# Patient Record
Sex: Female | Born: 1950
Health system: Southern US, Community
[De-identification: ages and names within clinical notes are randomized; demographics above are authoritative.]

## PROBLEM LIST (undated history)

## (undated) DIAGNOSIS — E079 Disorder of thyroid, unspecified: Secondary | ICD-10-CM

## (undated) DIAGNOSIS — J45909 Unspecified asthma, uncomplicated: Secondary | ICD-10-CM

## (undated) DIAGNOSIS — G4730 Sleep apnea, unspecified: Secondary | ICD-10-CM

## (undated) DIAGNOSIS — E119 Type 2 diabetes mellitus without complications: Secondary | ICD-10-CM

## (undated) DIAGNOSIS — E785 Hyperlipidemia, unspecified: Secondary | ICD-10-CM

## (undated) DIAGNOSIS — Z8742 Personal history of other diseases of the female genital tract: Secondary | ICD-10-CM

## (undated) DIAGNOSIS — L409 Psoriasis, unspecified: Secondary | ICD-10-CM

## (undated) DIAGNOSIS — E559 Vitamin D deficiency, unspecified: Secondary | ICD-10-CM

## (undated) DIAGNOSIS — I10 Essential (primary) hypertension: Secondary | ICD-10-CM

## (undated) DIAGNOSIS — E8881 Metabolic syndrome: Secondary | ICD-10-CM

## (undated) HISTORY — PX: BREAST SURGERY: SHX581

## (undated) HISTORY — DX: Personal history of other diseases of the female genital tract: Z87.42

## (undated) HISTORY — DX: Metabolic syndrome: E88.810

## (undated) HISTORY — DX: Sleep apnea, unspecified: G47.30

## (undated) HISTORY — DX: Psoriasis, unspecified: L40.9

## (undated) HISTORY — DX: Disorder of thyroid, unspecified: E07.9

## (undated) HISTORY — DX: Vitamin D deficiency, unspecified: E55.9

## (undated) HISTORY — DX: Type 2 diabetes mellitus without complications: E11.9

## (undated) HISTORY — DX: Essential (primary) hypertension: I10

## (undated) HISTORY — DX: Hyperlipidemia, unspecified: E78.5

## (undated) HISTORY — PX: ABDOMINAL HYSTERECTOMY: SHX81

## (undated) HISTORY — DX: Unspecified asthma, uncomplicated: J45.909

## (undated) HISTORY — PX: HYSTERECTOMY ABDOMINAL WITH SALPINGECTOMY: SHX6725

---

## 1978-03-02 HISTORY — PX: OOPHORECTOMY: SHX86

## 1997-05-31 ENCOUNTER — Other Ambulatory Visit: Admission: RE | Admit: 1997-05-31 | Discharge: 1997-05-31 | Payer: Self-pay | Admitting: Obstetrics & Gynecology

## 2000-07-21 ENCOUNTER — Encounter: Admission: RE | Admit: 2000-07-21 | Discharge: 2000-07-21 | Payer: Self-pay | Admitting: Obstetrics & Gynecology

## 2000-07-21 ENCOUNTER — Encounter: Payer: Self-pay | Admitting: Obstetrics & Gynecology

## 2000-07-30 ENCOUNTER — Other Ambulatory Visit: Admission: RE | Admit: 2000-07-30 | Discharge: 2000-07-30 | Payer: Self-pay | Admitting: Obstetrics & Gynecology

## 2002-05-08 ENCOUNTER — Other Ambulatory Visit: Admission: RE | Admit: 2002-05-08 | Discharge: 2002-05-08 | Payer: Self-pay | Admitting: Obstetrics & Gynecology

## 2003-11-16 ENCOUNTER — Other Ambulatory Visit: Admission: RE | Admit: 2003-11-16 | Discharge: 2003-11-16 | Payer: Self-pay | Admitting: Obstetrics & Gynecology

## 2005-03-17 ENCOUNTER — Other Ambulatory Visit: Admission: RE | Admit: 2005-03-17 | Discharge: 2005-03-17 | Payer: Self-pay | Admitting: Obstetrics & Gynecology

## 2009-03-02 HISTORY — PX: CHOLECYSTECTOMY: SHX55

## 2010-03-02 HISTORY — PX: URETHRAL DILATION: SUR417

## 2011-05-27 HISTORY — PX: COLONOSCOPY: SHX174

## 2011-05-27 LAB — HM COLONOSCOPY

## 2015-05-21 DIAGNOSIS — M6701 Short Achilles tendon (acquired), right ankle: Secondary | ICD-10-CM | POA: Insufficient documentation

## 2015-05-21 DIAGNOSIS — M7731 Calcaneal spur, right foot: Secondary | ICD-10-CM | POA: Insufficient documentation

## 2015-05-21 DIAGNOSIS — M7661 Achilles tendinitis, right leg: Secondary | ICD-10-CM | POA: Insufficient documentation

## 2016-02-07 DIAGNOSIS — Z6841 Body Mass Index (BMI) 40.0 and over, adult: Secondary | ICD-10-CM | POA: Insufficient documentation

## 2016-02-07 DIAGNOSIS — R0609 Other forms of dyspnea: Secondary | ICD-10-CM | POA: Insufficient documentation

## 2016-02-07 DIAGNOSIS — R0600 Dyspnea, unspecified: Secondary | ICD-10-CM | POA: Insufficient documentation

## 2016-02-07 DIAGNOSIS — R7303 Prediabetes: Secondary | ICD-10-CM | POA: Insufficient documentation

## 2016-02-07 DIAGNOSIS — G4733 Obstructive sleep apnea (adult) (pediatric): Secondary | ICD-10-CM | POA: Insufficient documentation

## 2016-03-12 DIAGNOSIS — M5137 Other intervertebral disc degeneration, lumbosacral region: Secondary | ICD-10-CM | POA: Diagnosis not present

## 2016-03-12 DIAGNOSIS — M5442 Lumbago with sciatica, left side: Secondary | ICD-10-CM | POA: Diagnosis not present

## 2016-03-12 DIAGNOSIS — M9904 Segmental and somatic dysfunction of sacral region: Secondary | ICD-10-CM | POA: Diagnosis not present

## 2016-03-12 DIAGNOSIS — M546 Pain in thoracic spine: Secondary | ICD-10-CM | POA: Diagnosis not present

## 2016-03-12 DIAGNOSIS — M9903 Segmental and somatic dysfunction of lumbar region: Secondary | ICD-10-CM | POA: Diagnosis not present

## 2016-03-12 DIAGNOSIS — M62830 Muscle spasm of back: Secondary | ICD-10-CM | POA: Diagnosis not present

## 2016-03-12 DIAGNOSIS — M5136 Other intervertebral disc degeneration, lumbar region: Secondary | ICD-10-CM | POA: Diagnosis not present

## 2016-03-12 DIAGNOSIS — M9902 Segmental and somatic dysfunction of thoracic region: Secondary | ICD-10-CM | POA: Diagnosis not present

## 2016-03-17 DIAGNOSIS — Z6841 Body Mass Index (BMI) 40.0 and over, adult: Secondary | ICD-10-CM | POA: Diagnosis not present

## 2016-03-17 DIAGNOSIS — R7303 Prediabetes: Secondary | ICD-10-CM | POA: Diagnosis not present

## 2016-03-17 DIAGNOSIS — G4733 Obstructive sleep apnea (adult) (pediatric): Secondary | ICD-10-CM | POA: Diagnosis not present

## 2016-03-17 DIAGNOSIS — R0609 Other forms of dyspnea: Secondary | ICD-10-CM | POA: Diagnosis not present

## 2016-03-26 DIAGNOSIS — M5442 Lumbago with sciatica, left side: Secondary | ICD-10-CM | POA: Diagnosis not present

## 2016-03-26 DIAGNOSIS — M62830 Muscle spasm of back: Secondary | ICD-10-CM | POA: Diagnosis not present

## 2016-03-26 DIAGNOSIS — M9903 Segmental and somatic dysfunction of lumbar region: Secondary | ICD-10-CM | POA: Diagnosis not present

## 2016-03-26 DIAGNOSIS — M546 Pain in thoracic spine: Secondary | ICD-10-CM | POA: Diagnosis not present

## 2016-03-26 DIAGNOSIS — M5136 Other intervertebral disc degeneration, lumbar region: Secondary | ICD-10-CM | POA: Diagnosis not present

## 2016-03-26 DIAGNOSIS — M9904 Segmental and somatic dysfunction of sacral region: Secondary | ICD-10-CM | POA: Diagnosis not present

## 2016-03-26 DIAGNOSIS — M9902 Segmental and somatic dysfunction of thoracic region: Secondary | ICD-10-CM | POA: Diagnosis not present

## 2016-03-26 DIAGNOSIS — M5137 Other intervertebral disc degeneration, lumbosacral region: Secondary | ICD-10-CM | POA: Diagnosis not present

## 2016-04-20 DIAGNOSIS — M9904 Segmental and somatic dysfunction of sacral region: Secondary | ICD-10-CM | POA: Diagnosis not present

## 2016-04-20 DIAGNOSIS — M62830 Muscle spasm of back: Secondary | ICD-10-CM | POA: Diagnosis not present

## 2016-04-20 DIAGNOSIS — M9902 Segmental and somatic dysfunction of thoracic region: Secondary | ICD-10-CM | POA: Diagnosis not present

## 2016-04-20 DIAGNOSIS — M5136 Other intervertebral disc degeneration, lumbar region: Secondary | ICD-10-CM | POA: Diagnosis not present

## 2016-04-20 DIAGNOSIS — M9903 Segmental and somatic dysfunction of lumbar region: Secondary | ICD-10-CM | POA: Diagnosis not present

## 2016-04-20 DIAGNOSIS — M5442 Lumbago with sciatica, left side: Secondary | ICD-10-CM | POA: Diagnosis not present

## 2016-04-20 DIAGNOSIS — M546 Pain in thoracic spine: Secondary | ICD-10-CM | POA: Diagnosis not present

## 2016-04-20 DIAGNOSIS — M5137 Other intervertebral disc degeneration, lumbosacral region: Secondary | ICD-10-CM | POA: Diagnosis not present

## 2016-04-27 DIAGNOSIS — M9902 Segmental and somatic dysfunction of thoracic region: Secondary | ICD-10-CM | POA: Diagnosis not present

## 2016-04-27 DIAGNOSIS — M9904 Segmental and somatic dysfunction of sacral region: Secondary | ICD-10-CM | POA: Diagnosis not present

## 2016-04-27 DIAGNOSIS — M5442 Lumbago with sciatica, left side: Secondary | ICD-10-CM | POA: Diagnosis not present

## 2016-04-27 DIAGNOSIS — M62830 Muscle spasm of back: Secondary | ICD-10-CM | POA: Diagnosis not present

## 2016-04-27 DIAGNOSIS — M5137 Other intervertebral disc degeneration, lumbosacral region: Secondary | ICD-10-CM | POA: Diagnosis not present

## 2016-04-27 DIAGNOSIS — M5136 Other intervertebral disc degeneration, lumbar region: Secondary | ICD-10-CM | POA: Diagnosis not present

## 2016-04-27 DIAGNOSIS — M9903 Segmental and somatic dysfunction of lumbar region: Secondary | ICD-10-CM | POA: Diagnosis not present

## 2016-04-27 DIAGNOSIS — M546 Pain in thoracic spine: Secondary | ICD-10-CM | POA: Diagnosis not present

## 2016-04-30 DIAGNOSIS — M5136 Other intervertebral disc degeneration, lumbar region: Secondary | ICD-10-CM | POA: Diagnosis not present

## 2016-04-30 DIAGNOSIS — M9903 Segmental and somatic dysfunction of lumbar region: Secondary | ICD-10-CM | POA: Diagnosis not present

## 2016-04-30 DIAGNOSIS — M9902 Segmental and somatic dysfunction of thoracic region: Secondary | ICD-10-CM | POA: Diagnosis not present

## 2016-04-30 DIAGNOSIS — M9904 Segmental and somatic dysfunction of sacral region: Secondary | ICD-10-CM | POA: Diagnosis not present

## 2016-04-30 DIAGNOSIS — M62830 Muscle spasm of back: Secondary | ICD-10-CM | POA: Diagnosis not present

## 2016-04-30 DIAGNOSIS — M546 Pain in thoracic spine: Secondary | ICD-10-CM | POA: Diagnosis not present

## 2016-04-30 DIAGNOSIS — M5137 Other intervertebral disc degeneration, lumbosacral region: Secondary | ICD-10-CM | POA: Diagnosis not present

## 2016-04-30 DIAGNOSIS — M5442 Lumbago with sciatica, left side: Secondary | ICD-10-CM | POA: Diagnosis not present

## 2016-06-03 DIAGNOSIS — N318 Other neuromuscular dysfunction of bladder: Secondary | ICD-10-CM | POA: Diagnosis not present

## 2016-06-03 DIAGNOSIS — N358 Other urethral stricture: Secondary | ICD-10-CM | POA: Diagnosis not present

## 2016-06-03 DIAGNOSIS — N3020 Other chronic cystitis without hematuria: Secondary | ICD-10-CM | POA: Diagnosis not present

## 2016-06-05 DIAGNOSIS — E559 Vitamin D deficiency, unspecified: Secondary | ICD-10-CM | POA: Diagnosis not present

## 2016-06-05 DIAGNOSIS — L408 Other psoriasis: Secondary | ICD-10-CM | POA: Diagnosis not present

## 2016-06-05 DIAGNOSIS — G4733 Obstructive sleep apnea (adult) (pediatric): Secondary | ICD-10-CM | POA: Diagnosis not present

## 2016-06-05 DIAGNOSIS — E038 Other specified hypothyroidism: Secondary | ICD-10-CM | POA: Diagnosis not present

## 2016-06-05 DIAGNOSIS — I10 Essential (primary) hypertension: Secondary | ICD-10-CM | POA: Diagnosis not present

## 2016-06-05 DIAGNOSIS — Z6841 Body Mass Index (BMI) 40.0 and over, adult: Secondary | ICD-10-CM | POA: Diagnosis not present

## 2016-06-05 DIAGNOSIS — J4520 Mild intermittent asthma, uncomplicated: Secondary | ICD-10-CM | POA: Diagnosis not present

## 2016-06-05 DIAGNOSIS — E119 Type 2 diabetes mellitus without complications: Secondary | ICD-10-CM | POA: Diagnosis not present

## 2016-06-05 DIAGNOSIS — E782 Mixed hyperlipidemia: Secondary | ICD-10-CM | POA: Diagnosis not present

## 2016-06-17 DIAGNOSIS — R3911 Hesitancy of micturition: Secondary | ICD-10-CM | POA: Diagnosis not present

## 2016-06-17 DIAGNOSIS — N3000 Acute cystitis without hematuria: Secondary | ICD-10-CM | POA: Diagnosis not present

## 2016-07-09 DIAGNOSIS — M62830 Muscle spasm of back: Secondary | ICD-10-CM | POA: Diagnosis not present

## 2016-07-09 DIAGNOSIS — M5136 Other intervertebral disc degeneration, lumbar region: Secondary | ICD-10-CM | POA: Diagnosis not present

## 2016-07-09 DIAGNOSIS — M9903 Segmental and somatic dysfunction of lumbar region: Secondary | ICD-10-CM | POA: Diagnosis not present

## 2016-07-09 DIAGNOSIS — M5137 Other intervertebral disc degeneration, lumbosacral region: Secondary | ICD-10-CM | POA: Diagnosis not present

## 2016-07-09 DIAGNOSIS — M546 Pain in thoracic spine: Secondary | ICD-10-CM | POA: Diagnosis not present

## 2016-07-09 DIAGNOSIS — M5442 Lumbago with sciatica, left side: Secondary | ICD-10-CM | POA: Diagnosis not present

## 2016-07-09 DIAGNOSIS — M9902 Segmental and somatic dysfunction of thoracic region: Secondary | ICD-10-CM | POA: Diagnosis not present

## 2016-07-09 DIAGNOSIS — M9904 Segmental and somatic dysfunction of sacral region: Secondary | ICD-10-CM | POA: Diagnosis not present

## 2016-07-16 DIAGNOSIS — N358 Other urethral stricture: Secondary | ICD-10-CM | POA: Diagnosis not present

## 2016-07-16 DIAGNOSIS — N312 Flaccid neuropathic bladder, not elsewhere classified: Secondary | ICD-10-CM | POA: Diagnosis not present

## 2016-07-16 DIAGNOSIS — N3020 Other chronic cystitis without hematuria: Secondary | ICD-10-CM | POA: Diagnosis not present

## 2016-07-19 DIAGNOSIS — J0100 Acute maxillary sinusitis, unspecified: Secondary | ICD-10-CM | POA: Diagnosis not present

## 2016-08-01 DIAGNOSIS — G4733 Obstructive sleep apnea (adult) (pediatric): Secondary | ICD-10-CM | POA: Diagnosis not present

## 2016-08-05 DIAGNOSIS — Z0000 Encounter for general adult medical examination without abnormal findings: Secondary | ICD-10-CM | POA: Diagnosis not present

## 2016-08-05 DIAGNOSIS — Z6841 Body Mass Index (BMI) 40.0 and over, adult: Secondary | ICD-10-CM | POA: Diagnosis not present

## 2016-08-05 DIAGNOSIS — Z23 Encounter for immunization: Secondary | ICD-10-CM | POA: Diagnosis not present

## 2016-08-05 DIAGNOSIS — E663 Overweight: Secondary | ICD-10-CM | POA: Diagnosis not present

## 2016-10-08 DIAGNOSIS — E559 Vitamin D deficiency, unspecified: Secondary | ICD-10-CM | POA: Diagnosis not present

## 2016-10-08 DIAGNOSIS — Z6841 Body Mass Index (BMI) 40.0 and over, adult: Secondary | ICD-10-CM | POA: Diagnosis not present

## 2016-10-08 DIAGNOSIS — I10 Essential (primary) hypertension: Secondary | ICD-10-CM | POA: Diagnosis not present

## 2016-10-08 DIAGNOSIS — E782 Mixed hyperlipidemia: Secondary | ICD-10-CM | POA: Diagnosis not present

## 2016-10-08 DIAGNOSIS — L408 Other psoriasis: Secondary | ICD-10-CM | POA: Diagnosis not present

## 2016-10-08 DIAGNOSIS — G4733 Obstructive sleep apnea (adult) (pediatric): Secondary | ICD-10-CM | POA: Diagnosis not present

## 2016-10-08 DIAGNOSIS — J4540 Moderate persistent asthma, uncomplicated: Secondary | ICD-10-CM | POA: Diagnosis not present

## 2016-10-08 DIAGNOSIS — E119 Type 2 diabetes mellitus without complications: Secondary | ICD-10-CM | POA: Diagnosis not present

## 2016-10-08 DIAGNOSIS — E038 Other specified hypothyroidism: Secondary | ICD-10-CM | POA: Diagnosis not present

## 2017-01-15 DIAGNOSIS — E782 Mixed hyperlipidemia: Secondary | ICD-10-CM | POA: Diagnosis not present

## 2017-01-15 DIAGNOSIS — I10 Essential (primary) hypertension: Secondary | ICD-10-CM | POA: Diagnosis not present

## 2017-01-15 DIAGNOSIS — G4733 Obstructive sleep apnea (adult) (pediatric): Secondary | ICD-10-CM | POA: Diagnosis not present

## 2017-01-15 DIAGNOSIS — Z23 Encounter for immunization: Secondary | ICD-10-CM | POA: Diagnosis not present

## 2017-01-15 DIAGNOSIS — L408 Other psoriasis: Secondary | ICD-10-CM | POA: Diagnosis not present

## 2017-01-15 DIAGNOSIS — Z6841 Body Mass Index (BMI) 40.0 and over, adult: Secondary | ICD-10-CM | POA: Diagnosis not present

## 2017-01-15 DIAGNOSIS — N958 Other specified menopausal and perimenopausal disorders: Secondary | ICD-10-CM | POA: Diagnosis not present

## 2017-01-15 DIAGNOSIS — E119 Type 2 diabetes mellitus without complications: Secondary | ICD-10-CM | POA: Diagnosis not present

## 2017-01-15 DIAGNOSIS — E559 Vitamin D deficiency, unspecified: Secondary | ICD-10-CM | POA: Diagnosis not present

## 2017-01-15 DIAGNOSIS — E038 Other specified hypothyroidism: Secondary | ICD-10-CM | POA: Diagnosis not present

## 2017-01-15 DIAGNOSIS — J4540 Moderate persistent asthma, uncomplicated: Secondary | ICD-10-CM | POA: Diagnosis not present

## 2017-01-25 DIAGNOSIS — R7303 Prediabetes: Secondary | ICD-10-CM | POA: Diagnosis not present

## 2017-01-25 DIAGNOSIS — H35372 Puckering of macula, left eye: Secondary | ICD-10-CM | POA: Diagnosis not present

## 2017-01-25 DIAGNOSIS — H2510 Age-related nuclear cataract, unspecified eye: Secondary | ICD-10-CM | POA: Diagnosis not present

## 2017-02-17 DIAGNOSIS — L4050 Arthropathic psoriasis, unspecified: Secondary | ICD-10-CM | POA: Diagnosis not present

## 2017-02-17 DIAGNOSIS — L400 Psoriasis vulgaris: Secondary | ICD-10-CM | POA: Diagnosis not present

## 2017-02-17 DIAGNOSIS — R531 Weakness: Secondary | ICD-10-CM | POA: Diagnosis not present

## 2017-02-17 DIAGNOSIS — L299 Pruritus, unspecified: Secondary | ICD-10-CM | POA: Diagnosis not present

## 2017-02-26 DIAGNOSIS — N958 Other specified menopausal and perimenopausal disorders: Secondary | ICD-10-CM | POA: Diagnosis not present

## 2017-02-26 DIAGNOSIS — Z1231 Encounter for screening mammogram for malignant neoplasm of breast: Secondary | ICD-10-CM | POA: Diagnosis not present

## 2017-02-26 DIAGNOSIS — M859 Disorder of bone density and structure, unspecified: Secondary | ICD-10-CM | POA: Diagnosis not present

## 2017-02-26 LAB — HM DEXA SCAN: HM Dexa Scan: NORMAL

## 2017-05-10 DIAGNOSIS — I10 Essential (primary) hypertension: Secondary | ICD-10-CM | POA: Diagnosis not present

## 2017-05-10 DIAGNOSIS — E559 Vitamin D deficiency, unspecified: Secondary | ICD-10-CM | POA: Diagnosis not present

## 2017-05-10 DIAGNOSIS — Z6841 Body Mass Index (BMI) 40.0 and over, adult: Secondary | ICD-10-CM | POA: Diagnosis not present

## 2017-05-10 DIAGNOSIS — L408 Other psoriasis: Secondary | ICD-10-CM | POA: Diagnosis not present

## 2017-05-10 DIAGNOSIS — E782 Mixed hyperlipidemia: Secondary | ICD-10-CM | POA: Diagnosis not present

## 2017-05-10 DIAGNOSIS — E119 Type 2 diabetes mellitus without complications: Secondary | ICD-10-CM | POA: Diagnosis not present

## 2017-05-10 DIAGNOSIS — F5101 Primary insomnia: Secondary | ICD-10-CM | POA: Diagnosis not present

## 2017-05-10 DIAGNOSIS — G4733 Obstructive sleep apnea (adult) (pediatric): Secondary | ICD-10-CM | POA: Diagnosis not present

## 2017-05-10 DIAGNOSIS — E038 Other specified hypothyroidism: Secondary | ICD-10-CM | POA: Diagnosis not present

## 2017-05-10 DIAGNOSIS — J4530 Mild persistent asthma, uncomplicated: Secondary | ICD-10-CM | POA: Diagnosis not present

## 2017-05-24 DIAGNOSIS — M62830 Muscle spasm of back: Secondary | ICD-10-CM | POA: Diagnosis not present

## 2017-05-24 DIAGNOSIS — M5442 Lumbago with sciatica, left side: Secondary | ICD-10-CM | POA: Diagnosis not present

## 2017-05-24 DIAGNOSIS — M5136 Other intervertebral disc degeneration, lumbar region: Secondary | ICD-10-CM | POA: Diagnosis not present

## 2017-05-24 DIAGNOSIS — M9904 Segmental and somatic dysfunction of sacral region: Secondary | ICD-10-CM | POA: Diagnosis not present

## 2017-05-24 DIAGNOSIS — M5137 Other intervertebral disc degeneration, lumbosacral region: Secondary | ICD-10-CM | POA: Diagnosis not present

## 2017-05-24 DIAGNOSIS — M9902 Segmental and somatic dysfunction of thoracic region: Secondary | ICD-10-CM | POA: Diagnosis not present

## 2017-05-24 DIAGNOSIS — M546 Pain in thoracic spine: Secondary | ICD-10-CM | POA: Diagnosis not present

## 2017-05-24 DIAGNOSIS — M9903 Segmental and somatic dysfunction of lumbar region: Secondary | ICD-10-CM | POA: Diagnosis not present

## 2017-05-26 DIAGNOSIS — L400 Psoriasis vulgaris: Secondary | ICD-10-CM | POA: Diagnosis not present

## 2017-05-26 DIAGNOSIS — L4050 Arthropathic psoriasis, unspecified: Secondary | ICD-10-CM | POA: Diagnosis not present

## 2017-05-27 DIAGNOSIS — M25552 Pain in left hip: Secondary | ICD-10-CM | POA: Diagnosis not present

## 2017-05-31 DIAGNOSIS — M9904 Segmental and somatic dysfunction of sacral region: Secondary | ICD-10-CM | POA: Diagnosis not present

## 2017-05-31 DIAGNOSIS — M62830 Muscle spasm of back: Secondary | ICD-10-CM | POA: Diagnosis not present

## 2017-05-31 DIAGNOSIS — M9902 Segmental and somatic dysfunction of thoracic region: Secondary | ICD-10-CM | POA: Diagnosis not present

## 2017-05-31 DIAGNOSIS — M5137 Other intervertebral disc degeneration, lumbosacral region: Secondary | ICD-10-CM | POA: Diagnosis not present

## 2017-05-31 DIAGNOSIS — M5136 Other intervertebral disc degeneration, lumbar region: Secondary | ICD-10-CM | POA: Diagnosis not present

## 2017-05-31 DIAGNOSIS — M9903 Segmental and somatic dysfunction of lumbar region: Secondary | ICD-10-CM | POA: Diagnosis not present

## 2017-05-31 DIAGNOSIS — M5442 Lumbago with sciatica, left side: Secondary | ICD-10-CM | POA: Diagnosis not present

## 2017-05-31 DIAGNOSIS — M546 Pain in thoracic spine: Secondary | ICD-10-CM | POA: Diagnosis not present

## 2017-06-03 DIAGNOSIS — M546 Pain in thoracic spine: Secondary | ICD-10-CM | POA: Diagnosis not present

## 2017-06-03 DIAGNOSIS — M5442 Lumbago with sciatica, left side: Secondary | ICD-10-CM | POA: Diagnosis not present

## 2017-06-03 DIAGNOSIS — M5137 Other intervertebral disc degeneration, lumbosacral region: Secondary | ICD-10-CM | POA: Diagnosis not present

## 2017-06-03 DIAGNOSIS — M9904 Segmental and somatic dysfunction of sacral region: Secondary | ICD-10-CM | POA: Diagnosis not present

## 2017-06-03 DIAGNOSIS — M5136 Other intervertebral disc degeneration, lumbar region: Secondary | ICD-10-CM | POA: Diagnosis not present

## 2017-06-03 DIAGNOSIS — M9902 Segmental and somatic dysfunction of thoracic region: Secondary | ICD-10-CM | POA: Diagnosis not present

## 2017-06-03 DIAGNOSIS — M9903 Segmental and somatic dysfunction of lumbar region: Secondary | ICD-10-CM | POA: Diagnosis not present

## 2017-06-03 DIAGNOSIS — M62830 Muscle spasm of back: Secondary | ICD-10-CM | POA: Diagnosis not present

## 2017-06-07 DIAGNOSIS — M546 Pain in thoracic spine: Secondary | ICD-10-CM | POA: Diagnosis not present

## 2017-06-07 DIAGNOSIS — M5442 Lumbago with sciatica, left side: Secondary | ICD-10-CM | POA: Diagnosis not present

## 2017-06-07 DIAGNOSIS — M62830 Muscle spasm of back: Secondary | ICD-10-CM | POA: Diagnosis not present

## 2017-06-07 DIAGNOSIS — M5137 Other intervertebral disc degeneration, lumbosacral region: Secondary | ICD-10-CM | POA: Diagnosis not present

## 2017-06-07 DIAGNOSIS — M9903 Segmental and somatic dysfunction of lumbar region: Secondary | ICD-10-CM | POA: Diagnosis not present

## 2017-06-07 DIAGNOSIS — M9904 Segmental and somatic dysfunction of sacral region: Secondary | ICD-10-CM | POA: Diagnosis not present

## 2017-06-07 DIAGNOSIS — M9902 Segmental and somatic dysfunction of thoracic region: Secondary | ICD-10-CM | POA: Diagnosis not present

## 2017-06-07 DIAGNOSIS — M5136 Other intervertebral disc degeneration, lumbar region: Secondary | ICD-10-CM | POA: Diagnosis not present

## 2017-06-23 DIAGNOSIS — L400 Psoriasis vulgaris: Secondary | ICD-10-CM | POA: Diagnosis not present

## 2017-06-23 DIAGNOSIS — L4050 Arthropathic psoriasis, unspecified: Secondary | ICD-10-CM | POA: Diagnosis not present

## 2017-07-02 DIAGNOSIS — J208 Acute bronchitis due to other specified organisms: Secondary | ICD-10-CM | POA: Diagnosis not present

## 2017-07-02 DIAGNOSIS — J4521 Mild intermittent asthma with (acute) exacerbation: Secondary | ICD-10-CM | POA: Diagnosis not present

## 2017-08-11 DIAGNOSIS — J4521 Mild intermittent asthma with (acute) exacerbation: Secondary | ICD-10-CM | POA: Diagnosis not present

## 2017-08-11 DIAGNOSIS — J208 Acute bronchitis due to other specified organisms: Secondary | ICD-10-CM | POA: Diagnosis not present

## 2017-09-14 DIAGNOSIS — J4530 Mild persistent asthma, uncomplicated: Secondary | ICD-10-CM | POA: Diagnosis not present

## 2017-09-14 DIAGNOSIS — E1169 Type 2 diabetes mellitus with other specified complication: Secondary | ICD-10-CM | POA: Diagnosis not present

## 2017-09-14 DIAGNOSIS — E038 Other specified hypothyroidism: Secondary | ICD-10-CM | POA: Diagnosis not present

## 2017-09-14 DIAGNOSIS — G4733 Obstructive sleep apnea (adult) (pediatric): Secondary | ICD-10-CM | POA: Diagnosis not present

## 2017-09-14 DIAGNOSIS — E559 Vitamin D deficiency, unspecified: Secondary | ICD-10-CM | POA: Diagnosis not present

## 2017-09-14 DIAGNOSIS — I10 Essential (primary) hypertension: Secondary | ICD-10-CM | POA: Diagnosis not present

## 2017-09-14 DIAGNOSIS — L4059 Other psoriatic arthropathy: Secondary | ICD-10-CM | POA: Diagnosis not present

## 2017-09-14 DIAGNOSIS — E782 Mixed hyperlipidemia: Secondary | ICD-10-CM | POA: Diagnosis not present

## 2017-09-14 DIAGNOSIS — F5101 Primary insomnia: Secondary | ICD-10-CM | POA: Diagnosis not present

## 2017-09-16 DIAGNOSIS — L400 Psoriasis vulgaris: Secondary | ICD-10-CM | POA: Diagnosis not present

## 2017-09-16 DIAGNOSIS — L4050 Arthropathic psoriasis, unspecified: Secondary | ICD-10-CM | POA: Diagnosis not present

## 2017-10-12 DIAGNOSIS — N3090 Cystitis, unspecified without hematuria: Secondary | ICD-10-CM | POA: Diagnosis not present

## 2017-10-12 DIAGNOSIS — N3592 Unspecified urethral stricture, female: Secondary | ICD-10-CM | POA: Diagnosis not present

## 2017-10-12 DIAGNOSIS — N318 Other neuromuscular dysfunction of bladder: Secondary | ICD-10-CM | POA: Diagnosis not present

## 2017-10-12 DIAGNOSIS — N3020 Other chronic cystitis without hematuria: Secondary | ICD-10-CM | POA: Diagnosis not present

## 2017-11-25 DIAGNOSIS — J4531 Mild persistent asthma with (acute) exacerbation: Secondary | ICD-10-CM | POA: Diagnosis not present

## 2017-11-25 DIAGNOSIS — J3089 Other allergic rhinitis: Secondary | ICD-10-CM | POA: Diagnosis not present

## 2017-12-13 DIAGNOSIS — L720 Epidermal cyst: Secondary | ICD-10-CM | POA: Diagnosis not present

## 2017-12-13 DIAGNOSIS — L4050 Arthropathic psoriasis, unspecified: Secondary | ICD-10-CM | POA: Diagnosis not present

## 2017-12-13 DIAGNOSIS — D485 Neoplasm of uncertain behavior of skin: Secondary | ICD-10-CM | POA: Diagnosis not present

## 2017-12-13 DIAGNOSIS — L400 Psoriasis vulgaris: Secondary | ICD-10-CM | POA: Diagnosis not present

## 2017-12-16 DIAGNOSIS — E038 Other specified hypothyroidism: Secondary | ICD-10-CM | POA: Diagnosis not present

## 2017-12-16 DIAGNOSIS — E785 Hyperlipidemia, unspecified: Secondary | ICD-10-CM | POA: Diagnosis not present

## 2017-12-16 DIAGNOSIS — J4530 Mild persistent asthma, uncomplicated: Secondary | ICD-10-CM | POA: Diagnosis not present

## 2017-12-16 DIAGNOSIS — E782 Mixed hyperlipidemia: Secondary | ICD-10-CM | POA: Diagnosis not present

## 2017-12-16 DIAGNOSIS — F5101 Primary insomnia: Secondary | ICD-10-CM | POA: Diagnosis not present

## 2017-12-16 DIAGNOSIS — L400 Psoriasis vulgaris: Secondary | ICD-10-CM | POA: Diagnosis not present

## 2017-12-16 DIAGNOSIS — G4733 Obstructive sleep apnea (adult) (pediatric): Secondary | ICD-10-CM | POA: Diagnosis not present

## 2017-12-16 DIAGNOSIS — I10 Essential (primary) hypertension: Secondary | ICD-10-CM | POA: Diagnosis not present

## 2017-12-16 DIAGNOSIS — R531 Weakness: Secondary | ICD-10-CM | POA: Diagnosis not present

## 2017-12-16 DIAGNOSIS — Z1231 Encounter for screening mammogram for malignant neoplasm of breast: Secondary | ICD-10-CM | POA: Diagnosis not present

## 2017-12-16 DIAGNOSIS — Z23 Encounter for immunization: Secondary | ICD-10-CM | POA: Diagnosis not present

## 2017-12-16 DIAGNOSIS — Z6841 Body Mass Index (BMI) 40.0 and over, adult: Secondary | ICD-10-CM | POA: Diagnosis not present

## 2017-12-16 DIAGNOSIS — E1169 Type 2 diabetes mellitus with other specified complication: Secondary | ICD-10-CM | POA: Diagnosis not present

## 2017-12-26 DIAGNOSIS — J324 Chronic pansinusitis: Secondary | ICD-10-CM | POA: Diagnosis not present

## 2018-01-06 DIAGNOSIS — I10 Essential (primary) hypertension: Secondary | ICD-10-CM | POA: Diagnosis not present

## 2018-01-06 DIAGNOSIS — J208 Acute bronchitis due to other specified organisms: Secondary | ICD-10-CM | POA: Diagnosis not present

## 2018-01-06 DIAGNOSIS — J4531 Mild persistent asthma with (acute) exacerbation: Secondary | ICD-10-CM | POA: Diagnosis not present

## 2018-01-18 ENCOUNTER — Other Ambulatory Visit: Payer: Self-pay

## 2018-02-03 DIAGNOSIS — H35372 Puckering of macula, left eye: Secondary | ICD-10-CM | POA: Diagnosis not present

## 2018-02-03 DIAGNOSIS — H43392 Other vitreous opacities, left eye: Secondary | ICD-10-CM | POA: Diagnosis not present

## 2018-02-03 DIAGNOSIS — H2510 Age-related nuclear cataract, unspecified eye: Secondary | ICD-10-CM | POA: Diagnosis not present

## 2018-02-14 DIAGNOSIS — L400 Psoriasis vulgaris: Secondary | ICD-10-CM | POA: Diagnosis not present

## 2018-02-14 DIAGNOSIS — L4050 Arthropathic psoriasis, unspecified: Secondary | ICD-10-CM | POA: Diagnosis not present

## 2018-02-14 DIAGNOSIS — D485 Neoplasm of uncertain behavior of skin: Secondary | ICD-10-CM | POA: Diagnosis not present

## 2018-02-16 ENCOUNTER — Ambulatory Visit: Payer: Self-pay | Admitting: Cardiology

## 2018-02-17 DIAGNOSIS — E663 Overweight: Secondary | ICD-10-CM | POA: Diagnosis not present

## 2018-02-17 DIAGNOSIS — Z0000 Encounter for general adult medical examination without abnormal findings: Secondary | ICD-10-CM | POA: Diagnosis not present

## 2018-02-17 DIAGNOSIS — Z6841 Body Mass Index (BMI) 40.0 and over, adult: Secondary | ICD-10-CM | POA: Diagnosis not present

## 2018-02-28 DIAGNOSIS — Z1231 Encounter for screening mammogram for malignant neoplasm of breast: Secondary | ICD-10-CM | POA: Diagnosis not present

## 2018-02-28 LAB — HM MAMMOGRAPHY: HM Mammogram: NORMAL (ref 0–4)

## 2018-03-22 DIAGNOSIS — I10 Essential (primary) hypertension: Secondary | ICD-10-CM | POA: Diagnosis not present

## 2018-03-22 DIAGNOSIS — E1169 Type 2 diabetes mellitus with other specified complication: Secondary | ICD-10-CM | POA: Diagnosis not present

## 2018-03-22 DIAGNOSIS — E559 Vitamin D deficiency, unspecified: Secondary | ICD-10-CM | POA: Diagnosis not present

## 2018-03-22 DIAGNOSIS — E038 Other specified hypothyroidism: Secondary | ICD-10-CM | POA: Diagnosis not present

## 2018-03-22 DIAGNOSIS — J4540 Moderate persistent asthma, uncomplicated: Secondary | ICD-10-CM | POA: Diagnosis not present

## 2018-03-22 DIAGNOSIS — E782 Mixed hyperlipidemia: Secondary | ICD-10-CM | POA: Diagnosis not present

## 2018-04-25 DIAGNOSIS — I10 Essential (primary) hypertension: Secondary | ICD-10-CM | POA: Diagnosis not present

## 2018-05-09 DIAGNOSIS — N3020 Other chronic cystitis without hematuria: Secondary | ICD-10-CM | POA: Diagnosis not present

## 2018-05-26 DIAGNOSIS — G4733 Obstructive sleep apnea (adult) (pediatric): Secondary | ICD-10-CM | POA: Diagnosis not present

## 2018-06-30 DIAGNOSIS — I10 Essential (primary) hypertension: Secondary | ICD-10-CM | POA: Diagnosis not present

## 2018-06-30 DIAGNOSIS — E1169 Type 2 diabetes mellitus with other specified complication: Secondary | ICD-10-CM | POA: Diagnosis not present

## 2018-06-30 DIAGNOSIS — J4540 Moderate persistent asthma, uncomplicated: Secondary | ICD-10-CM | POA: Diagnosis not present

## 2018-06-30 DIAGNOSIS — E038 Other specified hypothyroidism: Secondary | ICD-10-CM | POA: Diagnosis not present

## 2018-06-30 DIAGNOSIS — E782 Mixed hyperlipidemia: Secondary | ICD-10-CM | POA: Diagnosis not present

## 2018-08-25 DIAGNOSIS — G4733 Obstructive sleep apnea (adult) (pediatric): Secondary | ICD-10-CM | POA: Diagnosis not present

## 2018-10-06 DIAGNOSIS — E038 Other specified hypothyroidism: Secondary | ICD-10-CM | POA: Diagnosis not present

## 2018-10-06 DIAGNOSIS — I10 Essential (primary) hypertension: Secondary | ICD-10-CM | POA: Diagnosis not present

## 2018-10-06 DIAGNOSIS — E1169 Type 2 diabetes mellitus with other specified complication: Secondary | ICD-10-CM | POA: Diagnosis not present

## 2018-10-06 DIAGNOSIS — G4733 Obstructive sleep apnea (adult) (pediatric): Secondary | ICD-10-CM | POA: Diagnosis not present

## 2018-10-06 DIAGNOSIS — E782 Mixed hyperlipidemia: Secondary | ICD-10-CM | POA: Diagnosis not present

## 2018-10-06 DIAGNOSIS — J4540 Moderate persistent asthma, uncomplicated: Secondary | ICD-10-CM | POA: Diagnosis not present

## 2018-11-26 DIAGNOSIS — G4733 Obstructive sleep apnea (adult) (pediatric): Secondary | ICD-10-CM | POA: Diagnosis not present

## 2018-12-12 DIAGNOSIS — M545 Low back pain: Secondary | ICD-10-CM | POA: Diagnosis not present

## 2018-12-14 DIAGNOSIS — M5137 Other intervertebral disc degeneration, lumbosacral region: Secondary | ICD-10-CM | POA: Diagnosis not present

## 2018-12-14 DIAGNOSIS — M5136 Other intervertebral disc degeneration, lumbar region: Secondary | ICD-10-CM | POA: Diagnosis not present

## 2018-12-14 DIAGNOSIS — M5440 Lumbago with sciatica, unspecified side: Secondary | ICD-10-CM | POA: Diagnosis not present

## 2019-01-04 DIAGNOSIS — M5442 Lumbago with sciatica, left side: Secondary | ICD-10-CM | POA: Diagnosis not present

## 2019-01-04 DIAGNOSIS — M62830 Muscle spasm of back: Secondary | ICD-10-CM | POA: Diagnosis not present

## 2019-01-04 DIAGNOSIS — M546 Pain in thoracic spine: Secondary | ICD-10-CM | POA: Diagnosis not present

## 2019-01-04 DIAGNOSIS — M9904 Segmental and somatic dysfunction of sacral region: Secondary | ICD-10-CM | POA: Diagnosis not present

## 2019-01-04 DIAGNOSIS — M9902 Segmental and somatic dysfunction of thoracic region: Secondary | ICD-10-CM | POA: Diagnosis not present

## 2019-01-04 DIAGNOSIS — M9903 Segmental and somatic dysfunction of lumbar region: Secondary | ICD-10-CM | POA: Diagnosis not present

## 2019-01-04 DIAGNOSIS — M5137 Other intervertebral disc degeneration, lumbosacral region: Secondary | ICD-10-CM | POA: Diagnosis not present

## 2019-01-04 DIAGNOSIS — M5136 Other intervertebral disc degeneration, lumbar region: Secondary | ICD-10-CM | POA: Diagnosis not present

## 2019-01-06 DIAGNOSIS — M5442 Lumbago with sciatica, left side: Secondary | ICD-10-CM | POA: Diagnosis not present

## 2019-01-06 DIAGNOSIS — M5137 Other intervertebral disc degeneration, lumbosacral region: Secondary | ICD-10-CM | POA: Diagnosis not present

## 2019-01-06 DIAGNOSIS — M9903 Segmental and somatic dysfunction of lumbar region: Secondary | ICD-10-CM | POA: Diagnosis not present

## 2019-01-06 DIAGNOSIS — M5136 Other intervertebral disc degeneration, lumbar region: Secondary | ICD-10-CM | POA: Diagnosis not present

## 2019-01-06 DIAGNOSIS — M9904 Segmental and somatic dysfunction of sacral region: Secondary | ICD-10-CM | POA: Diagnosis not present

## 2019-01-09 ENCOUNTER — Other Ambulatory Visit: Payer: Self-pay

## 2019-01-09 DIAGNOSIS — E782 Mixed hyperlipidemia: Secondary | ICD-10-CM | POA: Diagnosis not present

## 2019-01-09 DIAGNOSIS — J4540 Moderate persistent asthma, uncomplicated: Secondary | ICD-10-CM | POA: Diagnosis not present

## 2019-01-09 DIAGNOSIS — E038 Other specified hypothyroidism: Secondary | ICD-10-CM | POA: Diagnosis not present

## 2019-01-09 DIAGNOSIS — G4733 Obstructive sleep apnea (adult) (pediatric): Secondary | ICD-10-CM | POA: Diagnosis not present

## 2019-01-09 DIAGNOSIS — I10 Essential (primary) hypertension: Secondary | ICD-10-CM | POA: Diagnosis not present

## 2019-01-09 NOTE — Patient Outreach (Signed)
[facility]) Care Management  01/09/2019  Whitney Eaton Sep 11, 1950 [id number]   Medication Adherence call to [name] HIPPA Compliant Voice message left with a call back number. [name] is showing past due on Rosuvastatin 5 mg and Metformin 500 mg under [place].   [place] Management Direct Dial [phone]  Fax [phone] [email or web address]

## 2019-01-17 DIAGNOSIS — M9904 Segmental and somatic dysfunction of sacral region: Secondary | ICD-10-CM | POA: Diagnosis not present

## 2019-01-17 DIAGNOSIS — M5442 Lumbago with sciatica, left side: Secondary | ICD-10-CM | POA: Diagnosis not present

## 2019-01-17 DIAGNOSIS — M5136 Other intervertebral disc degeneration, lumbar region: Secondary | ICD-10-CM | POA: Diagnosis not present

## 2019-01-17 DIAGNOSIS — M9903 Segmental and somatic dysfunction of lumbar region: Secondary | ICD-10-CM | POA: Diagnosis not present

## 2019-01-17 DIAGNOSIS — M5137 Other intervertebral disc degeneration, lumbosacral region: Secondary | ICD-10-CM | POA: Diagnosis not present

## 2019-01-18 DIAGNOSIS — E782 Mixed hyperlipidemia: Secondary | ICD-10-CM | POA: Diagnosis not present

## 2019-01-18 DIAGNOSIS — E038 Other specified hypothyroidism: Secondary | ICD-10-CM | POA: Diagnosis not present

## 2019-01-18 DIAGNOSIS — Z23 Encounter for immunization: Secondary | ICD-10-CM | POA: Diagnosis not present

## 2019-01-18 DIAGNOSIS — I10 Essential (primary) hypertension: Secondary | ICD-10-CM | POA: Diagnosis not present

## 2019-01-18 DIAGNOSIS — J4540 Moderate persistent asthma, uncomplicated: Secondary | ICD-10-CM | POA: Diagnosis not present

## 2019-01-18 DIAGNOSIS — E559 Vitamin D deficiency, unspecified: Secondary | ICD-10-CM | POA: Diagnosis not present

## 2019-01-18 DIAGNOSIS — E1121 Type 2 diabetes mellitus with diabetic nephropathy: Secondary | ICD-10-CM | POA: Diagnosis not present

## 2019-01-18 DIAGNOSIS — G4733 Obstructive sleep apnea (adult) (pediatric): Secondary | ICD-10-CM | POA: Diagnosis not present

## 2019-01-19 DIAGNOSIS — M9903 Segmental and somatic dysfunction of lumbar region: Secondary | ICD-10-CM | POA: Diagnosis not present

## 2019-01-19 DIAGNOSIS — M5442 Lumbago with sciatica, left side: Secondary | ICD-10-CM | POA: Diagnosis not present

## 2019-01-19 DIAGNOSIS — M5136 Other intervertebral disc degeneration, lumbar region: Secondary | ICD-10-CM | POA: Diagnosis not present

## 2019-01-19 DIAGNOSIS — M5137 Other intervertebral disc degeneration, lumbosacral region: Secondary | ICD-10-CM | POA: Diagnosis not present

## 2019-01-19 DIAGNOSIS — M9904 Segmental and somatic dysfunction of sacral region: Secondary | ICD-10-CM | POA: Diagnosis not present

## 2019-01-31 ENCOUNTER — Other Ambulatory Visit: Payer: Self-pay

## 2019-01-31 DIAGNOSIS — M5137 Other intervertebral disc degeneration, lumbosacral region: Secondary | ICD-10-CM | POA: Diagnosis not present

## 2019-01-31 DIAGNOSIS — M9903 Segmental and somatic dysfunction of lumbar region: Secondary | ICD-10-CM | POA: Diagnosis not present

## 2019-01-31 DIAGNOSIS — M9904 Segmental and somatic dysfunction of sacral region: Secondary | ICD-10-CM | POA: Diagnosis not present

## 2019-01-31 DIAGNOSIS — M5442 Lumbago with sciatica, left side: Secondary | ICD-10-CM | POA: Diagnosis not present

## 2019-01-31 DIAGNOSIS — M5136 Other intervertebral disc degeneration, lumbar region: Secondary | ICD-10-CM | POA: Diagnosis not present

## 2019-01-31 NOTE — Patient Outreach (Signed)
[facility]) Care Management  01/31/2019  Brennan Pribble 27-May-1950 [id number]   Medication Adherence call to Mrs. Ernestene Bertha HIPPA Compliant Voice message left with a call back number. Mrs. Frankie is showing past due on Rosuvastatin 5 mg and Metformin 500 mg under [place].   [place] Management Direct Dial [phone]  Fax [phone] [email or web address]

## 2019-02-02 DIAGNOSIS — M5442 Lumbago with sciatica, left side: Secondary | ICD-10-CM | POA: Diagnosis not present

## 2019-02-02 DIAGNOSIS — M9904 Segmental and somatic dysfunction of sacral region: Secondary | ICD-10-CM | POA: Diagnosis not present

## 2019-02-02 DIAGNOSIS — M5136 Other intervertebral disc degeneration, lumbar region: Secondary | ICD-10-CM | POA: Diagnosis not present

## 2019-02-02 DIAGNOSIS — M9903 Segmental and somatic dysfunction of lumbar region: Secondary | ICD-10-CM | POA: Diagnosis not present

## 2019-02-02 DIAGNOSIS — M5137 Other intervertebral disc degeneration, lumbosacral region: Secondary | ICD-10-CM | POA: Diagnosis not present

## 2019-02-06 DIAGNOSIS — M5137 Other intervertebral disc degeneration, lumbosacral region: Secondary | ICD-10-CM | POA: Diagnosis not present

## 2019-02-06 DIAGNOSIS — L400 Psoriasis vulgaris: Secondary | ICD-10-CM | POA: Diagnosis not present

## 2019-02-06 DIAGNOSIS — R531 Weakness: Secondary | ICD-10-CM | POA: Diagnosis not present

## 2019-02-06 DIAGNOSIS — L4050 Arthropathic psoriasis, unspecified: Secondary | ICD-10-CM | POA: Diagnosis not present

## 2019-02-06 DIAGNOSIS — M5442 Lumbago with sciatica, left side: Secondary | ICD-10-CM | POA: Diagnosis not present

## 2019-02-06 DIAGNOSIS — M5136 Other intervertebral disc degeneration, lumbar region: Secondary | ICD-10-CM | POA: Diagnosis not present

## 2019-02-06 DIAGNOSIS — M9903 Segmental and somatic dysfunction of lumbar region: Secondary | ICD-10-CM | POA: Diagnosis not present

## 2019-02-06 DIAGNOSIS — M9904 Segmental and somatic dysfunction of sacral region: Secondary | ICD-10-CM | POA: Diagnosis not present

## 2019-02-09 DIAGNOSIS — M9903 Segmental and somatic dysfunction of lumbar region: Secondary | ICD-10-CM | POA: Diagnosis not present

## 2019-02-09 DIAGNOSIS — M5136 Other intervertebral disc degeneration, lumbar region: Secondary | ICD-10-CM | POA: Diagnosis not present

## 2019-02-09 DIAGNOSIS — M5442 Lumbago with sciatica, left side: Secondary | ICD-10-CM | POA: Diagnosis not present

## 2019-02-09 DIAGNOSIS — M9904 Segmental and somatic dysfunction of sacral region: Secondary | ICD-10-CM | POA: Diagnosis not present

## 2019-02-09 DIAGNOSIS — M5137 Other intervertebral disc degeneration, lumbosacral region: Secondary | ICD-10-CM | POA: Diagnosis not present

## 2019-02-16 DIAGNOSIS — M5136 Other intervertebral disc degeneration, lumbar region: Secondary | ICD-10-CM | POA: Diagnosis not present

## 2019-02-16 DIAGNOSIS — M9903 Segmental and somatic dysfunction of lumbar region: Secondary | ICD-10-CM | POA: Diagnosis not present

## 2019-02-16 DIAGNOSIS — M5442 Lumbago with sciatica, left side: Secondary | ICD-10-CM | POA: Diagnosis not present

## 2019-02-16 DIAGNOSIS — M9904 Segmental and somatic dysfunction of sacral region: Secondary | ICD-10-CM | POA: Diagnosis not present

## 2019-02-16 DIAGNOSIS — M5137 Other intervertebral disc degeneration, lumbosacral region: Secondary | ICD-10-CM | POA: Diagnosis not present

## 2019-03-01 DIAGNOSIS — M9904 Segmental and somatic dysfunction of sacral region: Secondary | ICD-10-CM | POA: Diagnosis not present

## 2019-03-01 DIAGNOSIS — M5137 Other intervertebral disc degeneration, lumbosacral region: Secondary | ICD-10-CM | POA: Diagnosis not present

## 2019-03-01 DIAGNOSIS — M9903 Segmental and somatic dysfunction of lumbar region: Secondary | ICD-10-CM | POA: Diagnosis not present

## 2019-03-01 DIAGNOSIS — M5136 Other intervertebral disc degeneration, lumbar region: Secondary | ICD-10-CM | POA: Diagnosis not present

## 2019-03-01 DIAGNOSIS — M5442 Lumbago with sciatica, left side: Secondary | ICD-10-CM | POA: Diagnosis not present

## 2019-03-06 DIAGNOSIS — G4733 Obstructive sleep apnea (adult) (pediatric): Secondary | ICD-10-CM | POA: Diagnosis not present

## 2019-03-15 DIAGNOSIS — M5137 Other intervertebral disc degeneration, lumbosacral region: Secondary | ICD-10-CM | POA: Diagnosis not present

## 2019-03-15 DIAGNOSIS — M9903 Segmental and somatic dysfunction of lumbar region: Secondary | ICD-10-CM | POA: Diagnosis not present

## 2019-03-15 DIAGNOSIS — M9904 Segmental and somatic dysfunction of sacral region: Secondary | ICD-10-CM | POA: Diagnosis not present

## 2019-03-15 DIAGNOSIS — M5136 Other intervertebral disc degeneration, lumbar region: Secondary | ICD-10-CM | POA: Diagnosis not present

## 2019-03-15 DIAGNOSIS — M5442 Lumbago with sciatica, left side: Secondary | ICD-10-CM | POA: Diagnosis not present

## 2019-04-07 ENCOUNTER — Other Ambulatory Visit: Payer: Self-pay | Admitting: Family Medicine

## 2019-04-07 MED ORDER — VITAMIN D (ERGOCALCIFEROL) 1.25 MG (50000 UNIT) PO CAPS: 50000.0000 [IU] | ORAL_CAPSULE | ORAL | 3 refills | Status: DC

## 2019-04-21 ENCOUNTER — Ambulatory Visit: Payer: Self-pay | Admitting: Family Medicine

## 2019-04-21 ENCOUNTER — Other Ambulatory Visit: Payer: Self-pay | Admitting: Family Medicine

## 2019-04-24 ENCOUNTER — Encounter (INDEPENDENT_AMBULATORY_CARE_PROVIDER_SITE_OTHER): Payer: Self-pay

## 2019-04-25 ENCOUNTER — Other Ambulatory Visit: Payer: Self-pay

## 2019-04-25 MED ORDER — VITAMIN D (ERGOCALCIFEROL) 1.25 MG (50000 UNIT) PO CAPS: 50000.0000 [IU] | ORAL_CAPSULE | ORAL | 3 refills | Status: DC

## 2019-04-28 ENCOUNTER — Ambulatory Visit: Payer: Self-pay | Admitting: Family Medicine

## 2019-05-01 ENCOUNTER — Ambulatory Visit (INDEPENDENT_AMBULATORY_CARE_PROVIDER_SITE_OTHER): Payer: Self-pay | Admitting: Bariatrics

## 2019-05-01 ENCOUNTER — Other Ambulatory Visit: Payer: Self-pay | Admitting: Family Medicine

## 2019-05-11 DIAGNOSIS — Z1231 Encounter for screening mammogram for malignant neoplasm of breast: Secondary | ICD-10-CM | POA: Diagnosis not present

## 2019-05-11 LAB — HM MAMMOGRAPHY: HM Mammogram: NORMAL (ref 0–4)

## 2019-05-15 ENCOUNTER — Ambulatory Visit (INDEPENDENT_AMBULATORY_CARE_PROVIDER_SITE_OTHER): Payer: Self-pay | Admitting: Bariatrics

## 2019-05-19 ENCOUNTER — Ambulatory Visit: Payer: Self-pay | Admitting: Physician Assistant

## 2019-05-19 ENCOUNTER — Ambulatory Visit: Payer: Self-pay | Admitting: Family Medicine

## 2019-05-22 ENCOUNTER — Ambulatory Visit (INDEPENDENT_AMBULATORY_CARE_PROVIDER_SITE_OTHER): Payer: Medicare Other | Admitting: Physician Assistant

## 2019-05-22 ENCOUNTER — Encounter: Payer: Self-pay | Admitting: Physician Assistant

## 2019-05-22 ENCOUNTER — Other Ambulatory Visit: Payer: Self-pay

## 2019-05-22 VITALS — BP 128/70 | HR 64 | Temp 96.5°F | Resp 16 | Wt 315.0 lb

## 2019-05-22 DIAGNOSIS — E038 Other specified hypothyroidism: Secondary | ICD-10-CM

## 2019-05-22 DIAGNOSIS — E782 Mixed hyperlipidemia: Secondary | ICD-10-CM

## 2019-05-22 DIAGNOSIS — I10 Essential (primary) hypertension: Secondary | ICD-10-CM | POA: Diagnosis not present

## 2019-05-22 DIAGNOSIS — E559 Vitamin D deficiency, unspecified: Secondary | ICD-10-CM

## 2019-05-22 DIAGNOSIS — E119 Type 2 diabetes mellitus without complications: Secondary | ICD-10-CM | POA: Diagnosis not present

## 2019-05-23 ENCOUNTER — Encounter: Payer: Self-pay | Admitting: Physician Assistant

## 2019-05-23 DIAGNOSIS — E782 Mixed hyperlipidemia: Secondary | ICD-10-CM | POA: Insufficient documentation

## 2019-05-23 DIAGNOSIS — E559 Vitamin D deficiency, unspecified: Secondary | ICD-10-CM | POA: Insufficient documentation

## 2019-05-23 DIAGNOSIS — E038 Other specified hypothyroidism: Secondary | ICD-10-CM | POA: Insufficient documentation

## 2019-05-23 DIAGNOSIS — I10 Essential (primary) hypertension: Secondary | ICD-10-CM | POA: Insufficient documentation

## 2019-05-23 DIAGNOSIS — E1122 Type 2 diabetes mellitus with diabetic chronic kidney disease: Secondary | ICD-10-CM | POA: Insufficient documentation

## 2019-05-23 DIAGNOSIS — E1159 Type 2 diabetes mellitus with other circulatory complications: Secondary | ICD-10-CM | POA: Insufficient documentation

## 2019-05-23 DIAGNOSIS — E119 Type 2 diabetes mellitus without complications: Secondary | ICD-10-CM | POA: Insufficient documentation

## 2019-05-23 LAB — COMPREHENSIVE METABOLIC PANEL
ALT: 23 IU/L (ref 0–32)
AST: 21 IU/L (ref 0–40)
Albumin/Globulin Ratio: 1.4 (ref 1.2–2.2)
Albumin: 3.9 g/dL (ref 3.8–4.8)
Alkaline Phosphatase: 83 IU/L (ref 39–117)
BUN/Creatinine Ratio: 20 (ref 12–28)
BUN: 17 mg/dL (ref 8–27)
Bilirubin Total: 0.5 mg/dL (ref 0.0–1.2)
CO2: 29 mmol/L (ref 20–29)
Calcium: 9.3 mg/dL (ref 8.7–10.3)
Chloride: 99 mmol/L (ref 96–106)
Creatinine, Ser: 0.84 mg/dL (ref 0.57–1.00)
GFR calc Af Amer: 83 mL/min/{1.73_m2} (ref >59)
GFR calc non Af Amer: 72 mL/min/{1.73_m2} (ref >59)
Globulin, Total: 2.8 g/dL (ref 1.5–4.5)
Glucose: 126 mg/dL — ABNORMAL HIGH (ref 65–99)
Potassium: 3.7 mmol/L (ref 3.5–5.2)
Sodium: 139 mmol/L (ref 134–144)
Total Protein: 6.7 g/dL (ref 6.0–8.5)

## 2019-05-23 LAB — CBC WITH DIFFERENTIAL/PLATELET
Basophils Absolute: 0.1 10*3/uL (ref 0.0–0.2)
Basos: 1 %
EOS (ABSOLUTE): 0.2 10*3/uL (ref 0.0–0.4)
Eos: 3 %
Hematocrit: 37.3 % (ref 34.0–46.6)
Hemoglobin: 12.7 g/dL (ref 11.1–15.9)
Immature Grans (Abs): 0 10*3/uL (ref 0.0–0.1)
Immature Granulocytes: 1 %
Lymphocytes Absolute: 2.5 10*3/uL (ref 0.7–3.1)
Lymphs: 30 %
MCH: 30.2 pg (ref 26.6–33.0)
MCHC: 34 g/dL (ref 31.5–35.7)
MCV: 89 fL (ref 79–97)
Monocytes Absolute: 0.6 10*3/uL (ref 0.1–0.9)
Monocytes: 8 %
Neutrophils Absolute: 4.7 10*3/uL (ref 1.4–7.0)
Neutrophils: 57 %
Platelets: 206 10*3/uL (ref 150–450)
RBC: 4.21 x10E6/uL (ref 3.77–5.28)
RDW: 12.7 % (ref 11.7–15.4)
WBC: 8.1 10*3/uL (ref 3.4–10.8)

## 2019-05-23 LAB — VITAMIN D 25 HYDROXY (VIT D DEFICIENCY, FRACTURES): Vit D, 25-Hydroxy: 48.5 ng/mL (ref 30.0–100.0)

## 2019-05-23 LAB — LIPID PANEL
Chol/HDL Ratio: 5 ratio — ABNORMAL HIGH (ref 0.0–4.4)
Cholesterol, Total: 194 mg/dL (ref 100–199)
HDL: 39 mg/dL — ABNORMAL LOW (ref >39)
LDL Chol Calc (NIH): 121 mg/dL — ABNORMAL HIGH (ref 0–99)
Triglycerides: 192 mg/dL — ABNORMAL HIGH (ref 0–149)
VLDL Cholesterol Cal: 34 mg/dL (ref 5–40)

## 2019-05-23 LAB — CARDIOVASCULAR RISK ASSESSMENT

## 2019-05-23 LAB — TSH: TSH: 0.085 u[IU]/mL — ABNORMAL LOW (ref 0.450–4.500)

## 2019-05-23 LAB — HEMOGLOBIN A1C
Est. average glucose Bld gHb Est-mCnc: 157 mg/dL
Hgb A1c MFr Bld: 7.1 % — ABNORMAL HIGH (ref 4.8–5.6)

## 2019-05-23 NOTE — Assessment & Plan Note (Signed)
Well controlled.  ?No changes to medicines.  ?Continue to work on eating a healthy diet and exercise.  ?Labs drawn today.  ?

## 2019-05-23 NOTE — Assessment & Plan Note (Signed)
TSH pending Continue current meds as directed 

## 2019-05-23 NOTE — Progress Notes (Signed)
Established Patient Office Visit  Subjective:  Patient ID: Whitney Eaton, female    DOB: [birth year]  Age: 69 y.o. MRN: [id number]  CC:  Chief Complaint  Patient presents with  . Hyperlipidemia  . Hypertension  . Diabetes    HPI Whitney Eaton presents for follow up hypertension Pt presents for follow up of hypertension.  The patient is tolerating the medication well without side effects. Compliance with treatment has been good; including taking medication as directed , maintains a healthy diet and regular exercise regimen , and following up as directed. Patient was evaluated using exam, physical, labs and other information to perform evidence based decision making. She is currently taking valsartan/hctz 320/25 and atenolol 25mg  qd  Pt presents with hyperlipidemia. Compliance with treatment has been fair; The patient is noncompliant with medications, maintains a low cholesterol diet , follows up as directed ,The patient denies experiencing any hypercholesterolemia related symptoms.  Evidenced based information based on history , exam, and other sources has been used  for decision making.pt taking fish oil - has stopped crestor and does not want to restart  Hypothyroidism: Patient presents for evaluation of thyroid function.pt voices no current problems or concerns - is currently taking synthroid 133mcg qd - due for labwork  69 y.o. female for follow up of diabetes. Diabetic Review of Systems - medication compliance: compliant most of the time, diabetic diet compliance: compliant most of the time, home glucose monitoring: is performed regularly, acute symptoms are none.  Other symptoms and concerns: none - she is doing well -however pt is only taking her glucophage once daily instead of bid  Pt with history of Vit D deficiency - due for labwork    Current Outpatient Medications  Medication Sig Dispense Refill  . atenolol (TENORMIN) 25 MG tablet Take 25 mg by mouth daily.    . Ixekizumab  (TALTZ) 80 MG/ML SOAJ Inject into the skin.    Marland Kitchen levothyroxine (SYNTHROID) 175 MCG tablet Take 175 mcg by mouth daily.    . meloxicam (MOBIC) 15 MG tablet TAKE 1 TABLET BY MOUTH EVERY DAY 90 tablet 1  . metFORMIN (GLUCOPHAGE) 1000 MG tablet TAKE 1 TABLET(1000 MG) BY MOUTH TWICE DAILY WITH THE MORNING AND EVENING MEAL 180 tablet 1  . Multiple Vitamin (MULTI-VITAMIN) tablet Take by mouth.    . Omega-3 1000 MG CAPS Take by mouth.    . valsartan-hydrochlorothiazide (DIOVAN-HCT) 320-25 MG tablet Take 1 tablet by mouth daily.    . Vitamin D, Ergocalciferol, (DRISDOL) 1.25 MG (50000 UNIT) CAPS capsule Take 1 capsule (50,000 Units total) by mouth every 7 (seven) days. 13 capsule 3  . zolpidem (AMBIEN) 10 MG tablet Take 10 mg by mouth at bedtime.     No current facility-administered medications for this visit.      Past Medical History:  Diagnosis Date  . Diabetes ([place])   . Hyperlipemia   . Hypertension     Past Surgical History:  Procedure Laterality Date  . CHOLECYSTECTOMY    . HYSTERECTOMY ABDOMINAL WITH SALPINGECTOMY      Family History  Problem Relation Age of Onset  . Alzheimer's disease Mother   . Breast cancer Mother   . CAD Father   . Diabetes Father     Social History   Socioeconomic History  . Marital status: Married    Spouse name: Not on file  . Number of children: 2  . Years of education: Not on file  . Highest education level: Not on file  Occupational History  . Occupation: homemaker  Tobacco Use  . Smoking status: Never Smoker  . Smokeless tobacco: Never Used  Substance and Sexual Activity  . Alcohol use: Never  . Drug use: Never  . Sexual activity: Not on file  Other Topics Concern  . Not on file  Social History Narrative  . Not on file   Social Determinants of Health   Financial Resource Strain:   . Difficulty of Paying Living Expenses:   Food Insecurity:   . Worried About Charity fundraiser in the Last Year:   . Arboriculturist in the Last  Year:   Transportation Needs:   . Film/video editor (Medical):   Marland Kitchen Lack of Transportation (Non-Medical):   Physical Activity:   . Days of Exercise per Week:   . Minutes of Exercise per Session:   Stress:   . Feeling of Stress :   Social Connections:   . Frequency of Communication with Friends and Family:   . Frequency of Social Gatherings with Friends and Family:   . Attends Religious Services:   . Active Member of Clubs or Organizations:   . Attends Archivist Meetings:   Marland Kitchen Marital Status:   Intimate Partner Violence:   . Fear of Current or Ex-Partner:   . Emotionally Abused:   Marland Kitchen Physically Abused:   . Sexually Abused:      Current Outpatient Medications:  .  atenolol (TENORMIN) 25 MG tablet, Take 25 mg by mouth daily., Disp: , Rfl:  .  Ixekizumab (TALTZ) 80 MG/ML SOAJ, Inject into the skin., Disp: , Rfl:  .  levothyroxine (SYNTHROID) 175 MCG tablet, Take 175 mcg by mouth daily., Disp: , Rfl:  .  meloxicam (MOBIC) 15 MG tablet, TAKE 1 TABLET BY MOUTH EVERY DAY, Disp: 90 tablet, Rfl: 1 .  metFORMIN (GLUCOPHAGE) 1000 MG tablet, TAKE 1 TABLET(1000 MG) BY MOUTH TWICE DAILY WITH THE MORNING AND EVENING MEAL, Disp: 180 tablet, Rfl: 1 .  Multiple Vitamin (MULTI-VITAMIN) tablet, Take by mouth., Disp: , Rfl:  .  Omega-3 1000 MG CAPS, Take by mouth., Disp: , Rfl:  .  valsartan-hydrochlorothiazide (DIOVAN-HCT) 320-25 MG tablet, Take 1 tablet by mouth daily., Disp: , Rfl:  .  Vitamin D, Ergocalciferol, (DRISDOL) 1.25 MG (50000 UNIT) CAPS capsule, Take 1 capsule (50,000 Units total) by mouth every 7 (seven) days., Disp: 13 capsule, Rfl: 3 .  zolpidem (AMBIEN) 10 MG tablet, Take 10 mg by mouth at bedtime., Disp: , Rfl:    Not on File  ROS CONSTITUTIONAL: Negative for chills, fatigue, fever, unintentional weight gain and unintentional weight loss.  E/N/T: Negative for ear pain, nasal congestion and sore throat.  CARDIOVASCULAR: Negative for chest pain, dizziness,  palpitations and pedal edema.  RESPIRATORY: Negative for recent cough and dyspnea.  GASTROINTESTINAL: Negative for abdominal pain, acid reflux symptoms, constipation, diarrhea, nausea and vomiting.  MSK: Negative for arthralgias and myalgias.  INTEGUMENTARY: Negative for rash.  NEUROLOGICAL: Negative for dizziness and headaches.  PSYCHIATRIC: Negative for sleep disturbance and to question depression screen.  Negative for depression, negative for anhedonia.        Objective:    PHYSICAL EXAM:   VS: BP 128/70   Pulse 64   Temp (!) 96.5 F (35.8 C)   Resp 16   Wt (!) 315 lb (142.9 kg)   SpO2 (!) 64%   GEN: Well nourished, well developed, in no acute distress   Cardiac: RRR; no murmurs, rubs, or gallops,no  edema - no significant varicosities Respiratory:  normal respiratory rate and pattern with no distress - normal breath sounds with no rales, rhonchi, wheezes or rubs  MS: no deformity or atrophy  Skin: warm and dry, no rash  Neuro:  Alert and Oriented x 3, Strength and sensation are intact - CN II-Xii grossly intact Psych: euthymic mood, appropriate affect and demeanor    BP 128/70   Pulse 64   Temp (!) 96.5 F (35.8 C)   Resp 16   Wt (!) 315 lb (142.9 kg)   SpO2 (!) 64%  Wt Readings from Last 3 Encounters:  05/22/19 (!) 315 lb (142.9 kg)    Diabetic Foot Exam - Simple   Simple Foot Form Diabetic Foot exam was performed with the following findings: Yes 05/22/2019 10:11 AM  Visual Inspection No deformities, no ulcerations, no other skin breakdown bilaterally: Yes Sensation Testing Intact to touch and monofilament testing bilaterally: Yes Pulse Check Posterior Tibialis and Dorsalis pulse intact bilaterally: Yes Comments     Health Maintenance Due  Topic Date Due  . Hepatitis C Screening  Never done  . OPHTHALMOLOGY EXAM  Never done  . TETANUS/TDAP  Never done  . COLONOSCOPY  Never done  . DEXA SCAN  Never done  . PNA vac Low Risk Adult (1 of 2 - PCV13)  Never done    There are no preventive care reminders to display for this patient.  Lab Results  Component Value Date   TSH 0.085 (L) 05/22/2019   Lab Results  Component Value Date   WBC 8.1 05/22/2019   HGB 12.7 05/22/2019   HCT 37.3 05/22/2019   MCV 89 05/22/2019   PLT 206 05/22/2019   Lab Results  Component Value Date   NA 139 05/22/2019   K 3.7 05/22/2019   CO2 29 05/22/2019   GLUCOSE 126 (H) 05/22/2019   BUN 17 05/22/2019   CREATININE 0.84 05/22/2019   BILITOT 0.5 05/22/2019   ALKPHOS 83 05/22/2019   AST 21 05/22/2019   ALT 23 05/22/2019   PROT 6.7 05/22/2019   ALBUMIN 3.9 05/22/2019   CALCIUM 9.3 05/22/2019   Lab Results  Component Value Date   CHOL 194 05/22/2019   Lab Results  Component Value Date   HDL 39 (L) 05/22/2019   Lab Results  Component Value Date   LDLCALC 121 (H) 05/22/2019   Lab Results  Component Value Date   TRIG 192 (H) 05/22/2019   Lab Results  Component Value Date   CHOLHDL 5.0 (H) 05/22/2019   Lab Results  Component Value Date   HGBA1C 7.1 (H) 05/22/2019      Assessment & Plan:   Problem List Items Addressed This Visit      Cardiovascular and Mediastinum   Benign hypertension    Well controlled.  No changes to medicines.  Continue to work on eating a healthy diet and exercise.  Labs drawn today.        Relevant Medications   valsartan-hydrochlorothiazide (DIOVAN-HCT) 320-25 MG tablet   atenolol (TENORMIN) 25 MG tablet   Other Relevant Orders   CBC with Differential/Platelet (Completed)   Comprehensive metabolic panel (Completed)     Endocrine   Other specified hypothyroidism    TSH pending Continue current meds as directed      Relevant Medications   levothyroxine (SYNTHROID) 175 MCG tablet   atenolol (TENORMIN) 25 MG tablet   Other Relevant Orders   TSH (Completed)   Diabetes mellitus type II, non insulin  dependent ([place])    Well controlled.  No changes to medicines.  Continue to work on eating a  healthy diet and exercise.  Labs drawn today.        Relevant Medications   valsartan-hydrochlorothiazide (DIOVAN-HCT) 320-25 MG tablet   Other Relevant Orders   Hemoglobin A1c (Completed)     Other   Vitamin D insufficiency    Continue meds labwork pending      Relevant Orders   VITAMIN D 25 Hydroxy (Vit-D Deficiency, Fractures) (Completed)   Mixed hyperlipidemia - Primary    Well controlled.  No changes to medicines.  Continue to work on eating a healthy diet and exercise.  Labs drawn today.        Relevant Medications   valsartan-hydrochlorothiazide (DIOVAN-HCT) 320-25 MG tablet   atenolol (TENORMIN) 25 MG tablet   Other Relevant Orders   Lipid panel (Completed)      No orders of the defined types were placed in this encounter.   Follow-up: Return in about 3 months (around 08/22/2019) for chronic follow up with Dr Tobie Poet.    SARA R Shelley Cocke, PA-C

## 2019-05-23 NOTE — Assessment & Plan Note (Signed)
Continue meds  ?labwork pending ?

## 2019-05-25 ENCOUNTER — Other Ambulatory Visit: Payer: Self-pay | Admitting: Physician Assistant

## 2019-05-25 DIAGNOSIS — E038 Other specified hypothyroidism: Secondary | ICD-10-CM

## 2019-05-25 MED ORDER — LEVOTHYROXINE SODIUM 150 MCG PO TABS: 150.0000 ug | ORAL_TABLET | Freq: Every day | ORAL | 0 refills | Status: DC

## 2019-05-31 ENCOUNTER — Other Ambulatory Visit: Payer: Self-pay | Admitting: Family Medicine

## 2019-05-31 DIAGNOSIS — I10 Essential (primary) hypertension: Secondary | ICD-10-CM

## 2019-06-06 DIAGNOSIS — G4733 Obstructive sleep apnea (adult) (pediatric): Secondary | ICD-10-CM | POA: Diagnosis not present

## 2019-07-20 ENCOUNTER — Ambulatory Visit: Payer: Medicare Other

## 2019-07-20 ENCOUNTER — Other Ambulatory Visit: Payer: Self-pay

## 2019-07-20 DIAGNOSIS — E038 Other specified hypothyroidism: Secondary | ICD-10-CM | POA: Diagnosis not present

## 2019-07-21 ENCOUNTER — Other Ambulatory Visit: Payer: Self-pay | Admitting: Physician Assistant

## 2019-07-21 LAB — TSH: TSH: 0.592 u[IU]/mL (ref 0.450–4.500)

## 2019-07-21 MED ORDER — LEVOTHYROXINE SODIUM 150 MCG PO TABS: 150.0000 ug | ORAL_TABLET | Freq: Every day | ORAL | 0 refills | Status: DC

## 2019-08-23 ENCOUNTER — Ambulatory Visit: Payer: Medicare Other | Admitting: Family Medicine

## 2019-08-23 DIAGNOSIS — H40019 Open angle with borderline findings, low risk, unspecified eye: Secondary | ICD-10-CM | POA: Diagnosis not present

## 2019-08-23 DIAGNOSIS — H35372 Puckering of macula, left eye: Secondary | ICD-10-CM | POA: Diagnosis not present

## 2019-08-23 DIAGNOSIS — E119 Type 2 diabetes mellitus without complications: Secondary | ICD-10-CM | POA: Diagnosis not present

## 2019-08-23 DIAGNOSIS — H2510 Age-related nuclear cataract, unspecified eye: Secondary | ICD-10-CM | POA: Diagnosis not present

## 2019-08-23 LAB — HM DIABETES EYE EXAM

## 2019-08-26 ENCOUNTER — Other Ambulatory Visit: Payer: Self-pay | Admitting: Family Medicine

## 2019-08-26 DIAGNOSIS — E038 Other specified hypothyroidism: Secondary | ICD-10-CM

## 2019-08-29 ENCOUNTER — Other Ambulatory Visit: Payer: Self-pay | Admitting: Physician Assistant

## 2019-08-29 DIAGNOSIS — I10 Essential (primary) hypertension: Secondary | ICD-10-CM

## 2019-09-15 DIAGNOSIS — G4733 Obstructive sleep apnea (adult) (pediatric): Secondary | ICD-10-CM | POA: Diagnosis not present

## 2019-09-22 NOTE — Progress Notes (Signed)
Established Patient Office Visit  Subjective:  Patient ID: Whitney Eaton, female    DOB: Nov 16, 1950  Age: 69 y.o. MRN: [id number]  CC:  Chief Complaint  Patient presents with  . Hyperlipidemia  . Hypertension  . Diabetes    HPI Whitney Eaton presents for follow up hypertension Pt presents for follow up of hypertension.  The patient is tolerating the medication well without side effects. Compliance with treatment has been good; including taking medication as directed , maintains a healthy diet and regular exercise regimen , and following up as directed. Patient was evaluated using exam, physical, labs and other information to perform evidence based decision making. She is currently taking valsartan/hctz 320/25 and atenolol 5m qd  Pt presents with hyperlipidemia. Compliance with treatment has been fair; The patient is noncompliant with medications, maintains a low cholesterol diet , follows up as directed ,The patient denies experiencing any hypercholesterolemia related symptoms.  Evidenced based information based on history , exam, and other sources has been used  for decision making.pt taking fish oil - has stopped crestor and does not want to restart.  The patient presents with history of type 2 diabetes mellitus without complications. Patient was diagnosed years ago. Compliance with treatment has not been good; the patient does not take medication as directed, maintains a diabetic diet and an exercise regimen , follows up as directed, and is keeping a glucose diary. Patient does not take her FBS at home. Patient specifically denies associated symptoms, including blurred vision, fatigue, polydipsia, polyphagia and polyuria . Patient denies hypoglycemia. In regard to preventative care, the patient performs foot self-exams daily and last ophthalmology exam was in 08/23/2019.  Hypothyroidism: Patient presents for evaluation of thyroid function.pt voices no current problems or concerns - is  currently taking synthroid 159m daily.    Current Outpatient Medications  Medication Sig Dispense Refill  . atenolol (TENORMIN) 25 MG tablet Take 25 mg by mouth daily.    . blood glucose meter kit and supplies KIT Check sugars fasting. E11.69 1 each 0  . Ixekizumab (TALTZ) 80 MG/ML SOAJ Inject into the skin. Every 2 weeks. Done by DePayton Mccallum   . levothyroxine (SYNTHROID) 150 MCG tablet Take 1 tablet (150 mcg total) by mouth daily. 90 tablet 0  . meloxicam (MOBIC) 15 MG tablet TAKE 1 TABLET BY MOUTH EVERY DAY 90 tablet 1  . Multiple Vitamin (MULTI-VITAMIN) tablet Take by mouth.    . Omega-3 1000 MG CAPS Take by mouth.    . valsartan-hydrochlorothiazide (DIOVAN-HCT) 320-25 MG tablet TAKE 1 TABLET BY MOUTH DAILY 90 tablet 0  . Vitamin D, Ergocalciferol, (DRISDOL) 1.25 MG (50000 UNIT) CAPS capsule Take 1 capsule (50,000 Units total) by mouth every 7 (seven) days. 13 capsule 3  . zolpidem (AMBIEN) 10 MG tablet TAKE 1 TABLET BY MOUTH EVERY DAY AT BEDTIME 30 tablet 3   No current facility-administered medications for this visit.      Past Medical History:  Diagnosis Date  . Diabetes ([place]  . Hyperlipemia   . Hypertension     Past Surgical History:  Procedure Laterality Date  . CHOLECYSTECTOMY    . HYSTERECTOMY ABDOMINAL WITH SALPINGECTOMY      Family History  Problem Relation Age of Onset  . Alzheimer's disease Mother   . Breast cancer Mother   . CAD Father   . Diabetes Father     Social History   Socioeconomic History  . Marital status: Married    Spouse name: Not on file  .  Number of children: 2  . Years of education: Not on file  . Highest education level: Not on file  Occupational History  . Occupation: homemaker  Tobacco Use  . Smoking status: Never Smoker  . Smokeless tobacco: Never Used  Vaping Use  . Vaping Use: Never used  Substance and Sexual Activity  . Alcohol use: Never  . Drug use: Never  . Sexual activity: Not on file  Other Topics Concern  . Not  on file  Social History Narrative  . Not on file   Social Determinants of Health   Financial Resource Strain:   . Difficulty of Paying Living Expenses:   Food Insecurity:   . Worried About Charity fundraiser in the Last Year:   . Arboriculturist in the Last Year:   Transportation Needs:   . Film/video editor (Medical):   Marland Kitchen Lack of Transportation (Non-Medical):   Physical Activity:   . Days of Exercise per Week:   . Minutes of Exercise per Session:   Stress:   . Feeling of Stress :   Social Connections:   . Frequency of Communication with Friends and Family:   . Frequency of Social Gatherings with Friends and Family:   . Attends Religious Services:   . Active Member of Clubs or Organizations:   . Attends Archivist Meetings:   Marland Kitchen Marital Status:   Intimate Partner Violence:   . Fear of Current or Ex-Partner:   . Emotionally Abused:   Marland Kitchen Physically Abused:   . Sexually Abused:      Current Outpatient Medications:  .  atenolol (TENORMIN) 25 MG tablet, Take 25 mg by mouth daily., Disp: , Rfl:  .  blood glucose meter kit and supplies KIT, Check sugars fasting. E11.69, Disp: 1 each, Rfl: 0 .  Ixekizumab (TALTZ) 80 MG/ML SOAJ, Inject into the skin. Every 2 weeks. Done by Payton Mccallum., Disp: , Rfl:  .  levothyroxine (SYNTHROID) 150 MCG tablet, Take 1 tablet (150 mcg total) by mouth daily., Disp: 90 tablet, Rfl: 0 .  meloxicam (MOBIC) 15 MG tablet, TAKE 1 TABLET BY MOUTH EVERY DAY, Disp: 90 tablet, Rfl: 1 .  Multiple Vitamin (MULTI-VITAMIN) tablet, Take by mouth., Disp: , Rfl:  .  Omega-3 1000 MG CAPS, Take by mouth., Disp: , Rfl:  .  valsartan-hydrochlorothiazide (DIOVAN-HCT) 320-25 MG tablet, TAKE 1 TABLET BY MOUTH DAILY, Disp: 90 tablet, Rfl: 0 .  Vitamin D, Ergocalciferol, (DRISDOL) 1.25 MG (50000 UNIT) CAPS capsule, Take 1 capsule (50,000 Units total) by mouth every 7 (seven) days., Disp: 13 capsule, Rfl: 3 .  zolpidem (AMBIEN) 10 MG tablet, TAKE 1 TABLET BY MOUTH EVERY  DAY AT BEDTIME, Disp: 30 tablet, Rfl: 3   No Known Allergies  ROS   Review of Systems  Constitutional: Negative for chills, fever and malaise/fatigue.  HENT: Negative for ear pain, sinus pain and sore throat.   Respiratory: Negative for cough and shortness of breath.   Cardiovascular: Negative for chest pain.  Gastrointestinal: Negative for abdominal pain, constipation, diarrhea, nausea and vomiting.  Genitourinary: Negative for dysuria, frequency and urgency.  Musculoskeletal: Positive for back pain (Sees a Chiropractor). Negative for myalgias.  Neurological: Negative for tingling and headaches.  Endo/Heme/Allergies: Negative for polydipsia.  Psychiatric/Behavioral: Negative for depression.   Objective:  Physical Exam Vitals reviewed.  Constitutional:      Appearance: Normal appearance. She is normal weight.  Cardiovascular:     Rate and Rhythm: Normal rate and regular rhythm.  Pulses: Normal pulses.     Heart sounds: Normal heart sounds.  Pulmonary:     Effort: Pulmonary effort is normal. No respiratory distress.     Breath sounds: Normal breath sounds.  Abdominal:     General: Abdomen is flat. Bowel sounds are normal.     Palpations: Abdomen is soft.     Tenderness: There is no abdominal tenderness.  Neurological:     Mental Status: She is alert and oriented to person, place, and time.  Psychiatric:        Mood and Affect: Mood normal.        Behavior: Behavior normal.     Health Maintenance Due  Topic Date Due  . Hepatitis C Screening  Never done  . TETANUS/TDAP  Never done  . DEXA SCAN  Never done  . PNA vac Low Risk Adult (1 of 2 - PCV13) Never done    There are no preventive care reminders to display for this patient.  Lab Results  Component Value Date   TSH 0.592 07/20/2019   Lab Results  Component Value Date   WBC 7.6 09/25/2019   HGB 13.7 09/25/2019   HCT 40.7 09/25/2019   MCV 93 09/25/2019   PLT 239 09/25/2019   Lab Results  Component  Value Date   NA 139 09/25/2019   K 3.8 09/25/2019   CO2 27 09/25/2019   GLUCOSE 133 (H) 09/25/2019   BUN 15 09/25/2019   CREATININE 0.74 09/25/2019   BILITOT 0.6 09/25/2019   ALKPHOS 86 09/25/2019   AST 31 09/25/2019   ALT 27 09/25/2019   PROT 7.1 09/25/2019   ALBUMIN 4.0 09/25/2019   CALCIUM 9.4 09/25/2019   Lab Results  Component Value Date   CHOL 198 09/25/2019   Lab Results  Component Value Date   HDL 43 09/25/2019   Lab Results  Component Value Date   LDLCALC 116 (H) 09/25/2019   Lab Results  Component Value Date   TRIG 224 (H) 09/25/2019   Lab Results  Component Value Date   CHOLHDL 4.6 (H) 09/25/2019   Lab Results  Component Value Date   HGBA1C 7.3 (H) 09/25/2019      Assessment & Plan:  1. Mixed hyperlipidemia Well controlled.  No changes to medicines.  Continue to work on eating a healthy diet and exercise.  Labs drawn today.  - Lipid panel  2. Diabetes mellitus type II, non insulin dependent ([place]) Control:Good. Consider check sugars fasting daily depending on A1C.Marland Kitchen Recommend check feet daily. Recommend annual eye exams. Medicines:  Added Jardiance 10 mg 1 tablet daily (provided samples) will wait on lab results to decide rather or not to continue long term. Changed Metformin 1000 mg 1 tablet daily Continue to work on eating a healthy diet and exercise.  Refer to diabetic dietician Gaynelle Adu) Labs drawn today.   - Hemoglobin A1c - POCT UA - Microalbumin: 10  3. Benign hypertension Well controlled.  No changes to medicines.  Continue to work on eating a healthy diet and exercise.  Labs drawn today.  - CBC with Differential/Platelet - Comprehensive metabolic panel  4. Other specified hypothyroidism The current medical regimen is effective;  continue present plan and medications.  5. Vitamin D insufficiency The current medical regimen is effective;  continue present plan and medications.   Follow-up: Return in about 3 months (around  12/26/2019) for fasting.    Rochel Brome, MD

## 2019-09-25 ENCOUNTER — Ambulatory Visit (INDEPENDENT_AMBULATORY_CARE_PROVIDER_SITE_OTHER): Payer: Medicare Other | Admitting: Family Medicine

## 2019-09-25 ENCOUNTER — Other Ambulatory Visit: Payer: Self-pay

## 2019-09-25 ENCOUNTER — Encounter: Payer: Self-pay | Admitting: Family Medicine

## 2019-09-25 VITALS — BP 138/80 | HR 62 | Temp 96.5°F | Ht 68.0 in | Wt 313.0 lb

## 2019-09-25 DIAGNOSIS — E038 Other specified hypothyroidism: Secondary | ICD-10-CM | POA: Diagnosis not present

## 2019-09-25 DIAGNOSIS — I10 Essential (primary) hypertension: Secondary | ICD-10-CM

## 2019-09-25 DIAGNOSIS — E782 Mixed hyperlipidemia: Secondary | ICD-10-CM | POA: Diagnosis not present

## 2019-09-25 DIAGNOSIS — E119 Type 2 diabetes mellitus without complications: Secondary | ICD-10-CM | POA: Diagnosis not present

## 2019-09-25 DIAGNOSIS — E559 Vitamin D deficiency, unspecified: Secondary | ICD-10-CM

## 2019-09-25 LAB — POCT UA - MICROALBUMIN: Microalbumin Ur, POC: 10 mg/L

## 2019-09-25 NOTE — Patient Instructions (Addendum)
Should wear tennis shoes and socks vs sandals. Provided 1 month sample of Jardiance 10 mg-take 1 tablet in the morning Changed Metformin 1000 mg to 1 tablet in the morning Lab Work today- Will call you with the results Kim,LPN will contact you to schedule appt with our Dietician.    Diabetes Basics  Diabetes (diabetes mellitus) is a long-term (chronic) disease. It occurs when the body does not properly use sugar (glucose) that is released from food after you eat. Diabetes may be caused by one or both of these problems:  Your pancreas does not make enough of a hormone called insulin.  Your body does not react in a normal way to insulin that it makes. Insulin lets sugars (glucose) go into cells in your body. This gives you energy. If you have diabetes, sugars cannot get into cells. This causes high blood sugar (hyperglycemia). Follow these instructions at home: How is diabetes treated? You may need to take insulin or other diabetes medicines daily to keep your blood sugar in balance. Take your diabetes medicines every day as told by your doctor. List your diabetes medicines here: Diabetes medicines  Name of medicine: ______________________________ ? Amount (dose): _______________ Time (a.m./p.m.): _______________ Notes: ___________________________________  Name of medicine: ______________________________ ? Amount (dose): _______________ Time (a.m./p.m.): _______________ Notes: ___________________________________  Name of medicine: ______________________________ ? Amount (dose): _______________ Time (a.m./p.m.): _______________ Notes: ___________________________________ If you use insulin, you will learn how to give yourself insulin by injection. You may need to adjust the amount based on the food that you eat. List the types of insulin you use here: Insulin  Insulin type: ______________________________ ? Amount (dose): _______________ Time (a.m./p.m.): _______________ Notes:  ___________________________________  Insulin type: ______________________________ ? Amount (dose): _______________ Time (a.m./p.m.): _______________ Notes: ___________________________________  Insulin type: ______________________________ ? Amount (dose): _______________ Time (a.m./p.m.): _______________ Notes: ___________________________________  Insulin type: ______________________________ ? Amount (dose): _______________ Time (a.m./p.m.): _______________ Notes: ___________________________________  Insulin type: ______________________________ ? Amount (dose): _______________ Time (a.m./p.m.): _______________ Notes: ___________________________________ How do I manage my blood sugar?  Check your blood sugar levels using a blood glucose monitor as directed by your doctor. Your doctor will set treatment goals for you. Generally, you should have these blood sugar levels:  Before meals (preprandial): 80-130 mg/dL (4.4-7.2 mmol/L).  After meals (postprandial): below 180 mg/dL (10 mmol/L).  A1c level: less than 7%. Write down the times that you will check your blood sugar levels: Blood sugar checks  Time: _______________ Notes: ___________________________________  Time: _______________ Notes: ___________________________________  Time: _______________ Notes: ___________________________________  Time: _______________ Notes: ___________________________________  Time: _______________ Notes: ___________________________________  Time: _______________ Notes: ___________________________________  What do I need to know about low blood sugar? Low blood sugar is called hypoglycemia. This is when blood sugar is at or below 70 mg/dL (3.9 mmol/L). Symptoms may include:  Feeling: ? Hungry. ? Worried or nervous (anxious). ? Sweaty and clammy. ? Confused. ? Dizzy. ? Sleepy. ? Sick to your stomach (nauseous).  Having: ? A fast heartbeat. ? A headache. ? A change in your  vision. ? Tingling or no feeling (numbness) around the mouth, lips, or tongue. ? Jerky movements that you cannot control (seizure).  Having trouble with: ? Moving (coordination). ? Sleeping. ? Passing out (fainting). ? Getting upset easily (irritability). Treating low blood sugar To treat low blood sugar, eat or drink something sugary right away. If you can think clearly and swallow safely, follow the 15:15 rule:  Take 15 grams of a fast-acting carb (carbohydrate). Talk with your doctor  about how much you should take.  Some fast-acting carbs are: ? Sugar tablets (glucose pills). Take 3-4 glucose pills. ? 6-8 pieces of hard candy. ? 4-6 oz (120-150 mL) of fruit juice. ? 4-6 oz (120-150 mL) of regular (not diet) soda. ? 1 Tbsp (15 mL) honey or sugar.  Check your blood sugar 15 minutes after you take the carb.  If your blood sugar is still at or below 70 mg/dL (3.9 mmol/L), take 15 grams of a carb again.  If your blood sugar does not go above 70 mg/dL (3.9 mmol/L) after 3 tries, get help right away.  After your blood sugar goes back to normal, eat a meal or a snack within 1 hour. Treating very low blood sugar If your blood sugar is at or below 54 mg/dL (3 mmol/L), you have very low blood sugar (severe hypoglycemia). This is an emergency. Do not wait to see if the symptoms will go away. Get medical help right away. Call your local emergency services (911 in the [place] Do not drive yourself to the hospital. Questions to ask your health care provider  Do I need to meet with a diabetes educator?  What equipment will I need to care for myself at home?  What diabetes medicines do I need? When should I take them?  How often do I need to check my blood sugar?  What number can I call if I have questions?  When is my next doctor's visit?  Where can I find a support group for people with diabetes? Where to find more information  American Diabetes Association:  www.diabetes.org  American Association of Diabetes Educators: www.diabeteseducator.org/patient-resources Contact a doctor if:  Your blood sugar is at or above 240 mg/dL (13.3 mmol/L) for 2 days in a row.  You have been sick or have had a fever for 2 days or more, and you are not getting better.  You have any of these problems for more than 6 hours: ? You cannot eat or drink. ? You feel sick to your stomach (nauseous). ? You throw up (vomit). ? You have watery poop (diarrhea). Get help right away if:  Your blood sugar is lower than 54 mg/dL (3 mmol/L).  You get confused.  You have trouble: ? Thinking clearly. ? Breathing. Summary  Diabetes (diabetes mellitus) is a long-term (chronic) disease. It occurs when the body does not properly use sugar (glucose) that is released from food after digestion.  Take insulin and diabetes medicines as told.  Check your blood sugar every day, as often as told.  Keep all follow-up visits as told by your doctor. This is important. This information is not intended to replace advice given to you by your health care provider. Make sure you discuss any questions you have with your health care provider. Document Revised: 11/09/2018 Document Reviewed: 05/21/2017 Elsevier Patient Education  [employer].

## 2019-09-26 ENCOUNTER — Other Ambulatory Visit: Payer: Self-pay | Admitting: Family Medicine

## 2019-09-26 ENCOUNTER — Telehealth: Payer: Self-pay

## 2019-09-26 LAB — COMPREHENSIVE METABOLIC PANEL
ALT: 27 IU/L (ref 0–32)
AST: 31 IU/L (ref 0–40)
Albumin/Globulin Ratio: 1.3 (ref 1.2–2.2)
Albumin: 4 g/dL (ref 3.8–4.8)
Alkaline Phosphatase: 86 IU/L (ref 48–121)
BUN/Creatinine Ratio: 20 (ref 12–28)
BUN: 15 mg/dL (ref 8–27)
Bilirubin Total: 0.6 mg/dL (ref 0.0–1.2)
CO2: 27 mmol/L (ref 20–29)
Calcium: 9.4 mg/dL (ref 8.7–10.3)
Chloride: 98 mmol/L (ref 96–106)
Creatinine, Ser: 0.74 mg/dL (ref 0.57–1.00)
GFR calc Af Amer: 96 mL/min/{1.73_m2} (ref >59)
GFR calc non Af Amer: 84 mL/min/{1.73_m2} (ref >59)
Globulin, Total: 3.1 g/dL (ref 1.5–4.5)
Glucose: 133 mg/dL — ABNORMAL HIGH (ref 65–99)
Potassium: 3.8 mmol/L (ref 3.5–5.2)
Sodium: 139 mmol/L (ref 134–144)
Total Protein: 7.1 g/dL (ref 6.0–8.5)

## 2019-09-26 LAB — CBC WITH DIFFERENTIAL/PLATELET
Basophils Absolute: 0.1 10*3/uL (ref 0.0–0.2)
Basos: 1 %
EOS (ABSOLUTE): 0.2 10*3/uL (ref 0.0–0.4)
Eos: 2 %
Hematocrit: 40.7 % (ref 34.0–46.6)
Hemoglobin: 13.7 g/dL (ref 11.1–15.9)
Immature Grans (Abs): 0 10*3/uL (ref 0.0–0.1)
Immature Granulocytes: 0 %
Lymphocytes Absolute: 2.5 10*3/uL (ref 0.7–3.1)
Lymphs: 34 %
MCH: 31.1 pg (ref 26.6–33.0)
MCHC: 33.7 g/dL (ref 31.5–35.7)
MCV: 93 fL (ref 79–97)
Monocytes Absolute: 0.6 10*3/uL (ref 0.1–0.9)
Monocytes: 8 %
Neutrophils Absolute: 4.1 10*3/uL (ref 1.4–7.0)
Neutrophils: 55 %
Platelets: 239 10*3/uL (ref 150–450)
RBC: 4.4 x10E6/uL (ref 3.77–5.28)
RDW: 12.9 % (ref 11.7–15.4)
WBC: 7.6 10*3/uL (ref 3.4–10.8)

## 2019-09-26 LAB — LIPID PANEL
Chol/HDL Ratio: 4.6 ratio — ABNORMAL HIGH (ref 0.0–4.4)
Cholesterol, Total: 198 mg/dL (ref 100–199)
HDL: 43 mg/dL (ref >39)
LDL Chol Calc (NIH): 116 mg/dL — ABNORMAL HIGH (ref 0–99)
Triglycerides: 224 mg/dL — ABNORMAL HIGH (ref 0–149)
VLDL Cholesterol Cal: 39 mg/dL (ref 5–40)

## 2019-09-26 LAB — HEMOGLOBIN A1C
Est. average glucose Bld gHb Est-mCnc: 163 mg/dL
Hgb A1c MFr Bld: 7.3 % — ABNORMAL HIGH (ref 4.8–5.6)

## 2019-09-26 LAB — CARDIOVASCULAR RISK ASSESSMENT

## 2019-09-26 MED ORDER — BLOOD GLUCOSE MONITOR KIT: PACK | 0 refills | Status: AC

## 2019-09-26 NOTE — Telephone Encounter (Signed)
PA for synjardy [id number] submitted via covermymeds. No PA is required. Medication is covered.

## 2019-09-28 ENCOUNTER — Other Ambulatory Visit: Payer: Self-pay | Admitting: Family Medicine

## 2019-10-05 ENCOUNTER — Ambulatory Visit: Payer: Medicare Other

## 2019-10-24 ENCOUNTER — Telehealth: Payer: Self-pay

## 2019-10-24 NOTE — Telephone Encounter (Signed)
I am not familiar with having blood in stool as a side effect of jardiance.  I would stop it and restart in 2 weeks and try again. It may be a hemorrhoid.  Kc

## 2019-10-24 NOTE — Telephone Encounter (Addendum)
Patient took jardiance and she started to see blood on her bowel movements. She stopped the medication and the issue. So she will continue with metformin twice a day. Can you tell me your advice?

## 2019-10-28 ENCOUNTER — Other Ambulatory Visit: Payer: Self-pay | Admitting: Family Medicine

## 2019-10-29 ENCOUNTER — Other Ambulatory Visit: Payer: Self-pay | Admitting: Family Medicine

## 2019-10-30 ENCOUNTER — Other Ambulatory Visit: Payer: Self-pay

## 2019-10-30 MED ORDER — SYNJARDY XR 10-1000 MG PO TB24
1.0000 | ORAL_TABLET | Freq: Every day | ORAL | 1 refills | Status: DC
Start: 2019-10-30 — End: 2019-11-21

## 2019-10-30 MED ORDER — MELOXICAM 15 MG PO TABS: 15.0000 mg | ORAL_TABLET | Freq: Every day | ORAL | 1 refills | Status: DC

## 2019-10-30 MED ORDER — ROSUVASTATIN CALCIUM 5 MG PO TABS
5.0000 mg | ORAL_TABLET | Freq: Every day | ORAL | 1 refills | Status: DC
Start: 2019-10-30 — End: 2020-01-17

## 2019-11-14 ENCOUNTER — Other Ambulatory Visit: Payer: Self-pay | Admitting: Family Medicine

## 2019-11-14 ENCOUNTER — Telehealth: Payer: Self-pay

## 2019-11-14 NOTE — Telephone Encounter (Signed)
Whitney Eaton, Can you do anything for Whitney Eaton? Dr. Tobie Poet

## 2019-11-14 NOTE — Telephone Encounter (Signed)
Adair said that she was unable to afford the synjardy.  The insurance cost was $1800 and her copay was $100.  She has started back on the metformin 1000 bid and her sugars have been good.  She is faxing a blood sugar log to Dr. Tobie Poet for review.

## 2019-11-15 NOTE — Telephone Encounter (Signed)
Left a message with patient to discuss options.

## 2019-11-21 ENCOUNTER — Other Ambulatory Visit: Payer: Self-pay

## 2019-11-24 ENCOUNTER — Other Ambulatory Visit: Payer: Self-pay

## 2019-11-24 DIAGNOSIS — I10 Essential (primary) hypertension: Secondary | ICD-10-CM

## 2019-11-24 MED ORDER — ATENOLOL 25 MG PO TABS: 25.0000 mg | ORAL_TABLET | Freq: Every day | ORAL | 2 refills | Status: DC

## 2019-11-24 NOTE — Telephone Encounter (Signed)
Patient called to report that she has lost her atenolol RX.  She thinks she threw the bottle away by mistake.  She request that you resend the RX to the pharmacy.

## 2019-11-27 ENCOUNTER — Other Ambulatory Visit: Payer: Self-pay | Admitting: Physician Assistant

## 2019-11-27 DIAGNOSIS — I10 Essential (primary) hypertension: Secondary | ICD-10-CM

## 2019-12-20 ENCOUNTER — Other Ambulatory Visit: Payer: Self-pay | Admitting: Family Medicine

## 2019-12-22 DIAGNOSIS — G4733 Obstructive sleep apnea (adult) (pediatric): Secondary | ICD-10-CM | POA: Diagnosis not present

## 2019-12-23 ENCOUNTER — Other Ambulatory Visit: Payer: Self-pay | Admitting: Family Medicine

## 2019-12-23 DIAGNOSIS — E038 Other specified hypothyroidism: Secondary | ICD-10-CM

## 2019-12-27 ENCOUNTER — Ambulatory Visit: Payer: Medicare Other | Admitting: Family Medicine

## 2019-12-27 ENCOUNTER — Other Ambulatory Visit: Payer: Self-pay | Admitting: Family Medicine

## 2020-01-01 ENCOUNTER — Ambulatory Visit: Payer: Medicare Other | Admitting: Family Medicine

## 2020-01-09 ENCOUNTER — Telehealth: Payer: Self-pay

## 2020-01-09 NOTE — Telephone Encounter (Signed)
Patient called needing clarification as to rather or not her Synthroid dose should be 150 or 175 mcg. I do not see where there is a message showing that we increased the dose however 175 mcg is what was sent in on 12/27/2019.

## 2020-01-09 NOTE — Telephone Encounter (Signed)
due for AWV. done last on 02/17/2018. LM for pt to call back to schedule the appointment.

## 2020-01-10 ENCOUNTER — Other Ambulatory Visit: Payer: Self-pay

## 2020-01-10 MED ORDER — LEVOTHYROXINE SODIUM 150 MCG PO TABS: 150.0000 ug | ORAL_TABLET | Freq: Every day | ORAL | 1 refills | Status: DC

## 2020-01-10 NOTE — Telephone Encounter (Signed)
Should be on sythroid 150 mcg once daily in am. 90/1 kc

## 2020-01-10 NOTE — Telephone Encounter (Signed)
Patient informed. 

## 2020-01-17 ENCOUNTER — Ambulatory Visit (INDEPENDENT_AMBULATORY_CARE_PROVIDER_SITE_OTHER): Payer: Medicare Other | Admitting: Family Medicine

## 2020-01-17 ENCOUNTER — Other Ambulatory Visit: Payer: Self-pay

## 2020-01-17 ENCOUNTER — Encounter: Payer: Self-pay | Admitting: Family Medicine

## 2020-01-17 VITALS — BP 132/70 | HR 61 | Temp 97.5°F | Ht 68.0 in | Wt 298.0 lb

## 2020-01-17 DIAGNOSIS — L4050 Arthropathic psoriasis, unspecified: Secondary | ICD-10-CM

## 2020-01-17 DIAGNOSIS — E119 Type 2 diabetes mellitus without complications: Secondary | ICD-10-CM | POA: Diagnosis not present

## 2020-01-17 DIAGNOSIS — E1169 Type 2 diabetes mellitus with other specified complication: Secondary | ICD-10-CM | POA: Diagnosis not present

## 2020-01-17 DIAGNOSIS — Z23 Encounter for immunization: Secondary | ICD-10-CM

## 2020-01-17 DIAGNOSIS — F5101 Primary insomnia: Secondary | ICD-10-CM

## 2020-01-17 DIAGNOSIS — E782 Mixed hyperlipidemia: Secondary | ICD-10-CM

## 2020-01-17 DIAGNOSIS — I10 Essential (primary) hypertension: Secondary | ICD-10-CM

## 2020-01-17 DIAGNOSIS — E038 Other specified hypothyroidism: Secondary | ICD-10-CM

## 2020-01-17 DIAGNOSIS — G720 Drug-induced myopathy: Secondary | ICD-10-CM

## 2020-01-17 NOTE — Progress Notes (Signed)
Subjective:  Patient ID: Whitney Eaton, female    DOB: 10/12/1950  Age: 69 y.o. MRN: [id number]  Chief Complaint  Patient presents with  . Hyperlipidemia  . Hypertension  . Hypothyroidism    HPI Hypertension: Currently taking valsartan/HCTZ 320/20 5 mg once daily and atenolol 25 mg once daily.  She has been watching her diet and decreasing her portion size.  She is walking for exercise.  Hyperlipidemia: Patient has been intolerant to statins and fenofibrate.  She is currently on no cholesterol medications.  Her last cholesterol showed LDL 116, triglycerides 224.  Type 2 diabetes complicated by hyperlipidemia: I tried the patient on Jardiance but she did not tolerate this.  She is currently taking Metformin 1000 mg twice daily.  She has lost weight with watching her portion sizes.  She checks her feet daily.  She checks her sugars 1-2 times per day.  Range from 110-145.  Last A1c was 7.3 in July 2021.  Hypothyroidism: Currently taking synthroid 110mg daily.  Last TSH was therapeutic 6 months ago.  Patient does have psoriatic arthritis which is well controlled on meloxicam 15 mg once daily, and Taltz injections every 2 weeks.  Patient has chronic insomnia which is well controlled on Ambien.  Current Outpatient Medications on File Prior to Visit  Medication Sig Dispense Refill  . ACCU-CHEK GUIDE test strip CHECK FASTING BLOOD SUGAR 50 strip 4  . Accu-Chek Softclix Lancets lancets CHECK FASTING BLOOD SUGAR ONCE DAILY 100 each 3  . atenolol (TENORMIN) 25 MG tablet Take 1 tablet (25 mg total) by mouth daily. 30 tablet 2  . blood glucose meter kit and supplies KIT Check sugars fasting. E11.69 1 each 0  . Ixekizumab (TALTZ) 80 MG/ML SOAJ Inject into the skin. Every 2 weeks. Done by DPayton Mccallum    . levothyroxine (SYNTHROID) 150 MCG tablet Take 1 tablet (150 mcg total) by mouth daily. 90 tablet 1  . meloxicam (MOBIC) 15 MG tablet Take 1 tablet (15 mg total) by mouth daily. 90 tablet 1  . metFORMIN  (GLUCOPHAGE) 1000 MG tablet Take 1,000 mg by mouth 2 (two) times daily with a meal.    . Multiple Vitamin (MULTI-VITAMIN) tablet Take by mouth.    . Omega-3 1000 MG CAPS Take by mouth.    . valsartan-hydrochlorothiazide (DIOVAN-HCT) 320-25 MG tablet TAKE 1 TABLET BY MOUTH DAILY 90 tablet 0  . Vitamin D, Ergocalciferol, (DRISDOL) 1.25 MG (50000 UNIT) CAPS capsule Take 1 capsule (50,000 Units total) by mouth every 7 (seven) days. 13 capsule 3  . zolpidem (AMBIEN) 10 MG tablet TAKE 1 TABLET BY MOUTH EVERY DAY AT BEDTIME 30 tablet 3   No current facility-administered medications on file prior to visit.   Past Medical History:  Diagnosis Date  . Diabetes ([place]   . Hyperlipemia   . Hypertension    Past Surgical History:  Procedure Laterality Date  . CHOLECYSTECTOMY    . HYSTERECTOMY ABDOMINAL WITH SALPINGECTOMY      Family History  Problem Relation Age of Onset  . Alzheimer's disease Mother   . Breast cancer Mother   . CAD Father   . Diabetes Father    Social History   Socioeconomic History  . Marital status: Married    Spouse name: Not on file  . Number of children: 2  . Years of education: Not on file  . Highest education level: Not on file  Occupational History  . Occupation: homemaker  Tobacco Use  . Smoking status: Never Smoker  .  Smokeless tobacco: Never Used  Vaping Use  . Vaping Use: Never used  Substance and Sexual Activity  . Alcohol use: Never  . Drug use: Never  . Sexual activity: Not on file  Other Topics Concern  . Not on file  Social History Narrative  . Not on file   Social Determinants of Health   Financial Resource Strain:   . Difficulty of Paying Living Expenses: Not on file  Food Insecurity:   . Worried About Charity fundraiser in the Last Year: Not on file  . Ran Out of Food in the Last Year: Not on file  Transportation Needs:   . Lack of Transportation (Medical): Not on file  . Lack of Transportation (Non-Medical): Not on file  Physical  Activity:   . Days of Exercise per Week: Not on file  . Minutes of Exercise per Session: Not on file  Stress:   . Feeling of Stress : Not on file  Social Connections:   . Frequency of Communication with Friends and Family: Not on file  . Frequency of Social Gatherings with Friends and Family: Not on file  . Attends Religious Services: Not on file  . Active Member of Clubs or Organizations: Not on file  . Attends Archivist Meetings: Not on file  . Marital Status: Not on file    Review of Systems  Constitutional: Negative for chills, fatigue and fever.  HENT: Negative for congestion, ear pain, rhinorrhea and sore throat.   Respiratory: Negative for cough and shortness of breath.   Cardiovascular: Negative for chest pain.  Gastrointestinal: Negative for abdominal pain, constipation, diarrhea, nausea and vomiting.  Genitourinary: Negative for dysuria and urgency.  Musculoskeletal: Positive for arthralgias and back pain. Negative for myalgias.  Neurological: Negative for dizziness, weakness, light-headedness and headaches.  Psychiatric/Behavioral: Negative for dysphoric mood. The patient is not nervous/anxious.      Objective:  BP 132/70   Pulse 61   Temp (!) 97.5 F (36.4 C)   Ht _0  (1.727 m)   Wt 298 lb (135.2 kg)   SpO2 97%   BMI 45.31 kg/m   BP/Weight 01/17/2020 09/25/2019 9/37/1696  Systolic BP 789 381 017  Diastolic BP 70 80 70  Wt. (Lbs) 298 313 315  BMI 45.31 47.59 -    Physical Exam Vitals reviewed.  Constitutional:      Appearance: Normal appearance. She is normal weight.  Neck:     Vascular: No carotid bruit.  Cardiovascular:     Rate and Rhythm: Normal rate and regular rhythm.     Pulses: Normal pulses.     Heart sounds: Normal heart sounds.  Pulmonary:     Effort: Pulmonary effort is normal. No respiratory distress.     Breath sounds: Normal breath sounds.  Abdominal:     General: Abdomen is flat. Bowel sounds are normal.      Palpations: Abdomen is soft.     Tenderness: There is no abdominal tenderness.  Neurological:     Mental Status: She is alert and oriented to person, place, and time.  Psychiatric:        Mood and Affect: Mood normal.        Behavior: Behavior normal.    Diabetic Foot Exam - Simple   Simple Foot Form Diabetic Foot exam was performed with the following findings: Yes 01/17/2020 10:08 PM  Visual Inspection No deformities, no ulcerations, no other skin breakdown bilaterally: Yes Sensation Testing Intact to touch and monofilament  testing bilaterally: Yes Pulse Check Posterior Tibialis and Dorsalis pulse intact bilaterally: Yes Comments      Lab Results  Component Value Date   WBC 7.6 09/25/2019   HGB 13.7 09/25/2019   HCT 40.7 09/25/2019   PLT 239 09/25/2019   GLUCOSE 133 (H) 09/25/2019   CHOL 198 09/25/2019   TRIG 224 (H) 09/25/2019   HDL 43 09/25/2019   LDLCALC 116 (H) 09/25/2019   ALT 27 09/25/2019   AST 31 09/25/2019   NA 139 09/25/2019   K 3.8 09/25/2019   CL 98 09/25/2019   CREATININE 0.74 09/25/2019   BUN 15 09/25/2019   CO2 27 09/25/2019   TSH 0.592 07/20/2019   HGBA1C 7.3 (H) 09/25/2019   MICROALBUR 10 09/25/2019      Assessment & Plan:   1. Mixed hyperlipidemia Not at goal.  Consider zetia.  Consider lovaza or vascepa.  Low fat diet and exercise. Labs drawn. - Lipid panel  2. Combined hyperlipidemia associated with type 2 diabetes mellitus (HCC) Control: good Recommend check sugars fasting daily. Recommend check feet daily. Recommend annual eye exams. Medicines: no change at this time. Consider GLP1.  Continue to work on eating a healthy diet and exercise.  Labs drawn today.   - Hemoglobin A1c - CBC with Differential/Platelet  3. Benign hypertension Well controlled.  No changes to medicines.  Continue to work on eating a healthy diet and exercise.  Labs drawn today.  - Comprehensive metabolic panel  4. Other specified hypothyroidism -  TSH  5. Need for immunization against influenza - Flu Vaccine QUAD High Dose(Fluad)  6. Primary insomnia The current medical regimen is effective;  continue present plan and medications.  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 Psoriatic arthritis ([place])  Management per specialist.  The current medical regimen is effective;  continue present plan and medications.  9. Morbid obesity with BMI 45.31 with serious comorbities Improved. Doing great! Encouragement given.   Orders Placed This Encounter  Procedures  . Flu Vaccine QUAD High Dose(Fluad)  . Lipid panel  . Hemoglobin A1c  . Comprehensive metabolic panel  . CBC with Differential/Platelet  . TSH     I spent 30 minutes dedicated to the care of this patient on the date of this encounter to include face-to-face time with the patient, as well as: Preparing to see the patient (review of tests/labs). Obtaining and/or reviewing separately obtained history. Performing a medically appropriate examination and/or evaluation. Counseling and educating the patient on eating healthy Ordering labs. Documenting clinical information in the electronic or other health record.  My nursing staff have aided in the documentation of this note on the behalf of Rochel Brome, MD,as directed by  Rochel Brome, MD and thoroughly reviewed by Rochel Brome, MD.  Follow-up: Return in about 3 months (around 04/18/2020) for fasting.  An After Visit Summary was printed and given to the patient.  Rochel Brome, MD [facility] [phone]

## 2020-01-18 LAB — LIPID PANEL
Chol/HDL Ratio: 5 ratio — ABNORMAL HIGH (ref 0.0–4.4)
Cholesterol, Total: 208 mg/dL — ABNORMAL HIGH (ref 100–199)
HDL: 42 mg/dL (ref >39)
LDL Chol Calc (NIH): 108 mg/dL — ABNORMAL HIGH (ref 0–99)
Triglycerides: 338 mg/dL — ABNORMAL HIGH (ref 0–149)
VLDL Cholesterol Cal: 58 mg/dL — ABNORMAL HIGH (ref 5–40)

## 2020-01-18 LAB — CBC WITH DIFFERENTIAL/PLATELET
Basophils Absolute: 0.1 10*3/uL (ref 0.0–0.2)
Basos: 1 %
EOS (ABSOLUTE): 0.2 10*3/uL (ref 0.0–0.4)
Eos: 2 %
Hematocrit: 40.4 % (ref 34.0–46.6)
Hemoglobin: 13.5 g/dL (ref 11.1–15.9)
Immature Grans (Abs): 0.1 10*3/uL (ref 0.0–0.1)
Immature Granulocytes: 1 %
Lymphocytes Absolute: 2.5 10*3/uL (ref 0.7–3.1)
Lymphs: 27 %
MCH: 30.9 pg (ref 26.6–33.0)
MCHC: 33.4 g/dL (ref 31.5–35.7)
MCV: 92 fL (ref 79–97)
Monocytes Absolute: 0.7 10*3/uL (ref 0.1–0.9)
Monocytes: 7 %
Neutrophils Absolute: 6.1 10*3/uL (ref 1.4–7.0)
Neutrophils: 62 %
Platelets: 231 10*3/uL (ref 150–450)
RBC: 4.37 x10E6/uL (ref 3.77–5.28)
RDW: 12.6 % (ref 11.7–15.4)
WBC: 9.6 10*3/uL (ref 3.4–10.8)

## 2020-01-18 LAB — TSH: TSH: 3.51 u[IU]/mL (ref 0.450–4.500)

## 2020-01-18 LAB — COMPREHENSIVE METABOLIC PANEL
ALT: 25 IU/L (ref 0–32)
AST: 24 IU/L (ref 0–40)
Albumin/Globulin Ratio: 1.4 (ref 1.2–2.2)
Albumin: 4.2 g/dL (ref 3.8–4.8)
Alkaline Phosphatase: 82 IU/L (ref 44–121)
BUN/Creatinine Ratio: 15 (ref 12–28)
BUN: 13 mg/dL (ref 8–27)
Bilirubin Total: 0.6 mg/dL (ref 0.0–1.2)
CO2: 28 mmol/L (ref 20–29)
Calcium: 9.7 mg/dL (ref 8.7–10.3)
Chloride: 93 mmol/L — ABNORMAL LOW (ref 96–106)
Creatinine, Ser: 0.85 mg/dL (ref 0.57–1.00)
GFR calc Af Amer: 81 mL/min/{1.73_m2} (ref >59)
GFR calc non Af Amer: 71 mL/min/{1.73_m2} (ref >59)
Globulin, Total: 3 g/dL (ref 1.5–4.5)
Glucose: 119 mg/dL — ABNORMAL HIGH (ref 65–99)
Potassium: 3.7 mmol/L (ref 3.5–5.2)
Sodium: 136 mmol/L (ref 134–144)
Total Protein: 7.2 g/dL (ref 6.0–8.5)

## 2020-01-18 LAB — HEMOGLOBIN A1C
Est. average glucose Bld gHb Est-mCnc: 146 mg/dL
Hgb A1c MFr Bld: 6.7 % — ABNORMAL HIGH (ref 4.8–5.6)

## 2020-01-18 LAB — CARDIOVASCULAR RISK ASSESSMENT

## 2020-02-06 ENCOUNTER — Encounter: Payer: Self-pay | Admitting: Family Medicine

## 2020-02-11 ENCOUNTER — Encounter: Payer: Self-pay | Admitting: Family Medicine

## 2020-02-11 NOTE — Progress Notes (Signed)
a 

## 2020-02-12 ENCOUNTER — Other Ambulatory Visit: Payer: Self-pay | Admitting: Family Medicine

## 2020-02-12 DIAGNOSIS — I10 Essential (primary) hypertension: Secondary | ICD-10-CM

## 2020-02-25 ENCOUNTER — Other Ambulatory Visit: Payer: Self-pay | Admitting: Physician Assistant

## 2020-02-25 DIAGNOSIS — I10 Essential (primary) hypertension: Secondary | ICD-10-CM

## 2020-03-13 ENCOUNTER — Other Ambulatory Visit: Payer: Self-pay

## 2020-03-14 MED ORDER — METFORMIN HCL 1000 MG PO TABS: 1000.0000 mg | ORAL_TABLET | Freq: Two times a day (BID) | ORAL | 0 refills | Status: DC

## 2020-03-26 ENCOUNTER — Other Ambulatory Visit: Payer: Self-pay | Admitting: Family Medicine

## 2020-03-27 ENCOUNTER — Other Ambulatory Visit: Payer: Self-pay | Admitting: Family Medicine

## 2020-03-27 MED ORDER — LEVOTHYROXINE SODIUM 150 MCG PO TABS: 150.0000 ug | ORAL_TABLET | Freq: Every day | ORAL | 1 refills | Status: DC

## 2020-03-28 DIAGNOSIS — G4733 Obstructive sleep apnea (adult) (pediatric): Secondary | ICD-10-CM | POA: Diagnosis not present

## 2020-04-10 ENCOUNTER — Other Ambulatory Visit: Payer: Self-pay

## 2020-04-10 ENCOUNTER — Other Ambulatory Visit: Payer: Self-pay | Admitting: Family Medicine

## 2020-04-10 DIAGNOSIS — E038 Other specified hypothyroidism: Secondary | ICD-10-CM

## 2020-04-10 MED ORDER — VITAMIN D (ERGOCALCIFEROL) 1.25 MG (50000 UNIT) PO CAPS: 50000.0000 [IU] | ORAL_CAPSULE | ORAL | 3 refills | Status: DC

## 2020-04-10 MED ORDER — ZOLPIDEM TARTRATE 10 MG PO TABS: 10.0000 mg | ORAL_TABLET | Freq: Every day | ORAL | 3 refills | Status: DC

## 2020-04-10 NOTE — Telephone Encounter (Signed)
Pt calling requesting refills on medications. Pt stated she comes in 2/21 for labs.

## 2020-04-20 NOTE — Progress Notes (Signed)
 Subjective:  Patient ID: Whitney Eaton, female    DOB: 04/14/1950  Age: 70 y.o. MRN: [id number]  Chief Complaint  Patient presents with  . Hyperlipidemia  . Hypertension    HPI Vitamin D insufficiency Patient takes vitamin D 50000 units every week. Psoriatic arthritis: Currently on taltz 80 mg/ml Mixed hyperlipidemia: Patient is taking fish oil; Intolerant to statins. Pt is not eating healthy. Slightly active. Combined hyperlipidemia associated with type 2 diabetes mellitus (HCC) Currently on Metformin 1000 mg one twice a day. Tries to avoid sweets. Eating fruits, vegetables, protein. Patient checks blood sugar once a day. 95-170. Sugars around 170 are usually postprandial. Checks feet daily. Gets annual eye exam.  Benign hypertension Patient is taking atenolol 25 mg daily, valsartan-HCTZ 320-25 mg daily. Bps 120s/80s.  Other specified hypothyroidism Patient is taking levothyroxine 150 mcg everyday. Primary insomnia: Controlled with zolpidem 10 mg at bedtime. Patient is seeing urology. 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 Previously saw Dr. Caberwal.    Current Outpatient Medications on File Prior to Visit  Medication Sig Dispense Refill  . ACCU-CHEK GUIDE test strip CHECK FASTING BLOOD SUGAR 50 strip 4  . Accu-Chek Softclix Lancets lancets CHECK FASTING BLOOD SUGAR ONCE DAILY 100 each 3  . atenolol (TENORMIN) 25 MG tablet TAKE 1 TABLET(25 MG) BY MOUTH DAILY 30 tablet 2  . blood glucose meter kit and supplies KIT Check sugars fasting. E11.69 1 each 0  . Ixekizumab (TALTZ) 80 MG/ML SOAJ Inject into the skin. Every 2 weeks. Done by Derm.    . levothyroxine (SYNTHROID) 150 MCG tablet Take 1 tablet (150 mcg total) by mouth daily. 90 tablet 1  . meloxicam (MOBIC) 15 MG tablet Take 1 tablet (15 mg total) by mouth daily. 90 tablet 1  . metFORMIN (GLUCOPHAGE) 1000 MG tablet Take 1 tablet (1,000 mg total) by mouth 2 (two) times daily with a meal. 180 tablet 0  . Multiple Vitamin  (MULTI-VITAMIN) tablet Take by mouth.    . Omega-3 1000 MG CAPS Take by mouth.    . valsartan-hydrochlorothiazide (DIOVAN-HCT) 320-25 MG tablet TAKE 1 TABLET BY MOUTH DAILY 90 tablet 0  . Vitamin D, Ergocalciferol, (DRISDOL) 1.25 MG (50000 UNIT) CAPS capsule Take 1 capsule (50,000 Units total) by mouth every 7 (seven) days. 13 capsule 3  . zolpidem (AMBIEN) 10 MG tablet Take 1 tablet (10 mg total) by mouth at bedtime. 30 tablet 3   No current facility-administered medications on file prior to visit.   Past Medical History:  Diagnosis Date  . Diabetes (HCC)   . Hyperlipemia   . Hypertension    Past Surgical History:  Procedure Laterality Date  . CHOLECYSTECTOMY    . HYSTERECTOMY ABDOMINAL WITH SALPINGECTOMY      Family History  Problem Relation Age of Onset  . Alzheimer's disease Mother   . Breast cancer Mother   . CAD Father   . Diabetes Father    Social History   Socioeconomic History  . Marital status: Married    Spouse name: Not on file  . Number of children: 2  . Years of education: Not on file  . Highest education level: Not on file  Occupational History  . Occupation: homemaker  Tobacco Use  . Smoking status: Never Smoker  . Smokeless tobacco: Never Used  Vaping Use  . Vaping Use: Never used  Substance and Sexual Activity  . Alcohol use: Never  . Drug use: Never  . Sexual activity: Not on file  Other   Topics Concern  . Not on file  Social History Narrative  . Not on file   Social Determinants of Health   Financial Resource Strain: Not on file  Food Insecurity: Not on file  Transportation Needs: Not on file  Physical Activity: Not on file  Stress: Not on file  Social Connections: Not on file    Review of Systems  Constitutional: Negative for chills, fatigue and fever.  HENT: Negative for congestion, ear pain and sore throat.   Respiratory: Negative for cough and shortness of breath.   Cardiovascular: Negative for chest pain and palpitations.   Gastrointestinal: Negative for abdominal pain, constipation, diarrhea, nausea and vomiting.  Endocrine: Negative for polydipsia, polyphagia and polyuria.  Genitourinary: Negative for difficulty urinating and dysuria.  Musculoskeletal: Positive for arthralgias, back pain and myalgias.  Skin: Negative for rash.  Neurological: Negative for headaches.  Psychiatric/Behavioral: Negative for dysphoric mood. The patient is not nervous/anxious.      Objective:  BP 140/78   Pulse 64   Temp (!) 97.3 F (36.3 C)   Resp 16   Ht 5' 8" (1.727 m)   Wt 296 lb (134.3 kg)   BMI 45.01 kg/m   BP/Weight 04/22/2020 01/17/2020 5/57/3220  Systolic BP 254 270 623  Diastolic BP 78 70 80  Wt. (Lbs) 296 298 313  BMI 45.01 45.31 47.59    Physical Exam Vitals reviewed.  Constitutional:      Appearance: Normal appearance.  Neck:     Vascular: No carotid bruit.  Cardiovascular:     Rate and Rhythm: Normal rate and regular rhythm.     Pulses: Normal pulses.     Heart sounds: Normal heart sounds.  Pulmonary:     Effort: Pulmonary effort is normal.     Breath sounds: Normal breath sounds.  Abdominal:     General: Bowel sounds are normal.     Palpations: Abdomen is soft.     Tenderness: There is no abdominal tenderness.  Neurological:     Mental Status: She is alert and oriented to person, place, and time.  Psychiatric:        Mood and Affect: Mood normal.        Behavior: Behavior normal.     Diabetic Foot Exam - Simple   Simple Foot Form Diabetic Foot exam was performed with the following findings: Yes 04/22/2020 11:50 AM  Visual Inspection No deformities, no ulcerations, no other skin breakdown bilaterally: Yes Sensation Testing Intact to touch and monofilament testing bilaterally: Yes Pulse Check Posterior Tibialis and Dorsalis pulse intact bilaterally: Yes Comments      Lab Results  Component Value Date   WBC 7.5 04/22/2020   HGB 13.1 04/22/2020   HCT 39.9 04/22/2020   PLT 206  04/22/2020   GLUCOSE 97 04/22/2020   CHOL 183 04/22/2020   TRIG 166 (H) 04/22/2020   HDL 43 04/22/2020   LDLCALC 111 (H) 04/22/2020   ALT 24 04/22/2020   AST 23 04/22/2020   NA 138 04/22/2020   K 3.9 04/22/2020   CL 98 04/22/2020   CREATININE 0.83 04/22/2020   BUN 20 04/22/2020   CO2 24 04/22/2020   TSH 3.510 01/17/2020   HGBA1C 6.5 (H) 04/22/2020   MICROALBUR 10 09/25/2019      Assessment & Plan:   1. Vitamin D insufficiency The current medical regimen is effective;  continue present plan and medications. - VITAMIN D 25 Hydroxy (Vit-D Deficiency, Fractures)  2. Psoriatic arthritis ([place]) The current medical regimen  is effective;  continue present plan and medications. Management per specialist (dermatology.)   3. Mixed hyperlipidemia Not at goal Continue fish oil.  Intolerant to statins and zetia. Continue to work on eating a healthy diet and exercise.  Labs drawn today.  - Lipid panel  4. Combined hyperlipidemia associated with type 2 diabetes mellitus (HCC) Control: good Recommend check sugars fasting daily. Recommend check feet daily. Recommend annual eye exams. Medicines: no changes. Continue to work on eating a healthy diet and exercise.  Labs drawn today.   - HBa1C - urine microalbumin: 10.  5. Benign hypertension Well controlled.  No changes to medicines.  Continue to work on eating a healthy diet and exercise.  Labs drawn today.  - Comprehensive metabolic panel - CBC with Differential/Platelet  6. Other specified hypothyroidism The current medical regimen is effective;  continue present plan and medications.  7. Primary insomnia The current medical regimen is effective;  continue present plan and medications.  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   9. Other urinary incontinence Follow up with urology. Has appt with Dr. Chao's office.  - bethanechol (URECHOLINE) 25 MG tablet; Take 1 tablet (25 mg total) by mouth 3 (three) times  daily.  Dispense: 1 tablet; Refill: 0   Meds ordered this encounter  Medications  . bethanechol (URECHOLINE) 25 MG tablet    Sig: Take 1 tablet (25 mg total) by mouth 3 (three) times daily.    Dispense:  1 tablet    Refill:  0    Orders Placed This Encounter  Procedures  . Comprehensive metabolic panel  . Hemoglobin A1c  . Lipid panel  . CBC with Differential/Platelet  . VITAMIN D 25 Hydroxy (Vit-D Deficiency, Fractures)  . Cardiovascular Risk Assessment  . POCT UA - Microalbumin     Follow-up: Return in about 3 months (around 07/20/2020) for fasting.  An After Visit Summary was printed and given to the patient.   , MD  Family Practice [phone] 

## 2020-04-22 ENCOUNTER — Ambulatory Visit (INDEPENDENT_AMBULATORY_CARE_PROVIDER_SITE_OTHER): Payer: Medicare Other | Admitting: Family Medicine

## 2020-04-22 ENCOUNTER — Other Ambulatory Visit: Payer: Self-pay

## 2020-04-22 VITALS — BP 140/78 | HR 64 | Temp 97.3°F | Resp 16 | Ht 68.0 in | Wt 296.0 lb

## 2020-04-22 DIAGNOSIS — E559 Vitamin D deficiency, unspecified: Secondary | ICD-10-CM | POA: Diagnosis not present

## 2020-04-22 DIAGNOSIS — E119 Type 2 diabetes mellitus without complications: Secondary | ICD-10-CM | POA: Diagnosis not present

## 2020-04-22 DIAGNOSIS — E1169 Type 2 diabetes mellitus with other specified complication: Secondary | ICD-10-CM

## 2020-04-22 DIAGNOSIS — F5101 Primary insomnia: Secondary | ICD-10-CM | POA: Diagnosis not present

## 2020-04-22 DIAGNOSIS — I10 Essential (primary) hypertension: Secondary | ICD-10-CM

## 2020-04-22 DIAGNOSIS — E038 Other specified hypothyroidism: Secondary | ICD-10-CM | POA: Diagnosis not present

## 2020-04-22 DIAGNOSIS — G720 Drug-induced myopathy: Secondary | ICD-10-CM

## 2020-04-22 DIAGNOSIS — L4050 Arthropathic psoriasis, unspecified: Secondary | ICD-10-CM | POA: Diagnosis not present

## 2020-04-22 DIAGNOSIS — N39498 Other specified urinary incontinence: Secondary | ICD-10-CM

## 2020-04-22 DIAGNOSIS — E782 Mixed hyperlipidemia: Secondary | ICD-10-CM

## 2020-04-22 MED ORDER — BETHANECHOL CHLORIDE 25 MG PO TABS: 25.0000 mg | ORAL_TABLET | Freq: Three times a day (TID) | ORAL | 0 refills | Status: DC

## 2020-04-23 LAB — LIPID PANEL
Chol/HDL Ratio: 4.3 ratio (ref 0.0–4.4)
Cholesterol, Total: 183 mg/dL (ref 100–199)
HDL: 43 mg/dL (ref >39)
LDL Chol Calc (NIH): 111 mg/dL — ABNORMAL HIGH (ref 0–99)
Triglycerides: 166 mg/dL — ABNORMAL HIGH (ref 0–149)
VLDL Cholesterol Cal: 29 mg/dL (ref 5–40)

## 2020-04-23 LAB — CBC WITH DIFFERENTIAL/PLATELET
Basophils Absolute: 0.1 10*3/uL (ref 0.0–0.2)
Basos: 1 %
EOS (ABSOLUTE): 0.1 10*3/uL (ref 0.0–0.4)
Eos: 2 %
Hematocrit: 39.9 % (ref 34.0–46.6)
Hemoglobin: 13.1 g/dL (ref 11.1–15.9)
Immature Grans (Abs): 0 10*3/uL (ref 0.0–0.1)
Immature Granulocytes: 0 %
Lymphocytes Absolute: 1.6 10*3/uL (ref 0.7–3.1)
Lymphs: 21 %
MCH: 31.2 pg (ref 26.6–33.0)
MCHC: 32.8 g/dL (ref 31.5–35.7)
MCV: 95 fL (ref 79–97)
Monocytes Absolute: 0.5 10*3/uL (ref 0.1–0.9)
Monocytes: 7 %
Neutrophils Absolute: 5.2 10*3/uL (ref 1.4–7.0)
Neutrophils: 69 %
Platelets: 206 10*3/uL (ref 150–450)
RBC: 4.2 x10E6/uL (ref 3.77–5.28)
RDW: 13.1 % (ref 11.7–15.4)
WBC: 7.5 10*3/uL (ref 3.4–10.8)

## 2020-04-23 LAB — HEMOGLOBIN A1C
Est. average glucose Bld gHb Est-mCnc: 140 mg/dL
Hgb A1c MFr Bld: 6.5 % — ABNORMAL HIGH (ref 4.8–5.6)

## 2020-04-23 LAB — COMPREHENSIVE METABOLIC PANEL
ALT: 24 IU/L (ref 0–32)
AST: 23 IU/L (ref 0–40)
Albumin/Globulin Ratio: 1.4 (ref 1.2–2.2)
Albumin: 4.2 g/dL (ref 3.8–4.8)
Alkaline Phosphatase: 86 IU/L (ref 44–121)
BUN/Creatinine Ratio: 24 (ref 12–28)
BUN: 20 mg/dL (ref 8–27)
Bilirubin Total: 0.6 mg/dL (ref 0.0–1.2)
CO2: 24 mmol/L (ref 20–29)
Calcium: 9.7 mg/dL (ref 8.7–10.3)
Chloride: 98 mmol/L (ref 96–106)
Creatinine, Ser: 0.83 mg/dL (ref 0.57–1.00)
GFR calc Af Amer: 83 mL/min/{1.73_m2} (ref >59)
GFR calc non Af Amer: 72 mL/min/{1.73_m2} (ref >59)
Globulin, Total: 3 g/dL (ref 1.5–4.5)
Glucose: 97 mg/dL (ref 65–99)
Potassium: 3.9 mmol/L (ref 3.5–5.2)
Sodium: 138 mmol/L (ref 134–144)
Total Protein: 7.2 g/dL (ref 6.0–8.5)

## 2020-04-23 LAB — VITAMIN D 25 HYDROXY (VIT D DEFICIENCY, FRACTURES): Vit D, 25-Hydroxy: 47.4 ng/mL (ref 30.0–100.0)

## 2020-04-23 LAB — CARDIOVASCULAR RISK ASSESSMENT

## 2020-04-24 ENCOUNTER — Encounter: Payer: Self-pay | Admitting: Family Medicine

## 2020-04-24 LAB — POCT UA - MICROALBUMIN: Microalbumin Ur, POC: 10 mg/L

## 2020-04-25 ENCOUNTER — Other Ambulatory Visit: Payer: Self-pay | Admitting: Physician Assistant

## 2020-04-25 ENCOUNTER — Other Ambulatory Visit: Payer: Self-pay | Admitting: Family Medicine

## 2020-04-25 DIAGNOSIS — R3915 Urgency of urination: Secondary | ICD-10-CM | POA: Diagnosis not present

## 2020-04-25 DIAGNOSIS — R8290 Unspecified abnormal findings in urine: Secondary | ICD-10-CM | POA: Diagnosis not present

## 2020-04-25 DIAGNOSIS — Z79899 Other long term (current) drug therapy: Secondary | ICD-10-CM | POA: Diagnosis not present

## 2020-04-25 DIAGNOSIS — R339 Retention of urine, unspecified: Secondary | ICD-10-CM | POA: Diagnosis not present

## 2020-04-29 ENCOUNTER — Telehealth: Payer: Self-pay

## 2020-04-29 ENCOUNTER — Other Ambulatory Visit: Payer: Self-pay

## 2020-04-29 DIAGNOSIS — L4050 Arthropathic psoriasis, unspecified: Secondary | ICD-10-CM

## 2020-04-29 NOTE — Telephone Encounter (Signed)
Please send to [facility] rheumatology for psoriatic arthritis.  Please put in referral. I believe she only sees dermatology. Kc

## 2020-04-29 NOTE — Telephone Encounter (Signed)
Pt left message requesting information on rheumatologist. Pt states she is in pain she would like to see specialist. Also would like Dr. Alyse Low recommendation for one.  Pt was seen 04/22/20. Does pt need another appointment for referral?  Harrell Lark 04/29/20 10:12 AM

## 2020-04-29 NOTE — Telephone Encounter (Signed)
Called pt. Pt is aware and VU.   Whitney Eaton 04/29/20 4:25 PM

## 2020-05-02 ENCOUNTER — Other Ambulatory Visit: Payer: Self-pay | Admitting: Family Medicine

## 2020-05-02 DIAGNOSIS — I10 Essential (primary) hypertension: Secondary | ICD-10-CM

## 2020-05-06 DIAGNOSIS — M62830 Muscle spasm of back: Secondary | ICD-10-CM | POA: Diagnosis not present

## 2020-05-06 DIAGNOSIS — M5442 Lumbago with sciatica, left side: Secondary | ICD-10-CM | POA: Diagnosis not present

## 2020-05-06 DIAGNOSIS — M5137 Other intervertebral disc degeneration, lumbosacral region: Secondary | ICD-10-CM | POA: Diagnosis not present

## 2020-05-06 DIAGNOSIS — M546 Pain in thoracic spine: Secondary | ICD-10-CM | POA: Diagnosis not present

## 2020-05-06 DIAGNOSIS — M9904 Segmental and somatic dysfunction of sacral region: Secondary | ICD-10-CM | POA: Diagnosis not present

## 2020-05-06 DIAGNOSIS — M5136 Other intervertebral disc degeneration, lumbar region: Secondary | ICD-10-CM | POA: Diagnosis not present

## 2020-05-06 DIAGNOSIS — M9902 Segmental and somatic dysfunction of thoracic region: Secondary | ICD-10-CM | POA: Diagnosis not present

## 2020-05-06 DIAGNOSIS — M9903 Segmental and somatic dysfunction of lumbar region: Secondary | ICD-10-CM | POA: Diagnosis not present

## 2020-05-08 ENCOUNTER — Other Ambulatory Visit: Payer: Self-pay | Admitting: Family Medicine

## 2020-05-08 DIAGNOSIS — Z1231 Encounter for screening mammogram for malignant neoplasm of breast: Secondary | ICD-10-CM

## 2020-05-09 DIAGNOSIS — M5137 Other intervertebral disc degeneration, lumbosacral region: Secondary | ICD-10-CM | POA: Diagnosis not present

## 2020-05-09 DIAGNOSIS — M9904 Segmental and somatic dysfunction of sacral region: Secondary | ICD-10-CM | POA: Diagnosis not present

## 2020-05-09 DIAGNOSIS — M546 Pain in thoracic spine: Secondary | ICD-10-CM | POA: Diagnosis not present

## 2020-05-09 DIAGNOSIS — M62830 Muscle spasm of back: Secondary | ICD-10-CM | POA: Diagnosis not present

## 2020-05-09 DIAGNOSIS — M5442 Lumbago with sciatica, left side: Secondary | ICD-10-CM | POA: Diagnosis not present

## 2020-05-09 DIAGNOSIS — M9903 Segmental and somatic dysfunction of lumbar region: Secondary | ICD-10-CM | POA: Diagnosis not present

## 2020-05-09 DIAGNOSIS — M9902 Segmental and somatic dysfunction of thoracic region: Secondary | ICD-10-CM | POA: Diagnosis not present

## 2020-05-09 DIAGNOSIS — M5136 Other intervertebral disc degeneration, lumbar region: Secondary | ICD-10-CM | POA: Diagnosis not present

## 2020-05-13 DIAGNOSIS — M9902 Segmental and somatic dysfunction of thoracic region: Secondary | ICD-10-CM | POA: Diagnosis not present

## 2020-05-13 DIAGNOSIS — M9904 Segmental and somatic dysfunction of sacral region: Secondary | ICD-10-CM | POA: Diagnosis not present

## 2020-05-13 DIAGNOSIS — M546 Pain in thoracic spine: Secondary | ICD-10-CM | POA: Diagnosis not present

## 2020-05-13 DIAGNOSIS — M5442 Lumbago with sciatica, left side: Secondary | ICD-10-CM | POA: Diagnosis not present

## 2020-05-13 DIAGNOSIS — M5136 Other intervertebral disc degeneration, lumbar region: Secondary | ICD-10-CM | POA: Diagnosis not present

## 2020-05-13 DIAGNOSIS — M9903 Segmental and somatic dysfunction of lumbar region: Secondary | ICD-10-CM | POA: Diagnosis not present

## 2020-05-13 DIAGNOSIS — M62830 Muscle spasm of back: Secondary | ICD-10-CM | POA: Diagnosis not present

## 2020-05-13 DIAGNOSIS — M5137 Other intervertebral disc degeneration, lumbosacral region: Secondary | ICD-10-CM | POA: Diagnosis not present

## 2020-05-14 ENCOUNTER — Encounter: Payer: Self-pay | Admitting: Legal Medicine

## 2020-05-14 ENCOUNTER — Ambulatory Visit: Payer: Medicare Other | Admitting: Nurse Practitioner

## 2020-05-14 ENCOUNTER — Other Ambulatory Visit: Payer: Self-pay

## 2020-05-14 ENCOUNTER — Ambulatory Visit (INDEPENDENT_AMBULATORY_CARE_PROVIDER_SITE_OTHER): Payer: Medicare Other | Admitting: Legal Medicine

## 2020-05-14 DIAGNOSIS — G20 Parkinson's disease: Secondary | ICD-10-CM | POA: Insufficient documentation

## 2020-05-14 DIAGNOSIS — M545 Low back pain, unspecified: Secondary | ICD-10-CM | POA: Diagnosis not present

## 2020-05-14 DIAGNOSIS — G309 Alzheimer's disease, unspecified: Secondary | ICD-10-CM | POA: Insufficient documentation

## 2020-05-14 DIAGNOSIS — G8929 Other chronic pain: Secondary | ICD-10-CM | POA: Insufficient documentation

## 2020-05-14 DIAGNOSIS — F0280 Dementia in other diseases classified elsewhere without behavioral disturbance: Secondary | ICD-10-CM | POA: Insufficient documentation

## 2020-05-14 MED ORDER — TRAMADOL HCL 50 MG PO TABS: 50.0000 mg | ORAL_TABLET | Freq: Three times a day (TID) | ORAL | 2 refills | Status: DC | PRN

## 2020-05-14 MED ORDER — TRIAMCINOLONE ACETONIDE 40 MG/ML IJ SUSP
80.0000 mg | Freq: Once | INTRAMUSCULAR | Status: AC
Start: 2020-05-14 — End: 2020-05-14
  Administered 2020-05-14: 80 mg via INTRAMUSCULAR

## 2020-05-14 NOTE — Patient Instructions (Signed)

## 2020-05-14 NOTE — Progress Notes (Signed)
Subjective:  Patient ID: Whitney Eaton, female    DOB: Jul 11, 1950  Age: 70 y.o. MRN: [id number]  Chief Complaint  Patient presents with  . Back Pain    Lower back pain, states she normally sees the Chiropractor but that has not given her any relief. States the pain radiates into L hip and down L leg. States it wakes her throughout the night.    HPI: patient has history of psoriatic arthritis and recurrent low back pain.  She usually sees chiropractor but it is not helping. She has been working in he yard and Games developer work.  Worse in dentist chair. using icy hot patches. carrying heavy things in left hand and flexion spine.   Current Outpatient Medications on File Prior to Visit  Medication Sig Dispense Refill  . ACCU-CHEK GUIDE test strip CHECK FASTING BLOOD SUGAR 50 strip 4  . Accu-Chek Softclix Lancets lancets CHECK FASTING BLOOD SUGAR ONCE DAILY 100 each 3  . atenolol (TENORMIN) 25 MG tablet TAKE 1 TABLET(25 MG) BY MOUTH DAILY 90 tablet 1  . blood glucose meter kit and supplies KIT Check sugars fasting. E11.69 1 each 0  . estradiol (ESTRACE) 0.1 MG/GM vaginal cream SMARTSIG:Gram(s) Vaginal 3 Times a Week    . Ixekizumab (TALTZ) 80 MG/ML SOAJ Inject into the skin. Every 2 weeks. Done by Payton Mccallum.    . levothyroxine (SYNTHROID) 150 MCG tablet Take 1 tablet (150 mcg total) by mouth daily. 90 tablet 1  . meloxicam (MOBIC) 15 MG tablet TAKE 1 TABLET(15 MG) BY MOUTH DAILY 90 tablet 1  . metFORMIN (GLUCOPHAGE) 1000 MG tablet TAKE 1 TABLET(1000 MG) BY MOUTH TWICE DAILY WITH A MEAL 180 tablet 0  . Multiple Vitamin (MULTI-VITAMIN) tablet Take by mouth.    . Omega-3 1000 MG CAPS Take by mouth.    . tamsulosin (FLOMAX) 0.4 MG CAPS capsule Take 0.4 mg by mouth 2 (two) times daily.    . valsartan-hydrochlorothiazide (DIOVAN-HCT) 320-25 MG tablet TAKE 1 TABLET BY MOUTH DAILY 90 tablet 0  . Vitamin D, Ergocalciferol, (DRISDOL) 1.25 MG (50000 UNIT) CAPS capsule Take 1 capsule (50,000 Units total) by mouth  every 7 (seven) days. 13 capsule 3  . zolpidem (AMBIEN) 10 MG tablet Take 1 tablet (10 mg total) by mouth at bedtime. 30 tablet 3   No current facility-administered medications on file prior to visit.   Past Medical History:  Diagnosis Date  . Diabetes ([place])   . Hyperlipemia   . Hypertension    Past Surgical History:  Procedure Laterality Date  . CHOLECYSTECTOMY    . HYSTERECTOMY ABDOMINAL WITH SALPINGECTOMY      Family History  Problem Relation Age of Onset  . Alzheimer's disease Mother   . Breast cancer Mother   . CAD Father   . Diabetes Father    Social History   Socioeconomic History  . Marital status: Married    Spouse name: Not on file  . Number of children: 2  . Years of education: Not on file  . Highest education level: Not on file  Occupational History  . Occupation: homemaker  Tobacco Use  . Smoking status: Never Smoker  . Smokeless tobacco: Never Used  Vaping Use  . Vaping Use: Never used  Substance and Sexual Activity  . Alcohol use: Never  . Drug use: Never  . Sexual activity: Not on file  Other Topics Concern  . Not on file  Social History Narrative  . Not on file   Social Determinants  of Health   Financial Resource Strain: Not on file  Food Insecurity: Not on file  Transportation Needs: Not on file  Physical Activity: Not on file  Stress: Not on file  Social Connections: Not on file    Review of Systems  Constitutional: Negative for activity change and appetite change.  HENT: Negative for congestion and sinus pain.   Eyes: Negative for visual disturbance.  Respiratory: Negative for chest tightness and shortness of breath.   Cardiovascular: Negative for chest pain, palpitations and leg swelling.  Gastrointestinal: Negative for abdominal distention and abdominal pain.  Genitourinary: Negative for dyspareunia and urgency.  Musculoskeletal: Positive for back pain. Negative for arthralgias.  Skin: Negative.   Neurological: Negative.    Psychiatric/Behavioral: Negative.      Objective:  BP (!) 146/84 (BP Location: Left Arm, Patient Position: Sitting, Cuff Size: Normal)   Pulse 84   Temp 97.6 F (36.4 C) (Temporal)   Resp 18   Ht 5' 8"  (1.727 m)   Wt (!) 300 lb 12.8 oz (136.4 kg)   SpO2 96%   BMI 45.74 kg/m   BP/Weight 05/14/2020 04/22/2020 85/46/2703  Systolic BP 500 938 182  Diastolic BP 84 78 70  Wt. (Lbs) 300.8 296 298  BMI 45.74 45.01 45.31    Physical Exam Musculoskeletal:     Lumbar back: Spasms and tenderness present. Decreased range of motion. Negative right straight leg raise test and negative left straight leg raise test.       Back:     Comments: Pain left SI joint.  Flexion 20 degrees and extended to 10 degrees.  Normal rotation.       Lab Results  Component Value Date   WBC 7.5 04/22/2020   HGB 13.1 04/22/2020   HCT 39.9 04/22/2020   PLT 206 04/22/2020   GLUCOSE 97 04/22/2020   CHOL 183 04/22/2020   TRIG 166 (H) 04/22/2020   HDL 43 04/22/2020   LDLCALC 111 (H) 04/22/2020   ALT 24 04/22/2020   AST 23 04/22/2020   NA 138 04/22/2020   K 3.9 04/22/2020   CL 98 04/22/2020   CREATININE 0.83 04/22/2020   BUN 20 04/22/2020   CO2 24 04/22/2020   TSH 3.510 01/17/2020   HGBA1C 6.5 (H) 04/22/2020   MICROALBUR 10 04/24/2020      Assessment & Plan:   Diagnoses and all orders for this visit: Left lumbar pain Patient is having left sided SI joint pain probably from arthritis changes and psoriatic arthritis.  I gave her tramadol and kenalog shot.  Follow up one week        I spent 25 minutes dedicated to the care of this patient on the date of this encounter to include face-to-face time with the patient, as well as:   Follow-up: Return in about 1 week (around 05/21/2020) for back.  An After Visit Summary was printed and given to the patient.  Reinaldo Meeker, MD [facility] [phone]

## 2020-05-23 DIAGNOSIS — R339 Retention of urine, unspecified: Secondary | ICD-10-CM | POA: Diagnosis not present

## 2020-05-23 DIAGNOSIS — R8290 Unspecified abnormal findings in urine: Secondary | ICD-10-CM | POA: Diagnosis not present

## 2020-05-23 DIAGNOSIS — R3915 Urgency of urination: Secondary | ICD-10-CM | POA: Diagnosis not present

## 2020-05-25 ENCOUNTER — Other Ambulatory Visit: Payer: Self-pay | Admitting: Physician Assistant

## 2020-05-25 DIAGNOSIS — I10 Essential (primary) hypertension: Secondary | ICD-10-CM

## 2020-05-27 ENCOUNTER — Ambulatory Visit
Admission: RE | Admit: 2020-05-27 | Discharge: 2020-05-27 | Disposition: A | Discharge status: Home / Self Care | Payer: Medicare Other | Source: Ambulatory Visit | Attending: Family Medicine | Admitting: Family Medicine

## 2020-05-27 ENCOUNTER — Other Ambulatory Visit: Payer: Self-pay

## 2020-05-27 DIAGNOSIS — Z1231 Encounter for screening mammogram for malignant neoplasm of breast: Secondary | ICD-10-CM | POA: Diagnosis not present

## 2020-06-04 ENCOUNTER — Other Ambulatory Visit: Payer: Self-pay

## 2020-06-04 ENCOUNTER — Ambulatory Visit (INDEPENDENT_AMBULATORY_CARE_PROVIDER_SITE_OTHER): Payer: Medicare Other

## 2020-06-04 VITALS — BP 132/78 | HR 62 | Temp 98.1°F | Resp 18 | Ht 68.0 in | Wt 293.0 lb

## 2020-06-04 DIAGNOSIS — Z803 Family history of malignant neoplasm of breast: Secondary | ICD-10-CM

## 2020-06-04 DIAGNOSIS — Z0000 Encounter for general adult medical examination without abnormal findings: Secondary | ICD-10-CM | POA: Diagnosis not present

## 2020-06-04 DIAGNOSIS — Z6841 Body Mass Index (BMI) 40.0 and over, adult: Secondary | ICD-10-CM

## 2020-06-04 NOTE — Progress Notes (Signed)
Subjective:   Whitney Eaton is a very pleasant 70 y.o. female who presents for Medicare Annual (Subsequent) preventive examination.  This wellness visit is conducted by a nurse.  The patient's medications were reviewed and reconciled since the patient's last visit.  History details were provided by the patient.  The history appears to be reliable.    Patient's last AWV was over one year ago.   Medical History: Patient history and Family history was reviewed and updated Medications, Allergies, and preventative health maintenance was reviewed and updated.   Review of Systems    Review of Systems  Constitutional: Negative.   HENT: Negative.   Eyes: Positive for visual disturbance.       Corrected with glasses  Respiratory: Negative.  Negative for cough, shortness of breath and wheezing.   Cardiovascular: Negative.  Negative for chest pain and palpitations.  Gastrointestinal: Negative.   Genitourinary: Negative.   Musculoskeletal: Positive for back pain.  Skin: Negative.   Neurological: Negative.  Negative for dizziness, numbness and headaches.  Psychiatric/Behavioral: Negative for behavioral problems, confusion, dysphoric mood and suicidal ideas. The patient is nervous/anxious.    Cardiac Risk Factors include: diabetes mellitus;advanced age (>76mn, >>90women);hypertension;obesity (BMI >30kg/m2)     Objective:     06/04/20 1018  BP: 132/78  Pulse: 62  Resp: 18  Temp: 98.1 F (36.7 C)  SpO2: 94%  Weight: 293 lb (132.9 kg)  Height: _0  (1.727 m)  PainSc: 0-No pain   Body mass index is 44.55 kg/m.  Advanced Directives 06/04/2020  Does Patient Have a Medical Advance Directive? Yes  Type of [place]  Does patient want to make changes to medical advance directive? No - Patient declined  Copy of [place] Chart? No - copy requested    Current Medications (verified) Outpatient Encounter Medications as of  06/04/2020  Medication Sig  . ACCU-CHEK GUIDE test strip CHECK FASTING BLOOD SUGAR  . Accu-Chek Softclix Lancets lancets CHECK FASTING BLOOD SUGAR ONCE DAILY  . atenolol (TENORMIN) 25 MG tablet TAKE 1 TABLET(25 MG) BY MOUTH DAILY  . blood glucose meter kit and supplies KIT Check sugars fasting. E11.69 (Patient taking differently: Check sugars fasting. E11.69 morning and evening)  . estradiol (ESTRACE) 0.1 MG/GM vaginal cream SMARTSIG:Gram(s) Vaginal 3 Times a Week  . Ixekizumab (TALTZ) 80 MG/ML SOAJ Inject into the skin. Every 2 weeks. Done by DPayton Mccallum  . levothyroxine (SYNTHROID) 150 MCG tablet Take 1 tablet (150 mcg total) by mouth daily.  . meloxicam (MOBIC) 15 MG tablet TAKE 1 TABLET(15 MG) BY MOUTH DAILY  . metFORMIN (GLUCOPHAGE) 1000 MG tablet TAKE 1 TABLET(1000 MG) BY MOUTH TWICE DAILY WITH A MEAL  . Multiple Vitamin (MULTI-VITAMIN) tablet Take by mouth.  . Omega-3 1000 MG CAPS Take by mouth.  . tamsulosin (FLOMAX) 0.4 MG CAPS capsule Take 0.4 mg by mouth 2 (two) times daily.  . traMADol (ULTRAM) 50 MG tablet Take 1 tablet (50 mg total) by mouth every 8 (eight) hours as needed.  . valsartan-hydrochlorothiazide (DIOVAN-HCT) 320-25 MG tablet TAKE 1 TABLET BY MOUTH DAILY  . Vitamin D, Ergocalciferol, (DRISDOL) 1.25 MG (50000 UNIT) CAPS capsule Take 1 capsule (50,000 Units total) by mouth every 7 (seven) days.  .Marland Kitchenzolpidem (AMBIEN) 10 MG tablet Take 1 tablet (10 mg total) by mouth at bedtime.   No facility-administered encounter medications on file as of 06/04/2020.    Allergies (verified) Crestor [rosuvastatin], Fenofibrate, Jardiance [empagliflozin], and Lipitor [atorvastatin]  History: Past Medical History:  Diagnosis Date  . Diabetes ([place])   . Hyperlipemia   . Hypertension   . Psoriasis   . Thyroid disease    Past Surgical History:  Procedure Laterality Date  . ABDOMINAL HYSTERECTOMY  1995  . CHOLECYSTECTOMY  2011  . OOPHORECTOMY  1980   Family History  Problem Relation Age  of Onset  . Alzheimer's disease Mother   . Breast cancer Mother   . Arthritis Mother   . Diabetes Mother   . CAD Father   . Diabetes Father   . Breast cancer Sister   . Glaucoma Maternal Grandmother   . Heart disease Other    Social History   Socioeconomic History  . Marital status: Married    Spouse name: Not on file  . Number of children: 2  Occupational History  . Occupation: homemaker  Tobacco Use  . Smoking status: Never Smoker  . Smokeless tobacco: Never Used   Social Determinants of Health   Financial Resource Strain: Low Risk   . Difficulty of Paying Living Expenses: Not hard at all  Food Insecurity: No Food Insecurity  . Worried About Charity fundraiser in the Last Year: Never true  . Ran Out of Food in the Last Year: Never true  Transportation Needs: No Transportation Needs  . Lack of Transportation (Medical): No  . Lack of Transportation (Non-Medical): No  Physical Activity: Insufficiently Active  . Days of Exercise per Week: 4 days  . Minutes of Exercise per Session: 20 min  Stress: No Stress Concern Present  . Feeling of Stress : Only a little  Social Connections: Socially Integrated  . Frequency of Communication with Friends and Family: More than three times a week  . Frequency of Social Gatherings with Friends and Family: More than three times a week  . Attends Religious Services: More than 4 times per year  . Active Member of Clubs or Organizations: Yes  . Attends Archivist Meetings: More than 4 times per year  . Marital Status: Married    Tobacco Counseling Counseling given: Never Smoker   Clinical Intake:  Pre-visit preparation completed: Yes  Pain : No/denies pain Pain Score: 0-No pain     BMI - recorded: 44.55 Nutritional Status: BMI > 30  Obese Nutritional Risks: None Diabetes: Yes CBG done?: No Did pt. bring in CBG monitor from home?: No  How often do you need to have someone help you when you read instructions,  pamphlets, or other written materials from your doctor or pharmacy?: 1 - Never  Interpreter Needed?: No   Activities of Daily Living In your present state of health, do you have any difficulty performing the following activities: 06/04/2020 09/25/2019  Hearing? N N  Vision? N N  Difficulty concentrating or making decisions? N N  Walking or climbing stairs? N N  Dressing or bathing? N N  Doing errands, shopping? N N  Preparing Food and eating ? N -  Using the Toilet? N -  In the past six months, have you accidently leaked urine? N -  Do you have problems with loss of bowel control? N -  Managing your Medications? N -  Managing your Finances? N -  Housekeeping or managing your Housekeeping? N -  Some recent data might be hidden    Patient Care Team: Rochel Brome, MD as PCP - General (Family Medicine) [place], [place], [place] as Referring Physician (Chiropractic Medicine) Schuyler Amor, PA-C (Dermatology) Samara Snide, Ray A, OD (  Optometry) Sherlyn Lees, FNP as Nurse Practitioner (Urology)     Assessment:   This is a routine wellness examination for Ashyah.   Dietary issues and exercise activities discussed: Current Exercise Habits: Home exercise routine, Type of exercise: walking, Time (Minutes): 30, Frequency (Times/Week): 5, Weekly Exercise (Minutes/Week): 150, Intensity: Mild, Exercise limited by: None identified  Depression Screen PHQ 2/9 Scores 06/04/2020 09/25/2019  PHQ - 2 Score 0 0    Fall Risk Fall Risk  06/04/2020 09/25/2019 01/18/2018  Falls in the past year? 0 1 0  Comment - - Emmi Telephone Survey: data to providers prior to load  Number falls in past yr: 0 1 -  Injury with Fall? 0 1 -  Risk for fall due to : No Fall Risks - -  Follow up Falls evaluation completed;Education provided;Falls prevention discussed Falls prevention discussed;Education provided;Falls evaluation completed -    FALL RISK PREVENTION PERTAINING TO THE HOME:  Any stairs in or around the home? Yes  If  so, are there any without handrails? No  Home free of loose throw rugs in walkways, pet beds, electrical cords, etc? Yes  Adequate lighting in your home to reduce risk of falls? Yes   ASSISTIVE DEVICES UTILIZED TO PREVENT FALLS:  Life alert? No  Use of a cane, walker or w/c? No  Grab bars in the bathroom? Yes  Shower chair or bench in shower? Yes  Elevated toilet seat or a handicapped toilet? Yes   Gait steady and fast without use of assistive device  Cognitive Function:     6CIT Screen 06/04/2020  What Year? 0 points  What month? 0 points  What time? 0 points  Count back from 20 0 points  Months in reverse 0 points  Repeat phrase 0 points  Total Score 0    Immunizations Immunization History  Administered Date(s) Administered  . Fluad Quad(high Dose 65+) 01/17/2020  . Influenza,inj,quad, With Preservative 12/19/2013, 10/29/2014, 10/31/2015  . Influenza-Unspecified 11/27/2011, 01/02/2013, 01/15/2017, 12/16/2017, 01/18/2019  . Moderna Sars-Covid-2 Vaccination 04/06/2019, 05/02/2019, 02/21/2020  . Pneumococcal Conjugate-13 08/05/2016  . Pneumococcal Polysaccharide-23 03/16/2012  . Zoster 01/21/2011    TDAP status: Due, Education has been provided regarding the importance of this vaccine. Advised may receive this vaccine at local pharmacy or Health Dept. Aware to provide a copy of the vaccination record if obtained from local pharmacy or Health Dept. Verbalized acceptance and understanding.  Flu Vaccine status: Up to date  Pneumococcal vaccine status: Due, Education has been provided regarding the importance of this vaccine. Advised may receive this vaccine at local pharmacy or Health Dept. Aware to provide a copy of the vaccination record if obtained from local pharmacy or Health Dept. Verbalized acceptance and understanding.  Covid-19 vaccine status: Completed vaccines  Screening Tests Health Maintenance  Topic Date Due  . TETANUS/TDAP  Never done  . PNA vac Low Risk  Adult (2 of 2 - PPSV23) 08/05/2017  . OPHTHALMOLOGY EXAM  08/22/2020  . INFLUENZA VACCINE  09/30/2020  . HEMOGLOBIN A1C  10/20/2020  . FOOT EXAM  04/22/2021  . MAMMOGRAM  05/27/2021  . COLONOSCOPY (Pts 45-22yr Insurance coverage will need to be confirmed)  06/24/2021  . DEXA SCAN  Completed  . COVID-19 Vaccine  Completed  . HPV VACCINES  Aged Out    Health Maintenance  Health Maintenance Due  Topic Date Due  . TETANUS/TDAP  Never done  . PNA vac Low Risk Adult (2 of 2 - PPSV23) 08/05/2017    Colorectal cancer  screening: Type of screening: Colonoscopy. Completed 06/2011. Repeat every 10 years  Mammogram status: Completed 05/27/20. Repeat every year  Bone Density status: Completed 2018. Results reflect: Bone density results: NORMAL.   Lung Cancer Screening: (Low Dose CT Chest recommended if Age 108-80 years, 30 pack-year currently smoking OR have quit w/in 15years.) does not qualify.    Additional Screening:  Vision Screening: Recommended annual ophthalmology exams for early detection of glaucoma and other disorders of the eye. Is the patient up to date with their annual eye exam?  Yes   Dental Screening: Recommended annual dental exams for proper oral hygiene     Plan:     1- Patient advised to get Tetanus and PNA Vaccine at the local pharmacy 2- Family history of breast cancer (mother and sister) - patient is up to date on Mammogram, continue yearly mammograms and self breast checks 3- Continue healthy diet and exercise  I have personally reviewed and noted the following in the patient's chart:   . Medical and social history . Use of alcohol, tobacco or illicit drugs  . Current medications and supplements . Functional ability and status . Nutritional status . Physical activity . Advanced directives . List of other physicians . Hospitalizations, surgeries, and ER visits in previous 12 months . Vitals . Screenings to include cognitive, depression, and  falls . Referrals and appointments  In addition, I have reviewed and discussed with patient certain preventive protocols, quality metrics, and best practice recommendations. A written personalized care plan for preventive services as well as general preventive health recommendations were provided to patient.     Erie Noe, LPN   08/01/8483

## 2020-06-04 NOTE — Patient Instructions (Addendum)
 Fall Prevention in the Home, Adult Falls can cause injuries and can happen to people of all ages. There are many things you can do to make your home safe and to help prevent falls. Ask for help when making these changes. What actions can I take to prevent falls? General Instructions  Use good lighting in all rooms. Replace any light bulbs that burn out.  Turn on the lights in dark areas. Use night-lights.  Keep items that you use often in easy-to-reach places. Lower the shelves around your home if needed.  Set up your furniture so you have a clear path. Avoid moving your furniture around.  Do not have throw rugs or other things on the floor that can make you trip.  Avoid walking on wet floors.  If any of your floors are uneven, fix them.  Add color or contrast paint or tape to clearly mark and help you see: ? Grab bars or handrails. ? First and last steps of staircases. ? Where the edge of each step is.  If you use a stepladder: ? Make sure that it is fully opened. Do not climb a closed stepladder. ? Make sure the sides of the stepladder are locked in place. ? Ask someone to hold the stepladder while you use it.  Know where your pets are when moving through your home. What can I do in the bathroom?  Keep the floor dry. Clean up any water on the floor right away.  Remove soap buildup in the tub or shower.  Use nonskid mats or decals on the floor of the tub or shower.  Attach bath mats securely with double-sided, nonslip rug tape.  If you need to sit down in the shower, use a plastic, nonslip stool.  Install grab bars by the toilet and in the tub and shower. Do not use towel bars as grab bars.      What can I do in the bedroom?  Make sure that you have a light by your bed that is easy to reach.  Do not use any sheets or blankets for your bed that hang to the floor.  Have a firm chair with side arms that you can use for support when you get dressed. What can I do  in the kitchen?  Clean up any spills right away.  If you need to reach something above you, use a step stool with a grab bar.  Keep electrical cords out of the way.  Do not use floor polish or wax that makes floors slippery. What can I do with my stairs?  Do not leave any items on the stairs.  Make sure that you have a light switch at the top and the bottom of the stairs.  Make sure that there are handrails on both sides of the stairs. Fix handrails that are broken or loose.  Install nonslip stair treads on all your stairs.  Avoid having throw rugs at the top or bottom of the stairs.  Choose a carpet that does not hide the edge of the steps on the stairs.  Check carpeting to make sure that it is firmly attached to the stairs. Fix carpet that is loose or worn. What can I do on the outside of my home?  Use bright outdoor lighting.  Fix the edges of walkways and driveways and fix any cracks.  Remove anything that might make you trip as you walk through a door, such as a raised step or threshold.  Trim   any bushes or trees on paths to your home.  Check to see if handrails are loose or broken and that both sides of all steps have handrails.  Install guardrails along the edges of any raised decks and porches.  Clear paths of anything that can make you trip, such as tools or rocks.  Have leaves, snow, or ice cleared regularly.  Use sand or salt on paths during winter.  Clean up any spills in your garage right away. This includes grease or oil spills. What other actions can I take?  Wear shoes that: ? Have a low heel. Do not wear high heels. ? Have rubber bottoms. ? Feel good on your feet and fit well. ? Are closed at the toe. Do not wear open-toe sandals.  Use tools that help you move around if needed. These include: ? Canes. ? Walkers. ? Scooters. ? Crutches.  Review your medicines with your doctor. Some medicines can make you feel dizzy. This can increase your  chance of falling. Ask your doctor what else you can do to help prevent falls. Where to find more information  Centers for Disease Control and Prevention, STEADI: www.cdc.gov  National Institute on Aging: www.nia.nih.gov Contact a doctor if:  You are afraid of falling at home.  You feel weak, drowsy, or dizzy at home.  You fall at home. Summary  There are many simple things that you can do to make your home safe and to help prevent falls.  Ways to make your home safe include removing things that can make you trip and installing grab bars in the bathroom.  Ask for help when making these changes in your home. This information is not intended to replace advice given to you by your health care provider. Make sure you discuss any questions you have with your health care provider. Document Revised: 09/20/2019 Document Reviewed: 09/20/2019 Elsevier Patient Education  2021 Elsevier Inc.   Health Maintenance, Female Adopting a healthy lifestyle and getting preventive care are important in promoting health and wellness. Ask your health care provider about:  The right schedule for you to have regular tests and exams.  Things you can do on your own to prevent diseases and keep yourself healthy. What should I know about diet, weight, and exercise? Eat a healthy diet  Eat a diet that includes plenty of vegetables, fruits, low-fat dairy products, and lean protein.  Do not eat a lot of foods that are high in solid fats, added sugars, or sodium.   Maintain a healthy weight Body mass index (BMI) is used to identify weight problems. It estimates body fat based on height and weight. Your health care provider can help determine your BMI and help you achieve or maintain a healthy weight. Get regular exercise Get regular exercise. This is one of the most important things you can do for your health. Most adults should:  Exercise for at least 150 minutes each week. The exercise should increase  your heart rate and make you sweat (moderate-intensity exercise).  Do strengthening exercises at least twice a week. This is in addition to the moderate-intensity exercise.  Spend less time sitting. Even light physical activity can be beneficial. Watch cholesterol and blood lipids Have your blood tested for lipids and cholesterol at 69 years of age, then have this test every 5 years. Have your cholesterol levels checked more often if:  Your lipid or cholesterol levels are high.  You are older than 69 years of age.  You are at   high risk for heart disease. What should I know about cancer screening? Depending on your health history and family history, you may need to have cancer screening at various ages. This may include screening for:  Breast cancer.  Cervical cancer.  Colorectal cancer.  Skin cancer.  Lung cancer. What should I know about heart disease, diabetes, and high blood pressure? Blood pressure and heart disease  High blood pressure causes heart disease and increases the risk of stroke. This is more likely to develop in people who have high blood pressure readings, are of African descent, or are overweight.  Have your blood pressure checked: ? Every 3-5 years if you are 18-39 years of age. ? Every year if you are 40 years old or older. Diabetes Have regular diabetes screenings. This checks your fasting blood sugar level. Have the screening done:  Once every three years after age 40 if you are at a normal weight and have a low risk for diabetes.  More often and at a younger age if you are overweight or have a high risk for diabetes. What should I know about preventing infection? Hepatitis B If you have a higher risk for hepatitis B, you should be screened for this virus. Talk with your health care provider to find out if you are at risk for hepatitis B infection. Hepatitis C Testing is recommended for:  Everyone born from 1945 through 1965.  Anyone with known  risk factors for hepatitis C. Sexually transmitted infections (STIs)  Get screened for STIs, including gonorrhea and chlamydia, if: ? You are sexually active and are younger than 69 years of age. ? You are older than 69 years of age and your health care provider tells you that you are at risk for this type of infection. ? Your sexual activity has changed since you were last screened, and you are at increased risk for chlamydia or gonorrhea. Ask your health care provider if you are at risk.  Ask your health care provider about whether you are at high risk for HIV. Your health care provider may recommend a prescription medicine to help prevent HIV infection. If you choose to take medicine to prevent HIV, you should first get tested for HIV. You should then be tested every 3 months for as long as you are taking the medicine. Pregnancy  If you are about to stop having your period (premenopausal) and you may become pregnant, seek counseling before you get pregnant.  Take 400 to 800 micrograms (mcg) of folic acid every day if you become pregnant.  Ask for birth control (contraception) if you want to prevent pregnancy. Osteoporosis and menopause Osteoporosis is a disease in which the bones lose minerals and strength with aging. This can result in bone fractures. If you are 65 years old or older, or if you are at risk for osteoporosis and fractures, ask your health care provider if you should:  Be screened for bone loss.  Take a calcium or vitamin D supplement to lower your risk of fractures.  Be given hormone replacement therapy (HRT) to treat symptoms of menopause. Follow these instructions at home: Lifestyle  Do not use any products that contain nicotine or tobacco, such as cigarettes, e-cigarettes, and chewing tobacco. If you need help quitting, ask your health care provider.  Do not use street drugs.  Do not share needles.  Ask your health care provider for help if you need support or  information about quitting drugs. Alcohol use  Do not drink alcohol   if: ? Your health care provider tells you not to drink. ? You are pregnant, may be pregnant, or are planning to become pregnant.  If you drink alcohol: ? Limit how much you use to 0-1 drink a day. ? Limit intake if you are breastfeeding.  Be aware of how much alcohol is in your drink. In the [place] one drink equals one 12 oz bottle of beer (355 mL), one 5 oz glass of wine (148 mL), or one 1 oz glass of hard liquor (44 mL). General instructions  Schedule regular health, dental, and eye exams.  Stay current with your vaccines.  Tell your health care provider if: ? You often feel depressed. ? You have ever been abused or do not feel safe at home. Summary  Adopting a healthy lifestyle and getting preventive care are important in promoting health and wellness.  Follow your health care provider's instructions about healthy diet, exercising, and getting tested or screened for diseases.  Follow your health care provider's instructions on monitoring your cholesterol and blood pressure. This information is not intended to replace advice given to you by your health care provider. Make sure you discuss any questions you have with your health care provider. Document Revised: 02/09/2018 Document Reviewed: 02/09/2018 Elsevier Patient Education  2021 Elsevier Inc.  

## 2020-06-10 ENCOUNTER — Other Ambulatory Visit: Payer: Self-pay | Admitting: Family Medicine

## 2020-06-11 ENCOUNTER — Telehealth (INDEPENDENT_AMBULATORY_CARE_PROVIDER_SITE_OTHER): Payer: Medicare Other | Admitting: Nurse Practitioner

## 2020-06-11 ENCOUNTER — Encounter: Payer: Self-pay | Admitting: Nurse Practitioner

## 2020-06-11 VITALS — BP 156/74 | HR 65 | Wt 284.0 lb

## 2020-06-11 DIAGNOSIS — J018 Other acute sinusitis: Secondary | ICD-10-CM

## 2020-06-11 DIAGNOSIS — J301 Allergic rhinitis due to pollen: Secondary | ICD-10-CM

## 2020-06-11 MED ORDER — AZITHROMYCIN 250 MG PO TABS: ORAL_TABLET | ORAL | 0 refills | Status: DC

## 2020-06-11 MED ORDER — FLUTICASONE PROPIONATE 50 MCG/ACT NA SUSP: 2.0000 | Freq: Every day | NASAL | 6 refills | Status: DC

## 2020-06-11 MED ORDER — LORATADINE 10 MG PO TABS
10.0000 mg | ORAL_TABLET | Freq: Every day | ORAL | 1 refills | Status: DC
Start: 2020-06-11 — End: 2020-09-26

## 2020-06-11 NOTE — Progress Notes (Signed)
Virtual Visit via Telephone Note   This visit type was conducted due to national recommendations for restrictions regarding the COVID-19 Pandemic (e.g. social distancing) in an effort to limit this patient's exposure and mitigate transmission in our community.  Due to her co-morbid illnesses, this patient is at least at moderate risk for complications without adequate follow up.  This format is felt to be most appropriate for this patient at this time.  The patient did not have access to video technology/had technical difficulties with video requiring transitioning to audio format only (telephone).  All issues noted in this document were discussed and addressed.  No physical exam could be performed with this format.  Patient verbally consented to a telehealth visit.   Date:  06/11/2020   ID:  Whitney Eaton, DOB Apr 10, 1950, MRN [id number]  Patient Location: Home Provider Location: Office/Clinic  PCP:  Rochel Brome, MD   Evaluation Performed:  Established patient, acute telemedicine visit  Chief Complaint:  Headache  History of Present Illness:    Whitney Eaton is a 70 y.o. female with nasal congestion, bilateral ear pain, rhinorrhea, sore throat, and headache. Onset of symptoms was 2-days-ago. Treatment has included Ibuprofen OTC PRN. States she has had direct exposure to ill contacts. She tells me she has been babysitting her grandchildren that have similar symptoms. She has a history of seasonal allergic rhinitis. She is not currently taking antihistamines or topical nasal steroids.  The patient does have symptoms concerning for COVID-19 infection (fever, chills, cough, or new shortness of breath).    Past Medical History:  Diagnosis Date  . Diabetes ([place])   . Hyperlipemia   . Hypertension   . Psoriasis   . Thyroid disease     Past Surgical History:  Procedure Laterality Date  . ABDOMINAL HYSTERECTOMY    . CHOLECYSTECTOMY  2011  . HYSTERECTOMY ABDOMINAL WITH SALPINGECTOMY    .  OOPHORECTOMY  1980    Family History  Problem Relation Age of Onset  . Alzheimer's disease Mother   . Breast cancer Mother   . Arthritis Mother   . Diabetes Mother   . CAD Father   . Diabetes Father   . Breast cancer Sister   . Glaucoma Maternal Grandmother   . Heart disease Other     Social History   Socioeconomic History  . Marital status: Married    Spouse name: Not on file  . Number of children: 2  . Years of education: Not on file  . Highest education level: Not on file  Occupational History  . Occupation: homemaker  Tobacco Use  . Smoking status: Never Smoker  . Smokeless tobacco: Never Used  Vaping Use  . Vaping Use: Never used  Substance and Sexual Activity  . Alcohol use: Never  . Drug use: Never  . Sexual activity: Not on file  Other Topics Concern  . Not on file  Social History Narrative  . Not on file   Social Determinants of Health   Financial Resource Strain: Low Risk   . Difficulty of Paying Living Expenses: Not hard at all  Food Insecurity: No Food Insecurity  . Worried About Charity fundraiser in the Last Year: Never true  . Ran Out of Food in the Last Year: Never true  Transportation Needs: No Transportation Needs  . Lack of Transportation (Medical): No  . Lack of Transportation (Non-Medical): No  Physical Activity: Insufficiently Active  . Days of Exercise per Week: 4 days  . Minutes of  Exercise per Session: 20 min  Stress: No Stress Concern Present  . Feeling of Stress : Only a little  Social Connections: Socially Integrated  . Frequency of Communication with Friends and Family: More than three times a week  . Frequency of Social Gatherings with Friends and Family: More than three times a week  . Attends Religious Services: More than 4 times per year  . Active Member of Clubs or Organizations: Yes  . Attends Archivist Meetings: More than 4 times per year  . Marital Status: Married  Human resources officer Violence: Not At Risk  .  Fear of Current or Ex-Partner: No  . Emotionally Abused: No  . Physically Abused: No  . Sexually Abused: No    Outpatient Medications Prior to Visit  Medication Sig Dispense Refill  . ACCU-CHEK GUIDE test strip USE TO CHECK FASTING BLOOD SUGAR AS DIRECTED 50 strip 4  . Accu-Chek Softclix Lancets lancets CHECK FASTING BLOOD SUGAR ONCE DAILY 100 each 3  . atenolol (TENORMIN) 25 MG tablet TAKE 1 TABLET(25 MG) BY MOUTH DAILY 90 tablet 1  . blood glucose meter kit and supplies KIT Check sugars fasting. E11.69 (Patient taking differently: Check sugars fasting. E11.69 morning and evening) 1 each 0  . estradiol (ESTRACE) 0.1 MG/GM vaginal cream SMARTSIG:Gram(s) Vaginal 3 Times a Week    . Ixekizumab (TALTZ) 80 MG/ML SOAJ Inject into the skin. Every 2 weeks. Done by Payton Mccallum.    . levothyroxine (SYNTHROID) 150 MCG tablet Take 1 tablet (150 mcg total) by mouth daily. 90 tablet 1  . meloxicam (MOBIC) 15 MG tablet TAKE 1 TABLET(15 MG) BY MOUTH DAILY 90 tablet 1  . metFORMIN (GLUCOPHAGE) 1000 MG tablet TAKE 1 TABLET(1000 MG) BY MOUTH TWICE DAILY WITH A MEAL 180 tablet 0  . Multiple Vitamin (MULTI-VITAMIN) tablet Take by mouth.    . Omega-3 1000 MG CAPS Take by mouth.    . tamsulosin (FLOMAX) 0.4 MG CAPS capsule Take 0.4 mg by mouth 2 (two) times daily.    . traMADol (ULTRAM) 50 MG tablet Take 1 tablet (50 mg total) by mouth every 8 (eight) hours as needed. 30 tablet 2  . valsartan-hydrochlorothiazide (DIOVAN-HCT) 320-25 MG tablet TAKE 1 TABLET BY MOUTH DAILY 90 tablet 0  . Vitamin D, Ergocalciferol, (DRISDOL) 1.25 MG (50000 UNIT) CAPS capsule Take 1 capsule (50,000 Units total) by mouth every 7 (seven) days. 13 capsule 3  . zolpidem (AMBIEN) 10 MG tablet Take 1 tablet (10 mg total) by mouth at bedtime. 30 tablet 3   No facility-administered medications prior to visit.    Allergies:   Crestor [rosuvastatin], Fenofibrate, Jardiance [empagliflozin], and Lipitor [atorvastatin]   Social History   Tobacco  Use  . Smoking status: Never Smoker  . Smokeless tobacco: Never Used  Vaping Use  . Vaping Use: Never used  Substance Use Topics  . Alcohol use: Never  . Drug use: Never     Review of Systems  Constitutional: Positive for malaise/fatigue. Negative for chills and fever.  HENT: Positive for congestion, ear pain (bilateral R>L), sinus pain (pressure) and sore throat (related to sinus drainage).        Post-nasal-drip  Eyes: Negative for pain.  Respiratory: Positive for cough (non-productive). Negative for sputum production and shortness of breath.   Cardiovascular: Negative for chest pain and orthopnea.  Gastrointestinal: Negative for abdominal pain, constipation, diarrhea, heartburn, nausea and vomiting.  Skin: Negative for rash.  Neurological: Positive for headaches. Negative for dizziness.  Endo/Heme/Allergies: Negative for  environmental allergies.     Labs/Other Tests and Data Reviewed:    Recent Labs: 01/17/2020: TSH 3.510 04/22/2020: ALT 24; BUN 20; Creatinine, Ser 0.83; Hemoglobin 13.1; Platelets 206; Potassium 3.9; Sodium 138   Recent Lipid Panel Lab Results  Component Value Date/Time   CHOL 183 04/22/2020 12:00 PM   TRIG 166 (H) 04/22/2020 12:00 PM   HDL 43 04/22/2020 12:00 PM   CHOLHDL 4.3 04/22/2020 12:00 PM   LDLCALC 111 (H) 04/22/2020 12:00 PM    Wt Readings from Last 3 Encounters:  06/11/20 284 lb (128.8 kg)  06/04/20 293 lb (132.9 kg)  05/14/20 (!) 300 lb 12.8 oz (136.4 kg)     Objective:    Vital Signs:  BP (!) 156/74   Pulse 65   Wt 284 lb (128.8 kg)   BMI 43.18 kg/m    Physical Exam No physical exam due to telemedicine visit  ASSESSMENT & PLAN:    1. Acute non-recurrent sinusitis of other sinus - azithromycin (ZITHROMAX) 250 MG tablet; Take two tablets by mouth on day one, take one tablet by mouth days two-five  Dispense: 6 tablet; Refill: 0  2. Seasonal allergic rhinitis due to pollen - loratadine (CLARITIN) 10 MG tablet; Take 1 tablet (10  mg total) by mouth daily.  Dispense: 90 tablet; Refill: 1 - fluticasone (FLONASE) 50 MCG/ACT nasal spray; Place 2 sprays into both nostrils daily.  Dispense: 16 g; Refill: 6  Rest and push fluids Continue Ibuprofen as needed for headache Notify office if symptoms fail to improve or worsen  COVID-19 Education: The signs and symptoms of COVID-19 were discussed with the patient and how to seek care for testing (follow up with PCP or arrange E-visit). The importance of social distancing was discussed today.   I spent 8 minutes dedicated to the care of this patient on the date of this encounter to include telphone time with the patient, as well as: EMR review and prescription medication management.  Follow Up:  In Person prn  Signed,  Rip Harbour, NP  06/11/2020 9:03 AM    [place]

## 2020-06-15 DIAGNOSIS — J0100 Acute maxillary sinusitis, unspecified: Secondary | ICD-10-CM | POA: Diagnosis not present

## 2020-06-18 ENCOUNTER — Ambulatory Visit (INDEPENDENT_AMBULATORY_CARE_PROVIDER_SITE_OTHER): Payer: Medicare Other | Admitting: Family Medicine

## 2020-06-18 ENCOUNTER — Other Ambulatory Visit: Payer: Self-pay

## 2020-06-18 ENCOUNTER — Encounter: Payer: Self-pay | Admitting: Family Medicine

## 2020-06-18 VITALS — BP 150/86 | HR 64 | Temp 96.6°F | Resp 20 | Ht 68.0 in | Wt 289.0 lb

## 2020-06-18 DIAGNOSIS — R0602 Shortness of breath: Secondary | ICD-10-CM

## 2020-06-18 DIAGNOSIS — J4541 Moderate persistent asthma with (acute) exacerbation: Secondary | ICD-10-CM | POA: Insufficient documentation

## 2020-06-18 DIAGNOSIS — R0902 Hypoxemia: Secondary | ICD-10-CM | POA: Diagnosis not present

## 2020-06-18 DIAGNOSIS — J189 Pneumonia, unspecified organism: Secondary | ICD-10-CM | POA: Diagnosis not present

## 2020-06-18 DIAGNOSIS — R062 Wheezing: Secondary | ICD-10-CM | POA: Diagnosis not present

## 2020-06-18 LAB — POC COVID19 BINAXNOW: SARS Coronavirus 2 Ag: NEGATIVE

## 2020-06-18 MED ORDER — IPRATROPIUM-ALBUTEROL 0.5-2.5 (3) MG/3ML IN SOLN
3.0000 mL | Freq: Once | RESPIRATORY_TRACT | Status: AC
  Administered 2020-06-18: 3 mL via RESPIRATORY_TRACT

## 2020-06-18 MED ORDER — CEFTRIAXONE SODIUM 1 G IJ SOLR
1.0000 g | Freq: Once | INTRAMUSCULAR | Status: AC
  Administered 2020-06-18: 1 g via INTRAMUSCULAR

## 2020-06-18 MED ORDER — TRIAMCINOLONE ACETONIDE 40 MG/ML IJ SUSP
80.0000 mg | Freq: Once | INTRAMUSCULAR | Status: AC
  Administered 2020-06-18: 80 mg via INTRAMUSCULAR

## 2020-06-18 MED ORDER — PREDNISONE 10 MG PO TABS
ORAL_TABLET | ORAL | 0 refills | Status: AC
Start: 2020-06-18 — End: 2020-06-24

## 2020-06-18 MED ORDER — ALBUTEROL SULFATE HFA 108 (90 BASE) MCG/ACT IN AERS: 2.0000 | INHALATION_SPRAY | Freq: Four times a day (QID) | RESPIRATORY_TRACT | 0 refills | Status: DC | PRN

## 2020-06-18 MED ORDER — BREZTRI AEROSPHERE 160-9-4.8 MCG/ACT IN AERO: 2.0000 | INHALATION_SPRAY | Freq: Two times a day (BID) | RESPIRATORY_TRACT | 0 refills | Status: DC

## 2020-06-18 NOTE — Progress Notes (Signed)
Acute Office Visit  Subjective:    Patient ID: Whitney Eaton, female    DOB: 05-11-1950, 70 y.o.   MRN: [id number]  Chief Complaint  Patient presents with  . Cough    HPI Patient is in today for shortness of breath.  The patient was seen by [name], nurse practitioner on April 12 and treated with Zithromax, Flonase and Claritin.  The patient worsened over the weekend and went to the urgent care where she was started on Augmentin.  She found an old inhaler [employer]) and has been doing that once daily for the last few days.  In addition she has a rescue inhaler which she has been using.  Patient reports increasing shortness of breath with decreasing her oxygen level at home to approximately 90.  She complains of a right earache, stuffy nose.  Denies sore throat.  Her husband was diagnosed with strep throat last week.  She has been around her grandchildren who all have been sick.  She also is complaining of sinus headaches.  Past Medical History:  Diagnosis Date  . Diabetes ([place])   . Hyperlipemia   . Hypertension   . Psoriasis   . Thyroid disease     Past Surgical History:  Procedure Laterality Date  . ABDOMINAL HYSTERECTOMY    . CHOLECYSTECTOMY  2011  . HYSTERECTOMY ABDOMINAL WITH SALPINGECTOMY    . OOPHORECTOMY  1980    Family History  Problem Relation Age of Onset  . Alzheimer's disease Mother   . Breast cancer Mother   . Arthritis Mother   . Diabetes Mother   . CAD Father   . Diabetes Father   . Breast cancer Sister   . Glaucoma Maternal Grandmother   . Heart disease Other     Social History   Socioeconomic History  . Marital status: Married    Spouse name: Not on file  . Number of children: 2  . Years of education: Not on file  . Highest education level: Not on file  Occupational History  . Occupation: homemaker  Tobacco Use  . Smoking status: Never Smoker  . Smokeless tobacco: Never Used  Vaping Use  . Vaping Use: Never used  Substance and Sexual  Activity  . Alcohol use: Never  . Drug use: Never  . Sexual activity: Not on file  Other Topics Concern  . Not on file  Social History Narrative  . Not on file   Social Determinants of Health   Financial Resource Strain: Low Risk   . Difficulty of Paying Living Expenses: Not hard at all  Food Insecurity: No Food Insecurity  . Worried About Charity fundraiser in the Last Year: Never true  . Ran Out of Food in the Last Year: Never true  Transportation Needs: No Transportation Needs  . Lack of Transportation (Medical): No  . Lack of Transportation (Non-Medical): No  Physical Activity: Insufficiently Active  . Days of Exercise per Week: 4 days  . Minutes of Exercise per Session: 20 min  Stress: No Stress Concern Present  . Feeling of Stress : Only a little  Social Connections: Socially Integrated  . Frequency of Communication with Friends and Family: More than three times a week  . Frequency of Social Gatherings with Friends and Family: More than three times a week  . Attends Religious Services: More than 4 times per year  . Active Member of Clubs or Organizations: Yes  . Attends Archivist Meetings: More than 4 times  per year  . Marital Status: Married  Human resources officer Violence: Not At Risk  . Fear of Current or Ex-Partner: No  . Emotionally Abused: No  . Physically Abused: No  . Sexually Abused: No    Outpatient Medications Prior to Visit  Medication Sig Dispense Refill  . ACCU-CHEK GUIDE test strip USE TO CHECK FASTING BLOOD SUGAR AS DIRECTED 50 strip 4  . Accu-Chek Softclix Lancets lancets CHECK FASTING BLOOD SUGAR ONCE DAILY 100 each 3  . atenolol (TENORMIN) 25 MG tablet TAKE 1 TABLET(25 MG) BY MOUTH DAILY 90 tablet 1  . blood glucose meter kit and supplies KIT Check sugars fasting. E11.69 (Patient taking differently: Check sugars fasting. E11.69 morning and evening) 1 each 0  . estradiol (ESTRACE) 0.1 MG/GM vaginal cream SMARTSIG:Gram(s) Vaginal 3 Times a  Week    . fluticasone (FLONASE) 50 MCG/ACT nasal spray Place 2 sprays into both nostrils daily. 16 g 6  . Ixekizumab (TALTZ) 80 MG/ML SOAJ Inject into the skin. Every 2 weeks. Done by Payton Mccallum.    . levothyroxine (SYNTHROID) 150 MCG tablet Take 1 tablet (150 mcg total) by mouth daily. 90 tablet 1  . loratadine (CLARITIN) 10 MG tablet Take 1 tablet (10 mg total) by mouth daily. 90 tablet 1  . meloxicam (MOBIC) 15 MG tablet TAKE 1 TABLET(15 MG) BY MOUTH DAILY 90 tablet 1  . metFORMIN (GLUCOPHAGE) 1000 MG tablet TAKE 1 TABLET(1000 MG) BY MOUTH TWICE DAILY WITH A MEAL 180 tablet 0  . Multiple Vitamin (MULTI-VITAMIN) tablet Take by mouth.    . Omega-3 1000 MG CAPS Take by mouth.    . tamsulosin (FLOMAX) 0.4 MG CAPS capsule Take 0.4 mg by mouth 2 (two) times daily.    . traMADol (ULTRAM) 50 MG tablet Take 1 tablet (50 mg total) by mouth every 8 (eight) hours as needed. 30 tablet 2  . valsartan-hydrochlorothiazide (DIOVAN-HCT) 320-25 MG tablet TAKE 1 TABLET BY MOUTH DAILY 90 tablet 0  . Vitamin D, Ergocalciferol, (DRISDOL) 1.25 MG (50000 UNIT) CAPS capsule Take 1 capsule (50,000 Units total) by mouth every 7 (seven) days. 13 capsule 3  . zolpidem (AMBIEN) 10 MG tablet Take 1 tablet (10 mg total) by mouth at bedtime. 30 tablet 3  . azithromycin (ZITHROMAX) 250 MG tablet Take two tablets by mouth on day one, take one tablet by mouth days two-five 6 tablet 0   No facility-administered medications prior to visit.    Allergies  Allergen Reactions  . Crestor [Rosuvastatin]     myalgia  . Fenofibrate     myalgia  . Jardiance [Empagliflozin]     Nausea.  . Lipitor [Atorvastatin]     myalgia    Review of Systems  Constitutional: Negative for chills, fatigue and fever.  HENT: Positive for congestion, ear pain and rhinorrhea. Negative for sore throat.   Respiratory: Positive for cough and shortness of breath.   Cardiovascular: Negative for chest pain.  Neurological: Positive for headaches.        Objective:    Physical Exam Vitals reviewed.  Constitutional:      Appearance: Normal appearance. She is obese.  HENT:     Right Ear: Tympanic membrane, ear canal and external ear normal.     Left Ear: Tympanic membrane, ear canal and external ear normal.     Nose: Congestion present.     Comments: Frontal sinus tenderness    Mouth/Throat:     Pharynx: Oropharynx is clear.  Neck:     Vascular: No  carotid bruit.  Cardiovascular:     Rate and Rhythm: Normal rate and regular rhythm.     Heart sounds: Normal heart sounds. No murmur heard.   Pulmonary:     Effort: Respiratory distress (mild with ambulation) present.     Breath sounds: Wheezing and rhonchi present.  Neurological:     Mental Status: She is alert and oriented to person, place, and time.  Psychiatric:        Mood and Affect: Mood normal.        Behavior: Behavior normal.     BP (!) 150/86   Pulse 64   Temp (!) 96.6 F (35.9 C)   Resp 20   Ht $R'5\' 8"'ty$  (1.727 m)   Wt 289 lb (131.1 kg)   BMI 43.94 kg/m  Wt Readings from Last 3 Encounters:  06/18/20 289 lb (131.1 kg)  06/11/20 284 lb (128.8 kg)  06/04/20 293 lb (132.9 kg)    Health Maintenance Due  Topic Date Due  . Hepatitis C Screening  Never done  . TETANUS/TDAP  Never done  . PNA vac Low Risk Adult (2 of 2 - PPSV23) 08/05/2017    There are no preventive care reminders to display for this patient.   Lab Results  Component Value Date   TSH 3.510 01/17/2020   Lab Results  Component Value Date   WBC 7.5 04/22/2020   HGB 13.1 04/22/2020   HCT 39.9 04/22/2020   MCV 95 04/22/2020   PLT 206 04/22/2020   Lab Results  Component Value Date   NA 138 04/22/2020   K 3.9 04/22/2020   CO2 24 04/22/2020   GLUCOSE 97 04/22/2020   BUN 20 04/22/2020   CREATININE 0.83 04/22/2020   BILITOT 0.6 04/22/2020   ALKPHOS 86 04/22/2020   AST 23 04/22/2020   ALT 24 04/22/2020   PROT 7.2 04/22/2020   ALBUMIN 4.2 04/22/2020   CALCIUM 9.7 04/22/2020   Lab  Results  Component Value Date   CHOL 183 04/22/2020   Lab Results  Component Value Date   HDL 43 04/22/2020   Lab Results  Component Value Date   LDLCALC 111 (H) 04/22/2020   Lab Results  Component Value Date   TRIG 166 (H) 04/22/2020   Lab Results  Component Value Date   CHOLHDL 4.3 04/22/2020   Lab Results  Component Value Date   HGBA1C 6.5 (H) 04/22/2020       Assessment & Plan:  1. Community acquired pneumonia, unspecified laterality - cefTRIAXone (ROCEPHIN) injection 1 g  2. Moderate persistent asthma with (acute) exacerbation - predniSONE (DELTASONE) 10 MG tablet; Take 6 tablets (60 mg total) by mouth daily with breakfast for 1 day, THEN 5 tablets (50 mg total) daily with breakfast for 1 day, THEN 4 tablets (40 mg total) daily with breakfast for 1 day, THEN 3 tablets (30 mg total) daily with breakfast for 1 day, THEN 2 tablets (20 mg total) daily with breakfast for 1 day, THEN 1 tablet (10 mg total) daily with breakfast for 1 day.  Dispense: 21 tablet; Refill: 0 - triamcinolone acetonide (KENALOG-40) injection 80 mg  3. Shortness of breath - DG Chest 2 View  PLAN:  Patient was given Rocephin and Kenalog shot in the office. Zpack in system.  Start on [place] 2 puffs twice daily.  Sample given. Albuterol inhaler 2 puffs 4 times a day for 48 hours and then may decrease to 2 puffs 4 times a day as needed for shortness of  breath/wheezing. Prednisone pack given.  Recommend take with meals. Recommend go to [facility] for chest x-ray after leaving our office.   Meds ordered this encounter  Medications  . predniSONE (DELTASONE) 10 MG tablet    Sig: Take 6 tablets (60 mg total) by mouth daily with breakfast for 1 day, THEN 5 tablets (50 mg total) daily with breakfast for 1 day, THEN 4 tablets (40 mg total) daily with breakfast for 1 day, THEN 3 tablets (30 mg total) daily with breakfast for 1 day, THEN 2 tablets (20 mg total) daily with breakfast for 1 day, THEN 1  tablet (10 mg total) daily with breakfast for 1 day.    Dispense:  21 tablet    Refill:  0  . cefTRIAXone (ROCEPHIN) injection 1 g  . triamcinolone acetonide (KENALOG-40) injection 80 mg  . Budeson-Glycopyrrol-Formoterol (BREZTRI AEROSPHERE) 160-9-4.8 MCG/ACT AERO    Sig: Inhale 2 puffs into the lungs in the morning and at bedtime.    Dispense:  10.7 g    Refill:  0  . albuterol (VENTOLIN HFA) 108 (90 Base) MCG/ACT inhaler    Sig: Inhale 2 puffs into the lungs every 6 (six) hours as needed for wheezing or shortness of breath.    Dispense:  8 g    Refill:  0    Orders Placed This Encounter  Procedures  . DG Chest 2 View    Follow-up: Return in about 1 week (around 06/25/2020).  An After Visit Summary was printed and given to the patient.  Rochel Brome, MD [facility] [phone]

## 2020-06-18 NOTE — Patient Instructions (Signed)
Patient was given Rocephin and Kenalog shot in the office. Start on [place] 2 puffs twice daily.  Sample given. Albuterol inhaler 2 puffs 4 times a day for 48 hours and then may decrease to 2 puffs 4 times a day as needed for shortness of breath/wheezing. Prednisone pack given.  Recommend take with meals. Complete Augmentin. Recommend go to [facility] for chest x-ray after leaving our office.

## 2020-06-21 ENCOUNTER — Other Ambulatory Visit: Payer: Self-pay

## 2020-06-21 MED ORDER — METFORMIN HCL 1000 MG PO TABS: ORAL_TABLET | ORAL | 0 refills | Status: DC

## 2020-06-23 ENCOUNTER — Encounter: Payer: Self-pay | Admitting: Family Medicine

## 2020-06-24 ENCOUNTER — Telehealth: Payer: Self-pay

## 2020-06-24 NOTE — Progress Notes (Signed)
Office Visit Note  Patient: Whitney Eaton             Date of Birth: 1950/10/14           MRN: [id number]             PCP: Rochel Brome, MD Referring: Rochel Brome, MD Visit Date: 06/25/2020  Subjective:  New Patient (Initial Visit) (Patient complains of bilateral hand and bilateral foot pain and stiffness. Patient finished Prednisone 2 days ago and noticed great improvement with symptoms. Patient is on Taltz, managed by dermatologist. )   History of Present Illness: Whitney Eaton is a 70 y.o. female here for evaluation of psoriatic arthritis. She was previously a patient at [facility] and records were reviewed. Labs showing elevated CRP, negative RF and CCP Abs. Imaging was without erosive changes.  She is not actively following up with rheumatology but is seeing dermatology for the skin psoriasis currently on treatment with Taltz.  More recently she was ill with pneumonia treated with antibiotics but was also on a significant dose steroid taper on account of her moderate asthma exacerbated by the pulmonary symptoms.  While on the steroids she has been feeling great with no joint complaints.  However before this she was typically experiencing some bilateral hand and foot pain and stiffness.  Her skin disease has been well controlled on the [place].  DMARD Hx MTX - skin abscess Humira - Loss of efficacy Stelara - Incomplete response Taltz - Currently on treatment   Activities of Daily Living:  Patient reports morning stiffness for 24 hours.   Patient Reports nocturnal pain.  Difficulty dressing/grooming: Denies Difficulty climbing stairs: Denies Difficulty getting out of chair: Denies Difficulty using hands for taps, buttons, cutlery, and/or writing: Denies  Review of Systems  Constitutional: Positive for fatigue.  HENT: Negative for mouth sores, mouth dryness and nose dryness.   Eyes: Negative for pain, itching, visual disturbance and dryness.  Respiratory: Positive for  cough, shortness of breath and difficulty breathing. Negative for hemoptysis.        Patient was recently diagnosed with pneumonia.   Cardiovascular: Positive for swelling in legs/feet. Negative for chest pain and palpitations.  Gastrointestinal: Negative for abdominal pain, blood in stool, constipation and diarrhea.  Endocrine: Negative for increased urination.  Genitourinary: Negative for painful urination.  Musculoskeletal: Positive for arthralgias, joint pain, joint swelling, morning stiffness and muscle tenderness. Negative for myalgias, muscle weakness and myalgias.  Skin: Negative for color change, rash and redness.  Allergic/Immunologic: Negative for susceptible to infections.  Neurological: Negative for dizziness, numbness, headaches, memory loss and weakness.  Hematological: Negative for swollen glands.  Psychiatric/Behavioral: Negative for confusion and sleep disturbance.    PMFS History:  Patient Active Problem List   Diagnosis Date Noted  . Moderate persistent asthma with (acute) exacerbation 06/18/2020  . Alzheimer's disease ([place]) 05/14/2020  . Left lumbar pain 05/14/2020  . Psoriatic arthritis ([place]) 01/17/2020  . 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 Primary insomnia 01/17/2020  . Other specified hypothyroidism 05/23/2019  . Vitamin D insufficiency 05/23/2019  . Benign hypertension 05/23/2019  . Diabetes mellitus type II, non insulin dependent ([place]) 05/23/2019  . Mixed hyperlipidemia 05/23/2019  . Borderline diabetes 02/07/2016  . Dyspnea on exertion 02/07/2016  . Morbid obesity ([place]) 02/07/2016  . Obstructive sleep apnea 02/07/2016    Past Medical History:  Diagnosis Date  . Diabetes ([place])   . Hyperlipemia   . Hypertension   . Psoriasis   . Sleep apnea   .  Thyroid disease     Family History  Problem Relation Age of Onset  . Alzheimer's disease Mother   . Breast cancer Mother   . Arthritis Mother   . Diabetes Mother   . CAD Father   . Diabetes Father   .  Parkinson's disease Father   . Breast cancer Sister   . Glaucoma Maternal Grandmother   . Heart disease Other   . Neurologic Disorder Brother    Past Surgical History:  Procedure Laterality Date  . ABDOMINAL HYSTERECTOMY    . CHOLECYSTECTOMY  2011  . HYSTERECTOMY ABDOMINAL WITH SALPINGECTOMY    . OOPHORECTOMY  1980   Social History   Social History Narrative  . Not on file   Immunization History  Administered Date(s) Administered  . Fluad Quad(high Dose 65+) 01/17/2020  . Influenza,inj,quad, With Preservative 12/19/2013, 10/29/2014, 10/31/2015  . Influenza-Unspecified 11/27/2011, 01/02/2013, 01/15/2017, 12/16/2017, 01/18/2019  . Moderna Sars-Covid-2 Vaccination 04/06/2019, 05/02/2019, 02/21/2020  . Pneumococcal Conjugate-13 08/05/2016  . Pneumococcal Polysaccharide-23 03/16/2012  . Zoster 01/21/2011     Objective: Vital Signs: BP (!) 143/80 (BP Location: Right Arm, Patient Position: Sitting, Cuff Size: Large)   Pulse (!) 51   Ht 5\' 8"  (1.727 m)   Wt 295 lb (133.8 kg)   BMI 44.85 kg/m    Physical Exam Constitutional:      Appearance: She is obese.  HENT:     Right Ear: External ear normal.     Left Ear: External ear normal.     Mouth/Throat:     Mouth: Mucous membranes are moist.     Pharynx: Oropharynx is clear.  Eyes:     Conjunctiva/sclera: Conjunctivae normal.  Cardiovascular:     Rate and Rhythm: Normal rate and regular rhythm.  Pulmonary:     Effort: Pulmonary effort is normal.     Breath sounds: Normal breath sounds.  Skin:    General: Skin is warm and dry.     Findings: No rash.  Neurological:     General: No focal deficit present.     Mental Status: She is alert.  Psychiatric:        Mood and Affect: Mood normal.    Musculoskeletal Exam:  Neck full ROM no tenderness Shoulders full ROM no tenderness or swelling Elbows full ROM no tenderness or swelling Wrists full ROM no tenderness or swelling Fingers full ROM no tenderness or swelling Knees  full ROM no tenderness or swelling Ankles full ROM no tenderness or swelling MTPs full ROM no tenderness or swelling  Investigation: No additional findings.  Imaging: No results found.  Recent Labs: Lab Results  Component Value Date   WBC 7.5 04/22/2020   HGB 13.1 04/22/2020   PLT 206 04/22/2020   NA 138 04/22/2020   K 3.9 04/22/2020   CL 98 04/22/2020   CO2 24 04/22/2020   GLUCOSE 97 04/22/2020   BUN 20 04/22/2020   CREATININE 0.83 04/22/2020   BILITOT 0.6 04/22/2020   ALKPHOS 86 04/22/2020   AST 23 04/22/2020   ALT 24 04/22/2020   PROT 7.2 04/22/2020   ALBUMIN 4.2 04/22/2020   CALCIUM 9.7 04/22/2020   GFRAA 83 04/22/2020    Speciality Comments: No specialty comments available.  Procedures:  No procedures performed Allergies: Crestor [rosuvastatin], Fenofibrate, Jardiance [empagliflozin], and Lipitor [atorvastatin]   Assessment / Plan:     Visit Diagnoses: Psoriatic arthritis ([place])  She has a history of psoriatic arthritis and skin psoriasis currently skin disease has been well controlled on  Taltz joint disease she had been experiencing pain in her hands and feet although this is all doing well since she has been on steroids for the recent pneumonia. Recommend she continue on current treatment we can follow-up in a month by that time she will be off steroids for an adequate period to reassess how good is her disease control.  Discussed that Donnetta Hail also has efficacy for joint inflammation so we will need to see whether she needs multiple DMARDs or if the residual pain is more osteoarthritis.  Orders: No orders of the defined types were placed in this encounter.  No orders of the defined types were placed in this encounter.   Follow-Up Instructions: Return in about 4 weeks (around 07/23/2020) for PsA new pt f/u on Taltz with dermatology.   Collier Salina, MD  Note - This record has been created using [employer].  Chart creation errors have been sought,  but may not always  have been located. Such creation errors do not reflect on  the standard of medical care.

## 2020-06-24 NOTE — Telephone Encounter (Signed)
-----   Message from Rochel Brome, MD sent at 06/23/2020  8:19 PM EDT ----- Regarding: Pneumonia Please call and check on patient. I would like her to come in to the office for a recheck on Monday.

## 2020-06-24 NOTE — Telephone Encounter (Signed)
Patient has follow-up appointment this week.  She is improving and feeling better.

## 2020-06-25 ENCOUNTER — Ambulatory Visit: Payer: Medicare Other | Admitting: Internal Medicine

## 2020-06-25 ENCOUNTER — Encounter: Payer: Self-pay | Admitting: Internal Medicine

## 2020-06-25 ENCOUNTER — Other Ambulatory Visit: Payer: Self-pay

## 2020-06-25 ENCOUNTER — Ambulatory Visit (INDEPENDENT_AMBULATORY_CARE_PROVIDER_SITE_OTHER): Payer: Medicare Other | Admitting: Family Medicine

## 2020-06-25 VITALS — BP 143/80 | HR 51 | Ht 68.0 in | Wt 295.0 lb

## 2020-06-25 VITALS — BP 150/80 | HR 64 | Temp 96.8°F | Resp 18 | Ht 68.0 in | Wt 286.0 lb

## 2020-06-25 DIAGNOSIS — J4521 Mild intermittent asthma with (acute) exacerbation: Secondary | ICD-10-CM

## 2020-06-25 DIAGNOSIS — L4050 Arthropathic psoriasis, unspecified: Secondary | ICD-10-CM | POA: Diagnosis not present

## 2020-06-25 DIAGNOSIS — J189 Pneumonia, unspecified organism: Secondary | ICD-10-CM

## 2020-06-25 MED ORDER — BREZTRI AEROSPHERE 160-9-4.8 MCG/ACT IN AERO: 2.0000 | INHALATION_SPRAY | Freq: Two times a day (BID) | RESPIRATORY_TRACT | 3 refills | Status: DC

## 2020-06-25 MED ORDER — MONTELUKAST SODIUM 10 MG PO TABS: 10.0000 mg | ORAL_TABLET | Freq: Every day | ORAL | 11 refills | Status: DC

## 2020-06-25 NOTE — Patient Instructions (Signed)
I would like to follow up in about 1 month to recheck symptoms and also check blood tests for inflammatory disease activity at that time. I believe your inflammation is reasonable well controlled at this time on Taltz and with recent steroid medications.   Psoriatic Arthritis Psoriatic arthritis is a long-term (chronic) condition that causes pain, swelling, and stiffness in the joints. The large joints of the legs, hips, and pelvis are most often affected. The joints in the neck and back may also be affected. Sometimes psoriatic arthritis can involve joints in the toes, fingers, wrists, and elbows. Most people with psoriatic arthritis have a chronic skin disease that causes itchy scales and patches to form on the skin (psoriasis) before they develop psoriatic arthritis. In some cases, a person may have psoriatic arthritis before or without having psoriasis. Psoriatic arthritis can be mild or severe. It may come and go or cause symptoms all the time. It may affect one joint, a few joints, or many joints. In severe cases, untreated psoriatic arthritis can cause joint damage. What are the causes? The exact cause of this condition is not known. Psoriatic arthritis is an autoimmune disease. With this type of disease, the body's defense system (immune system) mistakenly attacks healthy tissues. If you have psoriasis, the immune system attacks the skin. With psoriatic arthritis, the immune system attacks joints and the tissues that connect muscles to joints (tendons). The disease may be activated by a trigger, such as an infection or stress. What increases the risk? You are more likely to develop this condition if:  You have psoriasis. This is the biggest risk factor. Most people who develop psoriatic arthritis have had psoriasis for 5 to 10 years.  You have a family history of psoriasis or psoriatic arthritis.  You are between the ages of 29 and 2. What are the signs or symptoms? The main symptom of  this condition is inflammation of joints and tendons. Other symptoms may include:  Joint swelling.  Joint pain.  Joint stiffness, especially in the morning.  Swollen fingers and toes.  Pain in areas where tendons connect to bones.  Pain in the heel or sole of the foot.  Pitted and weak nails.  Tiredness (fatigue).  Eye redness.   How is this diagnosed? This condition may be diagnosed based on:  Your symptoms and medical history.  A physical exam.  X-rays to look for joint inflammation or damage, especially in the joints of the pelvis (sacroiliac joints).  Other imaging tests, such as a CT scan or MRI.  Blood tests to look for inflammation and to rule out other causes of joint inflammation, such as gout or rheumatoid arthritis. How is this treated? The goal of treatment is to reduce pain and inflammation and protect joints from damage. Treatment depends on how severe the inflammation is and how many joints are affected. Treatment may include:  Medicines, such as: ? NSAIDs to relieve pain and inflammation. For people with mild disease, these may be the only medicines needed. ? Disease-modifying antirheumatic drugs (DMARDs). ? Biologic medicines. These may be used for severe inflammation or if other medicines are not working. These medicines are usually given as injections or through an IV. They are very effective for many people with inflammatory arthritis, but there is an increased risk of infection.  Physical therapy and other exercise to strengthen muscles that support joints and to prevent joint stiffness.  A brace or splint to support a painful and swollen joint.  Surgery to  reconstruct or replace a joint. This may be an option for people with severe joint damage if other treatments have not helped. Follow these instructions at home: Medicines  Take over-the-counter and prescription medicines only as told by your health care provider.  Be aware of the possible side  effects of your medicine and know when to call your health care provider.  If you are taking a biologic, let your health care provider know if you have signs or symptoms of an infection. You may need to stop treatment until your infection clears up. Managing pain, stiffness, and swelling  If directed, put ice on painful areas. ? Put ice in a plastic bag. ? Place a towel between your skin and the bag. ? Leave the ice on for 20 minutes, 2-3 times a day.   If you have a brace or splint:  Wear the brace or splint as told by your health care provider. Remove it only as told by your health care provider.  Loosen the brace or splint if your fingers or toes tingle, become numb, or turn cold and blue.  Keep the brace or splint clean.  If the brace or splint is not waterproof: ? Do not let it get wet. ? Cover it with a watertight covering when you take a bath or shower. Activity  Return to your normal activities as told by your health care provider. Ask your health care provider what activities are safe for you.  Get regular exercise. Ask your health care provider what type of exercise is best for you. Your health care provider may recommend: ? Low-impact exercises such as walking, biking, or swimming. ? Exercises that include stretching, such as tai chi and yoga.  Do not exercise when you have a flare of symptoms. Rest until the symptoms improve. Eating and drinking  Do not drink alcohol if you are taking an NSAID. Alcohol and NSAIDs can cause stomach irritation.  Eat a healthy diet that includes plenty of vegetables, fruits, whole grains, low-fat dairy products, and lean protein. Do not eat a lot of foods that are high in solid fats, added sugars, or salt.   General instructions  Do not use any products that contain nicotine or tobacco, such as cigarettes, e-cigarettes, and chewing tobacco. These can make arthritis worse. If you need help quitting, ask your health care  provider.  Maintain a healthy weight. A healthy weight will help you stay active and take stress off your joints.  Stay up to date on all immunizations, including the yearly (annual) flu vaccine.  Keep all follow-up visits as told by your health care provider. This is important. Where to find more information  American Academy of Dermatology: http://jones-macias.info/  American College of Rheumatology: www.rheumatology.[place]: www.psoriasis.org Contact a health care provider if:  Your signs and symptoms flare up.  You have side effects from your medicines.  You are taking a biologic and you have a fever or other signs of infection, such as: ? Chills. ? Feeling tired. ? Cough or sore throat. ? Loss of appetite. Summary  Psoriatic arthritis is an autoimmune disease that causes joint pain, swelling, and stiffness.  Most people with psoriatic arthritis have the skin disease called psoriasis first.  You may have imaging tests and blood tests to help your health care provider diagnose this condition.  The goal of treatment is to reduce pain and inflammation and protect joints from damage.  Medicines can relieve symptoms and prevent joint damage.  Osteoarthritis  Osteoarthritis is a type of arthritis. It refers to joint pain or joint disease. Osteoarthritis affects tissue that covers the ends of bones in joints (cartilage). Cartilage acts as a cushion between the bones and helps them move smoothly. Osteoarthritis occurs when cartilage in the joints gets worn down. Osteoarthritis is sometimes called "wear and tear" arthritis. Osteoarthritis is the most common form of arthritis. It often occurs in older people. It is a condition that gets worse over time. The joints most often affected by this condition are in the fingers, toes, hips, knees, and spine, including the neck and lower back. What are the causes? This condition is caused by the wearing down of cartilage  that covers the ends of bones. What increases the risk? The following factors may make you more likely to develop this condition:  Being age 44 or older.  Obesity.  Overuse of joints.  Past injury of a joint.  Past surgery on a joint.  Family history of osteoarthritis. What are the signs or symptoms? The main symptoms of this condition are pain, swelling, and stiffness in the joint. Other symptoms may include:  An enlarged joint.  More pain and further damage caused by small pieces of bone or cartilage that break off and float inside of the joint.  Small deposits of bone (osteophytes) that grow on the edges of the joint.  A grating or scraping feeling inside the joint when you move it.  Popping or creaking sounds when you move.  Difficulty walking or exercising.  An inability to grip items, twist your hand(s), or control the movements of your hands and fingers. How is this diagnosed? This condition may be diagnosed based on:  Your medical history.  A physical exam.  Your symptoms.  X-rays of the affected joint(s).  Blood tests to rule out other types of arthritis. How is this treated? There is no cure for this condition, but treatment can help control pain and improve joint function. Treatment may include a combination of therapies, such as:  Pain relief techniques, such as: ? Applying heat and cold to the joint. ? Massage. ? A form of talk therapy called cognitive behavioral therapy (CBT). This therapy helps you set goals and follow up on the changes that you make.  Medicines for pain and inflammation. The medicines can be taken by mouth or applied to the skin. They include: ? NSAIDs, such as ibuprofen. ? Prescription medicines. ? Strong anti-inflammatory medicines (corticosteroids). ? Certain nutritional supplements.  A prescribed exercise program. You may work with a physical therapist.  Assistive devices, such as a brace, wrap, splint, specialized glove,  or cane.  A weight control plan.  Surgery, such as: ? An osteotomy. This is done to reposition the bones and relieve pain or to remove loose pieces of bone and cartilage. ? Joint replacement surgery. You may need this surgery if you have advanced osteoarthritis. Follow these instructions at home: Activity  Rest your affected joints as told by your health care provider.  Exercise as told by your health care provider. He or she may recommend specific types of exercise, such as: ? Strengthening exercises. These are done to strengthen the muscles that support joints affected by arthritis. ? Aerobic activities. These are exercises, such as brisk walking or water aerobics, that increase your heart rate. ? Range-of-motion activities. These help your joints move more easily. ? Balance and agility exercises. Managing pain, stiffness, and swelling  If directed, apply heat to the affected area as  often as told by your health care provider. Use the heat source that your health care provider recommends, such as a moist heat pack or a heating pad. ? If you have a removable assistive device, remove it as told by your health care provider. ? Place a towel between your skin and the heat source. If your health care provider tells you to keep the assistive device on while you apply heat, place a towel between the assistive device and the heat source. ? Leave the heat on for 20-30 minutes. ? Remove the heat if your skin turns bright red. This is especially important if you are unable to feel pain, heat, or cold. You may have a greater risk of getting burned.  If directed, put ice on the affected area. To do this: ? If you have a removable assistive device, remove it as told by your health care provider. ? Put ice in a plastic bag. ? Place a towel between your skin and the bag. If your health care provider tells you to keep the assistive device on during icing, place a towel between the assistive device and  the bag. ? Leave the ice on for 20 minutes, 2-3 times a day. ? Move your fingers or toes often to reduce stiffness and swelling. ? Raise (elevate) the injured area above the level of your heart while you are sitting or lying down.      General instructions  Take over-the-counter and prescription medicines only as told by your health care provider.  Maintain a healthy weight. Follow instructions from your health care provider for weight control.  Do not use any products that contain nicotine or tobacco, such as cigarettes, e-cigarettes, and chewing tobacco. If you need help quitting, ask your health care provider.  Use assistive devices as told by your health care provider.  Keep all follow-up visits as told by your health care provider. This is important. Where to find more information  [employer] of Arthritis and Musculoskeletal and Skin Diseases: www.niams.[place].es  [employer] on Aging: http://kim-miller.com/  American College of Rheumatology: www.rheumatology.org Contact a health care provider if:  You have redness, swelling, or a feeling of warmth in a joint that gets worse.  You have a fever along with joint or muscle aches.  You develop a rash.  You have trouble doing your normal activities. Get help right away if:  You have pain that gets worse and is not relieved by pain medicine. Summary  Osteoarthritis is a type of arthritis that affects tissue covering the ends of bones in joints (cartilage).  This condition is caused by the wearing down of cartilage that covers the ends of bones.  The main symptom of this condition is pain, swelling, and stiffness in the joint.  There is no cure for this condition, but treatment can help control pain and improve joint function.

## 2020-06-26 DIAGNOSIS — G4733 Obstructive sleep apnea (adult) (pediatric): Secondary | ICD-10-CM | POA: Diagnosis not present

## 2020-06-30 ENCOUNTER — Encounter: Payer: Self-pay | Admitting: Family Medicine

## 2020-06-30 NOTE — Progress Notes (Signed)
Acute Office Visit  Subjective:    Patient ID: Whitney Eaton, female    DOB: 1950-07-15, 70 y.o.   MRN: [id number]  Chief Complaint  Patient presents with  . Follow-up    HPI Patient is in today for follow-up of pneumonia.  This is diagnosed last Thursday.  Patient has completed her Z-Pak.  She has 2 days left of Augmentin.  She is using the [employer] inhaler which is helping.  And she is completed her prednisone.  She still feels a little tight in her chest but much better.  Still having a little bit of cough and some shortness of breath/wheezing.  Past Medical History:  Diagnosis Date  . Diabetes ([place])   . Hyperlipemia   . Hypertension   . Psoriasis   . Sleep apnea   . Thyroid disease     Past Surgical History:  Procedure Laterality Date  . ABDOMINAL HYSTERECTOMY    . CHOLECYSTECTOMY  2011  . HYSTERECTOMY ABDOMINAL WITH SALPINGECTOMY    . OOPHORECTOMY  1980    Family History  Problem Relation Age of Onset  . Alzheimer's disease Mother   . Breast cancer Mother   . Arthritis Mother   . Diabetes Mother   . CAD Father   . Diabetes Father   . Parkinson's disease Father   . Breast cancer Sister   . Glaucoma Maternal Grandmother   . Heart disease Other   . Neurologic Disorder Brother     Social History   Socioeconomic History  . Marital status: Married    Spouse name: Not on file  . Number of children: 2  . Years of education: Not on file  . Highest education level: Not on file  Occupational History  . Occupation: homemaker  Tobacco Use  . Smoking status: Never Smoker  . Smokeless tobacco: Never Used  Vaping Use  . Vaping Use: Never used  Substance and Sexual Activity  . Alcohol use: Never  . Drug use: Never  . Sexual activity: Not on file  Other Topics Concern  . Not on file  Social History Narrative  . Not on file   Social Determinants of Health   Financial Resource Strain: Low Risk   . Difficulty of Paying Living Expenses: Not hard at all  Food  Insecurity: No Food Insecurity  . Worried About Charity fundraiser in the Last Year: Never true  . Ran Out of Food in the Last Year: Never true  Transportation Needs: No Transportation Needs  . Lack of Transportation (Medical): No  . Lack of Transportation (Non-Medical): No  Physical Activity: Insufficiently Active  . Days of Exercise per Week: 4 days  . Minutes of Exercise per Session: 20 min  Stress: No Stress Concern Present  . Feeling of Stress : Only a little  Social Connections: Socially Integrated  . Frequency of Communication with Friends and Family: More than three times a week  . Frequency of Social Gatherings with Friends and Family: More than three times a week  . Attends Religious Services: More than 4 times per year  . Active Member of Clubs or Organizations: Yes  . Attends Archivist Meetings: More than 4 times per year  . Marital Status: Married  Human resources officer Violence: Not At Risk  . Fear of Current or Ex-Partner: No  . Emotionally Abused: No  . Physically Abused: No  . Sexually Abused: No    Outpatient Medications Prior to Visit  Medication Sig Dispense Refill  .  montelukast (SINGULAIR) 10 MG tablet Take 10 mg by mouth at bedtime.    Marland Kitchen ACCU-CHEK GUIDE test strip USE TO CHECK FASTING BLOOD SUGAR AS DIRECTED 50 strip 4  . Accu-Chek Softclix Lancets lancets CHECK FASTING BLOOD SUGAR ONCE DAILY 100 each 3  . albuterol (VENTOLIN HFA) 108 (90 Base) MCG/ACT inhaler Inhale 2 puffs into the lungs every 6 (six) hours as needed for wheezing or shortness of breath. 8 g 0  . atenolol (TENORMIN) 25 MG tablet TAKE 1 TABLET(25 MG) BY MOUTH DAILY 90 tablet 1  . blood glucose meter kit and supplies KIT Check sugars fasting. E11.69 (Patient taking differently: Check sugars fasting. E11.69 morning and evening) 1 each 0  . Budeson-Glycopyrrol-Formoterol (BREZTRI AEROSPHERE) 160-9-4.8 MCG/ACT AERO Inhale 2 puffs into the lungs in the morning and at bedtime. 10.7 g 0  .  CALCIUM-VITAMIN D PO Take by mouth daily.    Marland Kitchen estradiol (ESTRACE) 0.1 MG/GM vaginal cream SMARTSIG:Gram(s) Vaginal 3 Times a Week    . fluticasone (FLONASE) 50 MCG/ACT nasal spray Place 2 sprays into both nostrils daily. 16 g 6  . Ixekizumab (TALTZ) 80 MG/ML SOAJ Inject into the skin. Every 2 weeks. Done by Payton Mccallum.    . levothyroxine (SYNTHROID) 150 MCG tablet Take 1 tablet (150 mcg total) by mouth daily. 90 tablet 1  . loratadine (CLARITIN) 10 MG tablet Take 1 tablet (10 mg total) by mouth daily. 90 tablet 1  . meloxicam (MOBIC) 15 MG tablet TAKE 1 TABLET(15 MG) BY MOUTH DAILY 90 tablet 1  . metFORMIN (GLUCOPHAGE) 1000 MG tablet TAKE 1 TABLET(1000 MG) BY MOUTH TWICE DAILY WITH A MEAL 180 tablet 0  . Multiple Vitamin (MULTI-VITAMIN) tablet Take by mouth.    . Omega-3 1000 MG CAPS Take by mouth.    . tamsulosin (FLOMAX) 0.4 MG CAPS capsule Take 0.4 mg by mouth 2 (two) times daily.    . traMADol (ULTRAM) 50 MG tablet Take 1 tablet (50 mg total) by mouth every 8 (eight) hours as needed. 30 tablet 2  . Turmeric 500 MG TABS Take by mouth.    Marland Kitchen UNABLE TO FIND Med Name: D-Mannose Powder    . valsartan-hydrochlorothiazide (DIOVAN-HCT) 320-25 MG tablet TAKE 1 TABLET BY MOUTH DAILY 90 tablet 0  . zolpidem (AMBIEN) 10 MG tablet Take 1 tablet (10 mg total) by mouth at bedtime. 30 tablet 3  . Vitamin D, Ergocalciferol, (DRISDOL) 1.25 MG (50000 UNIT) CAPS capsule Take 1 capsule (50,000 Units total) by mouth every 7 (seven) days. (Patient not taking: Reported on 06/25/2020) 13 capsule 3   No facility-administered medications prior to visit.    Allergies  Allergen Reactions  . Crestor [Rosuvastatin]     myalgia  . Fenofibrate     myalgia  . Jardiance [Empagliflozin]     Nausea.  . Lipitor [Atorvastatin]     myalgia    Review of Systems  Constitutional: Negative for chills, fatigue and fever.  HENT: Positive for congestion and ear pain (Right). Negative for sore throat.   Respiratory: Positive for  cough and shortness of breath.   Cardiovascular: Negative for chest pain.  Neurological: Positive for headaches.       Objective:    Physical Exam Vitals reviewed.  Constitutional:      Appearance: Normal appearance. She is normal weight.  HENT:     Right Ear: Tympanic membrane, ear canal and external ear normal.     Left Ear: Tympanic membrane, ear canal and external ear normal.  Nose: Nose normal.     Mouth/Throat:     Pharynx: Oropharynx is clear.  Cardiovascular:     Rate and Rhythm: Normal rate and regular rhythm.     Heart sounds: Normal heart sounds. No murmur heard.   Pulmonary:     Effort: Pulmonary effort is normal. No respiratory distress.     Breath sounds: Normal breath sounds.  Neurological:     Mental Status: She is alert and oriented to person, place, and time.  Psychiatric:        Mood and Affect: Mood normal.        Behavior: Behavior normal.     BP (!) 150/80   Pulse 64   Temp (!) 96.8 F (36 C)   Resp 18   Ht 5' 8"  (1.727 m)   Wt 286 lb (129.7 kg)   BMI 43.49 kg/m  Wt Readings from Last 3 Encounters:  06/25/20 286 lb (129.7 kg)  06/25/20 295 lb (133.8 kg)  06/18/20 289 lb (131.1 kg)    Health Maintenance Due  Topic Date Due  . Hepatitis C Screening  Never done  . TETANUS/TDAP  Never done  . PNA vac Low Risk Adult (2 of 2 - PPSV23) 08/05/2017    There are no preventive care reminders to display for this patient.   Lab Results  Component Value Date   TSH 3.510 01/17/2020   Lab Results  Component Value Date   WBC 7.5 04/22/2020   HGB 13.1 04/22/2020   HCT 39.9 04/22/2020   MCV 95 04/22/2020   PLT 206 04/22/2020   Lab Results  Component Value Date   NA 138 04/22/2020   K 3.9 04/22/2020   CO2 24 04/22/2020   GLUCOSE 97 04/22/2020   BUN 20 04/22/2020   CREATININE 0.83 04/22/2020   BILITOT 0.6 04/22/2020   ALKPHOS 86 04/22/2020   AST 23 04/22/2020   ALT 24 04/22/2020   PROT 7.2 04/22/2020   ALBUMIN 4.2 04/22/2020    CALCIUM 9.7 04/22/2020   Lab Results  Component Value Date   CHOL 183 04/22/2020   Lab Results  Component Value Date   HDL 43 04/22/2020   Lab Results  Component Value Date   LDLCALC 111 (H) 04/22/2020   Lab Results  Component Value Date   TRIG 166 (H) 04/22/2020   Lab Results  Component Value Date   CHOLHDL 4.3 04/22/2020   Lab Results  Component Value Date   HGBA1C 6.5 (H) 04/22/2020       Assessment & Plan:  1. Community acquired bilateral lower lobe pneumonia Complete augmentin Repeat cxr in 1 month.  2. Allergic asthma, mild intermittent, with acute exacerbation  Continue on breztri 2 puffs twice a day.  Meds ordered this encounter  Medications  . Budeson-Glycopyrrol-Formoterol (BREZTRI AEROSPHERE) 160-9-4.8 MCG/ACT AERO    Sig: Inhale 2 puffs into the lungs in the morning and at bedtime.    Dispense:  10.7 g    Refill:  3    Follow-up: Return in about 1 month (around 07/25/2020) for chonic fasting visit.  An After Visit Summary was printed and given to the patient.  Rochel Brome, MD [facility] [phone]

## 2020-07-03 DIAGNOSIS — I517 Cardiomegaly: Secondary | ICD-10-CM | POA: Diagnosis not present

## 2020-07-04 ENCOUNTER — Telehealth (INDEPENDENT_AMBULATORY_CARE_PROVIDER_SITE_OTHER): Payer: Medicare Other | Admitting: Family Medicine

## 2020-07-04 ENCOUNTER — Other Ambulatory Visit: Payer: Self-pay

## 2020-07-04 ENCOUNTER — Encounter: Payer: Self-pay | Admitting: Family Medicine

## 2020-07-04 DIAGNOSIS — L304 Erythema intertrigo: Secondary | ICD-10-CM | POA: Diagnosis not present

## 2020-07-04 DIAGNOSIS — B370 Candidal stomatitis: Secondary | ICD-10-CM

## 2020-07-04 MED ORDER — FLUCONAZOLE 100 MG PO TABS
100.0000 mg | ORAL_TABLET | Freq: Every day | ORAL | 0 refills | Status: DC
Start: 2020-07-04 — End: 2020-07-12

## 2020-07-04 MED ORDER — NYSTATIN 100000 UNIT/ML MT SUSP: 5.0000 mL | Freq: Four times a day (QID) | OROMUCOSAL | 0 refills | Status: DC

## 2020-07-04 NOTE — Progress Notes (Signed)
Virtual Visit via Video Note   This visit type was conducted due to national recommendations for restrictions regarding the COVID-19 Pandemic (e.g. social distancing) in an effort to limit this patient's exposure and mitigate transmission in our community.  Due to her co-morbid illnesses, this patient is at least at moderate risk for complications without adequate follow up.  This format is felt to be most appropriate for this patient at this time.  All issues noted in this document were discussed and addressed.  A limited physical exam was performed with this format.  A verbal consent was obtained for the virtual visit.   Patient Location:home Provider Location:office Evaluation Performed:acute  Acute Office Visit  Subjective:    Patient ID: Whitney Eaton, female    DOB: 1950-06-16, 71 y.o.   MRN: [id number]  Chief Complaint  Patient presents with  . Rash    HPI Patient is in today for inguinal rash, intertrigo and thrush. She has completed prednisone. She is using breztri inhaler.  Her tongue is very sore and has a white film.   Past Medical History:  Diagnosis Date  . Diabetes ([place])   . Hyperlipemia   . Hypertension   . Psoriasis   . Sleep apnea   . Thyroid disease     Past Surgical History:  Procedure Laterality Date  . ABDOMINAL HYSTERECTOMY    . CHOLECYSTECTOMY  2011  . HYSTERECTOMY ABDOMINAL WITH SALPINGECTOMY    . OOPHORECTOMY  1980    Family History  Problem Relation Age of Onset  . Alzheimer's disease Mother   . Breast cancer Mother   . Arthritis Mother   . Diabetes Mother   . CAD Father   . Diabetes Father   . Parkinson's disease Father   . Breast cancer Sister   . Glaucoma Maternal Grandmother   . Heart disease Other   . Neurologic Disorder Brother     Social History   Socioeconomic History  . Marital status: Married    Spouse name: Not on file  . Number of children: 2  . Years of education: Not on file  . Highest education level: Not on  file  Occupational History  . Occupation: homemaker  Tobacco Use  . Smoking status: Never Smoker  . Smokeless tobacco: Never Used  Vaping Use  . Vaping Use: Never used  Substance and Sexual Activity  . Alcohol use: Never  . Drug use: Never  . Sexual activity: Not on file  Other Topics Concern  . Not on file  Social History Narrative  . Not on file   Social Determinants of Health   Financial Resource Strain: Low Risk   . Difficulty of Paying Living Expenses: Not hard at all  Food Insecurity: No Food Insecurity  . Worried About Charity fundraiser in the Last Year: Never true  . Ran Out of Food in the Last Year: Never true  Transportation Needs: No Transportation Needs  . Lack of Transportation (Medical): No  . Lack of Transportation (Non-Medical): No  Physical Activity: Insufficiently Active  . Days of Exercise per Week: 4 days  . Minutes of Exercise per Session: 20 min  Stress: No Stress Concern Present  . Feeling of Stress : Only a little  Social Connections: Socially Integrated  . Frequency of Communication with Friends and Family: More than three times a week  . Frequency of Social Gatherings with Friends and Family: More than three times a week  . Attends Religious Services: More than  4 times per year  . Active Member of Clubs or Organizations: Yes  . Attends Archivist Meetings: More than 4 times per year  . Marital Status: Married  Human resources officer Violence: Not At Risk  . Fear of Current or Ex-Partner: No  . Emotionally Abused: No  . Physically Abused: No  . Sexually Abused: No    Outpatient Medications Prior to Visit  Medication Sig Dispense Refill  . ACCU-CHEK GUIDE test strip USE TO CHECK FASTING BLOOD SUGAR AS DIRECTED 50 strip 4  . Accu-Chek Softclix Lancets lancets CHECK FASTING BLOOD SUGAR ONCE DAILY 100 each 3  . albuterol (VENTOLIN HFA) 108 (90 Base) MCG/ACT inhaler Inhale 2 puffs into the lungs every 6 (six) hours as needed for wheezing or  shortness of breath. 8 g 0  . atenolol (TENORMIN) 25 MG tablet TAKE 1 TABLET(25 MG) BY MOUTH DAILY 90 tablet 1  . blood glucose meter kit and supplies KIT Check sugars fasting. E11.69 (Patient taking differently: Check sugars fasting. E11.69 morning and evening) 1 each 0  . Budeson-Glycopyrrol-Formoterol (BREZTRI AEROSPHERE) 160-9-4.8 MCG/ACT AERO Inhale 2 puffs into the lungs in the morning and at bedtime. 10.7 g 0  . Budeson-Glycopyrrol-Formoterol (BREZTRI AEROSPHERE) 160-9-4.8 MCG/ACT AERO Inhale 2 puffs into the lungs in the morning and at bedtime. 10.7 g 3  . CALCIUM-VITAMIN D PO Take by mouth daily.    Marland Kitchen estradiol (ESTRACE) 0.1 MG/GM vaginal cream SMARTSIG:Gram(s) Vaginal 3 Times a Week    . fluticasone (FLONASE) 50 MCG/ACT nasal spray Place 2 sprays into both nostrils daily. 16 g 6  . Ixekizumab (TALTZ) 80 MG/ML SOAJ Inject into the skin. Every 2 weeks. Done by Payton Mccallum.    . levothyroxine (SYNTHROID) 150 MCG tablet Take 1 tablet (150 mcg total) by mouth daily. 90 tablet 1  . loratadine (CLARITIN) 10 MG tablet Take 1 tablet (10 mg total) by mouth daily. 90 tablet 1  . meloxicam (MOBIC) 15 MG tablet TAKE 1 TABLET(15 MG) BY MOUTH DAILY 90 tablet 1  . metFORMIN (GLUCOPHAGE) 1000 MG tablet TAKE 1 TABLET(1000 MG) BY MOUTH TWICE DAILY WITH A MEAL 180 tablet 0  . montelukast (SINGULAIR) 10 MG tablet Take 1 tablet (10 mg total) by mouth at bedtime. 30 tablet 11  . Multiple Vitamin (MULTI-VITAMIN) tablet Take by mouth.    . Omega-3 1000 MG CAPS Take by mouth.    . tamsulosin (FLOMAX) 0.4 MG CAPS capsule Take 0.4 mg by mouth 2 (two) times daily.    . traMADol (ULTRAM) 50 MG tablet Take 1 tablet (50 mg total) by mouth every 8 (eight) hours as needed. 30 tablet 2  . Turmeric 500 MG TABS Take by mouth.    Marland Kitchen UNABLE TO FIND Med Name: D-Mannose Powder    . valsartan-hydrochlorothiazide (DIOVAN-HCT) 320-25 MG tablet TAKE 1 TABLET BY MOUTH DAILY 90 tablet 0  . zolpidem (AMBIEN) 10 MG tablet Take 1 tablet (10  mg total) by mouth at bedtime. 30 tablet 3   No facility-administered medications prior to visit.    Allergies  Allergen Reactions  . Crestor [Rosuvastatin]     myalgia  . Fenofibrate     myalgia  . Jardiance [Empagliflozin]     Nausea.  . Lipitor [Atorvastatin]     myalgia    Review of Systems  Constitutional: Negative for chills and fever.  Respiratory: Negative for cough and shortness of breath.        Objective:    Physical Exam Vitals reviewed.  Constitutional:  Appearance: Normal appearance.  HENT:     Mouth/Throat:     Comments: thrush Neurological:     Mental Status: She is alert.     There were no vitals taken for this visit. Wt Readings from Last 3 Encounters:  06/25/20 286 lb (129.7 kg)  06/25/20 295 lb (133.8 kg)  06/18/20 289 lb (131.1 kg)    Health Maintenance Due  Topic Date Due  . Hepatitis C Screening  Never done  . TETANUS/TDAP  Never done  . PNA vac Low Risk Adult (2 of 2 - PPSV23) 08/05/2017    There are no preventive care reminders to display for this patient.   Lab Results  Component Value Date   TSH 3.510 01/17/2020   Lab Results  Component Value Date   WBC 7.5 04/22/2020   HGB 13.1 04/22/2020   HCT 39.9 04/22/2020   MCV 95 04/22/2020   PLT 206 04/22/2020   Lab Results  Component Value Date   NA 138 04/22/2020   K 3.9 04/22/2020   CO2 24 04/22/2020   GLUCOSE 97 04/22/2020   BUN 20 04/22/2020   CREATININE 0.83 04/22/2020   BILITOT 0.6 04/22/2020   ALKPHOS 86 04/22/2020   AST 23 04/22/2020   ALT 24 04/22/2020   PROT 7.2 04/22/2020   ALBUMIN 4.2 04/22/2020   CALCIUM 9.7 04/22/2020   Lab Results  Component Value Date   CHOL 183 04/22/2020   Lab Results  Component Value Date   HDL 43 04/22/2020   Lab Results  Component Value Date   LDLCALC 111 (H) 04/22/2020   Lab Results  Component Value Date   TRIG 166 (H) 04/22/2020   Lab Results  Component Value Date   CHOLHDL 4.3 04/22/2020   Lab Results   Component Value Date   HGBA1C 6.5 (H) 04/22/2020       Assessment & Plan:  1. Thrush: nystatin swish and spit four times a day x 1 week. 2. Intertrigo: fluconazole 100 mg one daily x 7 days.     Meds ordered this encounter  Medications  . fluconazole (DIFLUCAN) 100 MG tablet    Sig: Take 1 tablet (100 mg total) by mouth daily.    Dispense:  7 tablet    Refill:  0  . nystatin (MYCOSTATIN) 100000 UNIT/ML suspension    Sig: Take 5 mLs (500,000 Units total) by mouth 4 (four) times daily.    Dispense:  473 mL    Refill:  0    No orders of the defined types were placed in this encounter.   Time:  Today, I have spent 8 minutes with the patient with telehealth technology discussing the above problems.    Follow Up:  In Person prn  An After Visit Summary was printed and given to the patient.  Rochel Brome, MD [facility] [phone]

## 2020-07-09 ENCOUNTER — Encounter: Payer: Self-pay | Admitting: Family Medicine

## 2020-07-09 ENCOUNTER — Other Ambulatory Visit: Payer: Self-pay | Admitting: Family Medicine

## 2020-07-12 ENCOUNTER — Encounter: Payer: Self-pay | Admitting: Nurse Practitioner

## 2020-07-12 ENCOUNTER — Ambulatory Visit (INDEPENDENT_AMBULATORY_CARE_PROVIDER_SITE_OTHER): Payer: Medicare Other | Admitting: Nurse Practitioner

## 2020-07-12 VITALS — BP 152/84 | HR 66 | Temp 97.1°F | Ht 68.0 in | Wt 277.0 lb

## 2020-07-12 DIAGNOSIS — J4521 Mild intermittent asthma with (acute) exacerbation: Secondary | ICD-10-CM

## 2020-07-12 DIAGNOSIS — J301 Allergic rhinitis due to pollen: Secondary | ICD-10-CM | POA: Diagnosis not present

## 2020-07-12 NOTE — Progress Notes (Signed)
Acute Office Visit  Subjective:    Patient ID: Whitney Eaton, female    DOB: 02-Mar-1951, 70 y.o.   MRN: [id number]  Chief Complaint  Patient presents with  . Chest congestion        HPI Patient is in today for evaluation of chest congestion. Whitney Eaton was recently treated for CAP with courses of antibiotics, Prednisone, and inhalers.She developed thrush and was treated with antifungal medication on 07/04/20. She as a past medical history of allergic rhinitis and asthma. Denies current fever, dyspnea, or wheezing. She has not been seen by allergy/asthma specialist.   Past Medical History:  Diagnosis Date  . Diabetes ([place])   . Hyperlipemia   . Hypertension   . Psoriasis   . Sleep apnea   . Thyroid disease     Past Surgical History:  Procedure Laterality Date  . ABDOMINAL HYSTERECTOMY    . CHOLECYSTECTOMY  2011  . HYSTERECTOMY ABDOMINAL WITH SALPINGECTOMY    . OOPHORECTOMY  1980    Family History  Problem Relation Age of Onset  . Alzheimer's disease Mother   . Breast cancer Mother   . Arthritis Mother   . Diabetes Mother   . CAD Father   . Diabetes Father   . Parkinson's disease Father   . Breast cancer Sister   . Glaucoma Maternal Grandmother   . Heart disease Other   . Neurologic Disorder Brother     Social History   Socioeconomic History  . Marital status: Married    Spouse name: Not on file  . Number of children: 2  . Years of education: Not on file  . Highest education level: Not on file  Occupational History  . Occupation: homemaker  Tobacco Use  . Smoking status: Never Smoker  . Smokeless tobacco: Never Used  Vaping Use  . Vaping Use: Never used  Substance and Sexual Activity  . Alcohol use: Never  . Drug use: Never  . Sexual activity: Not on file  Other Topics Concern  . Not on file  Social History Narrative  . Not on file   Social Determinants of Health   Financial Resource Strain: Low Risk   . Difficulty of Paying Living Expenses: Not hard  at all  Food Insecurity: No Food Insecurity  . Worried About Charity fundraiser in the Last Year: Never true  . Ran Out of Food in the Last Year: Never true  Transportation Needs: No Transportation Needs  . Lack of Transportation (Medical): No  . Lack of Transportation (Non-Medical): No  Physical Activity: Insufficiently Active  . Days of Exercise per Week: 4 days  . Minutes of Exercise per Session: 20 min  Stress: No Stress Concern Present  . Feeling of Stress : Only a little  Social Connections: Socially Integrated  . Frequency of Communication with Friends and Family: More than three times a week  . Frequency of Social Gatherings with Friends and Family: More than three times a week  . Attends Religious Services: More than 4 times per year  . Active Member of Clubs or Organizations: Yes  . Attends Archivist Meetings: More than 4 times per year  . Marital Status: Married  Human resources officer Violence: Not At Risk  . Fear of Current or Ex-Partner: No  . Emotionally Abused: No  . Physically Abused: No  . Sexually Abused: No    Outpatient Medications Prior to Visit  Medication Sig Dispense Refill  . ACCU-CHEK GUIDE test strip USE TO CHECK  FASTING BLOOD SUGAR AS DIRECTED 50 strip 4  . Accu-Chek Softclix Lancets lancets CHECK FASTING BLOOD SUGAR ONCE DAILY 100 each 3  . albuterol (VENTOLIN HFA) 108 (90 Base) MCG/ACT inhaler INHALE 2 PUFFS INTO THE LUNGS EVERY 6 HOURS AS NEEDED FOR WHEEZING OR SHORTNESS OF BREATH 8.5 g 1  . atenolol (TENORMIN) 25 MG tablet TAKE 1 TABLET(25 MG) BY MOUTH DAILY 90 tablet 1  . blood glucose meter kit and supplies KIT Check sugars fasting. E11.69 (Patient taking differently: Check sugars fasting. E11.69 morning and evening) 1 each 0  . Budeson-Glycopyrrol-Formoterol (BREZTRI AEROSPHERE) 160-9-4.8 MCG/ACT AERO Inhale 2 puffs into the lungs in the morning and at bedtime. 10.7 g 0  . CALCIUM-VITAMIN D PO Take by mouth daily.    Marland Kitchen estradiol (ESTRACE)  0.1 MG/GM vaginal cream SMARTSIG:Gram(s) Vaginal 3 Times a Week    . fluticasone (FLONASE) 50 MCG/ACT nasal spray Place 2 sprays into both nostrils daily. 16 g 6  . Ixekizumab (TALTZ) 80 MG/ML SOAJ Inject into the skin. Every 2 weeks. Done by Payton Mccallum.    . levothyroxine (SYNTHROID) 150 MCG tablet Take 1 tablet (150 mcg total) by mouth daily. 90 tablet 1  . loratadine (CLARITIN) 10 MG tablet Take 1 tablet (10 mg total) by mouth daily. 90 tablet 1  . meloxicam (MOBIC) 15 MG tablet TAKE 1 TABLET(15 MG) BY MOUTH DAILY 90 tablet 1  . metFORMIN (GLUCOPHAGE) 1000 MG tablet TAKE 1 TABLET(1000 MG) BY MOUTH TWICE DAILY WITH A MEAL 180 tablet 0  . montelukast (SINGULAIR) 10 MG tablet Take 1 tablet (10 mg total) by mouth at bedtime. 30 tablet 11  . Multiple Vitamin (MULTI-VITAMIN) tablet Take by mouth.    . nystatin (MYCOSTATIN) 100000 UNIT/ML suspension Take 5 mLs (500,000 Units total) by mouth 4 (four) times daily. 473 mL 0  . Omega-3 1000 MG CAPS Take by mouth.    . tamsulosin (FLOMAX) 0.4 MG CAPS capsule Take 0.4 mg by mouth 2 (two) times daily.    . traMADol (ULTRAM) 50 MG tablet Take 1 tablet (50 mg total) by mouth every 8 (eight) hours as needed. 30 tablet 2  . Turmeric 500 MG TABS Take by mouth.    Marland Kitchen UNABLE TO FIND Med Name: D-Mannose Powder    . valsartan-hydrochlorothiazide (DIOVAN-HCT) 320-25 MG tablet TAKE 1 TABLET BY MOUTH DAILY 90 tablet 0  . Vitamin D, Ergocalciferol, (DRISDOL) 1.25 MG (50000 UNIT) CAPS capsule Take 1 capsule by mouth once a week.    . zolpidem (AMBIEN) 10 MG tablet Take 1 tablet (10 mg total) by mouth at bedtime. 30 tablet 3  . Budeson-Glycopyrrol-Formoterol (BREZTRI AEROSPHERE) 160-9-4.8 MCG/ACT AERO Inhale 2 puffs into the lungs in the morning and at bedtime. 10.7 g 3  . fluconazole (DIFLUCAN) 100 MG tablet Take 1 tablet (100 mg total) by mouth daily. 7 tablet 0   No facility-administered medications prior to visit.    Allergies  Allergen Reactions  . Crestor  [Rosuvastatin]     myalgia  . Fenofibrate     myalgia  . Jardiance [Empagliflozin]     Nausea.  . Lipitor [Atorvastatin]     myalgia    Review of Systems  Constitutional: Negative for appetite change, fatigue and unexpected weight change.  HENT: Positive for congestion. Negative for ear pain, rhinorrhea, sinus pressure, sinus pain and tinnitus.   Eyes: Negative for pain.  Respiratory: Positive for cough. Negative for shortness of breath.   Cardiovascular: Negative for chest pain, palpitations and leg swelling.  Gastrointestinal: Negative for abdominal pain, constipation, diarrhea, nausea and vomiting.  Endocrine: Negative for cold intolerance, heat intolerance, polydipsia, polyphagia and polyuria.  Genitourinary: Negative for dysuria, frequency and hematuria.  Musculoskeletal: Negative for arthralgias, back pain, joint swelling and myalgias.  Skin: Negative for rash.  Allergic/Immunologic: Positive for environmental allergies.  Neurological: Negative for dizziness and headaches.  Hematological: Negative for adenopathy.  Psychiatric/Behavioral: Negative for decreased concentration and sleep disturbance. The patient is not nervous/anxious.        Objective:    Physical Exam Vitals reviewed.  Constitutional:      Appearance: Normal appearance.  HENT:     Head: Normocephalic.     Right Ear: Tympanic membrane normal.     Left Ear: Tympanic membrane normal.     Nose: Nose normal.     Mouth/Throat:     Mouth: Mucous membranes are moist.  Cardiovascular:     Rate and Rhythm: Normal rate and regular rhythm.     Pulses: Normal pulses.     Heart sounds: Normal heart sounds.  Pulmonary:     Effort: Pulmonary effort is normal.     Breath sounds: Normal breath sounds.  Abdominal:     General: Bowel sounds are normal.     Palpations: Abdomen is soft.  Musculoskeletal:        General: Normal range of motion.     Cervical back: Neck supple.  Skin:    General: Skin is warm and  dry.     Capillary Refill: Capillary refill takes less than 2 seconds.  Neurological:     General: No focal deficit present.     Mental Status: She is alert and oriented to person, place, and time.  Psychiatric:        Mood and Affect: Mood normal.        Behavior: Behavior normal.     BP (!) 152/84 (BP Location: Left Arm, Patient Position: Sitting)   Pulse 66   Temp (!) 97.1 F (36.2 C) (Temporal)   Ht 5' 8"  (1.727 m)   Wt 277 lb (125.6 kg)   SpO2 96%   BMI 42.12 kg/m  Wt Readings from Last 3 Encounters:  07/12/20 277 lb (125.6 kg)  06/25/20 286 lb (129.7 kg)  06/25/20 295 lb (133.8 kg)    Health Maintenance Due  Topic Date Due  . Hepatitis C Screening  Never done  . TETANUS/TDAP  Never done  . PNA vac Low Risk Adult (2 of 2 - PPSV23) 08/05/2017     Lab Results  Component Value Date   TSH 3.510 01/17/2020   Lab Results  Component Value Date   WBC 7.5 04/22/2020   HGB 13.1 04/22/2020   HCT 39.9 04/22/2020   MCV 95 04/22/2020   PLT 206 04/22/2020   Lab Results  Component Value Date   NA 138 04/22/2020   K 3.9 04/22/2020   CO2 24 04/22/2020   GLUCOSE 97 04/22/2020   BUN 20 04/22/2020   CREATININE 0.83 04/22/2020   BILITOT 0.6 04/22/2020   ALKPHOS 86 04/22/2020   AST 23 04/22/2020   ALT 24 04/22/2020   PROT 7.2 04/22/2020   ALBUMIN 4.2 04/22/2020   CALCIUM 9.7 04/22/2020   Lab Results  Component Value Date   CHOL 183 04/22/2020   Lab Results  Component Value Date   HDL 43 04/22/2020   Lab Results  Component Value Date   LDLCALC 111 (H) 04/22/2020   Lab Results  Component Value Date  TRIG 166 (H) 04/22/2020   Lab Results  Component Value Date   CHOLHDL 4.3 04/22/2020   Lab Results  Component Value Date   HGBA1C 6.5 (H) 04/22/2020       Assessment & Plan:    1. Allergic asthma, mild intermittent, with acute exacerbation - Ambulatory referral to Allergy  2. Seasonal allergic rhinitis due to pollen - Ambulatory referral to  Allergy  We will call you with allergist referral  Follow-up as needed  Follow-up: As needed  Signed, Rip Harbour, NP

## 2020-07-12 NOTE — Patient Instructions (Addendum)
Asthma and Physical Activity Physical activity is an important part of a healthy lifestyle. If you have asthma, it is important to exercise because physical activity can help you to:  Control your asthma.  Maintain your weight or lose weight.  Increase your energy.  Decrease stress and anxiety.  Lower your risk of getting sick.  Improve your heart health. However, asthma symptoms can flare up when you are physically active or exercising. You can learn how to control your asthma and prevent symptoms during exercise. This will help you to remain physically active. How can asthma affect my ability to be physically active? When you have asthma, physical activity can cause you to have symptoms such as:  Wheezing. This may sound like whistling while breathing.  A feeling of tightness in the chest.  Sore throat.  Coughing.  Shortness of breath.  Tiredness (fatigue) with minimal activity.  Increased sputum production.  Chest pain. What actions can I take to prevent asthma problems during physical activity? Pulmonary rehabilitation Enroll in a pulmonary rehabilitation program. Benefits of this type of program include:  Education on lung diseases.  Classes that teach you how to exercise and be more active while decreasing your shortness of breath.  A group setting that allows you to talk with others who have asthma. Asthma action plan Follow the asthma action plan set by your health care provider. Your personal asthma plan may include:  Taking your medicines as told by your health care provider.  Avoiding your asthma triggers, except physical activity. Triggers may include cold air, dust, pollen, pet dander, and air pollution.  Tracking your asthma control.  Using a peak flow meter.  Being aware of worsening symptoms.  Knowing when to seek emergency care. Proper breathing During exercise, follow these tips for proper breathing:  Breathe in before starting the  exercise and breathe out during the hardest part of the exercise.  Take slow breaths.  Pace yourself and do not try to go too fast.  While breathing out, purse your lips. Before beginning any exercise program or new activity, talk with your health care provider.   Medicines If physical activity triggers your asthma, your health care provider may order the following medicines:  A rescue inhaler (short-acting beta2-agonist) for you to use shortly before physical activity or exercise. Its effects may reduce exercise-related symptoms for 2-3 hours.  A long-acting beta2-agonist that can offer up to 12 hours of relief if taken daily.  Leukotriene modifiers. These pills are taken several hours before physical activity or exercise to help prevent asthma symptoms that are caused by exercise.  Long-term control medicines. These will be given if you have severe or frequent asthma symptoms during or after exercise. These symptoms may also mean that your asthma is not well controlled.   General information  Exercise indoors when the air is dry or during allergy season.  Try to breathe in warm, moist air by wearing a scarf over your nose and mouth or breathing only through your nose.  Spend a few minutes warming up before your workout.  Cool down after exercise. What should I do if my asthma symptoms get worse? Contact your health care provider if your asthma symptoms are getting worse. Your asthma is getting worse if:  You have symptoms more often.  Your symptoms are more severe.  Your symptoms get worse at night and make you lose sleep.  Your peak flow number is lower than your personal best or changes a lot from  day to day.  Your asthma medicines do not work as well as they used to.  You use your rescue inhaler more often. If you use your rescue inhaler more than 2 days a week, your asthma is not well controlled.  You go to the emergency room or see your health care provider because of  an asthma attack. Where to find support  Ask your health care provider about signing up for a pulmonary rehabilitation program.  Ask your health care provider about asthma support groups.  Visit your local community health department.  Check out local hospitals' community health programs. Where can I get more information?  Your health care provider.  American Lung Association: lung.org  National Heart, Lung, and Blood Institute: https://www.[place].biz/ Contact a health care provider if:  You have trouble walking and talking because you are out of breath. Get help right away if:  Your lips or fingernails are blue.  You are not able to breathe or catch your breath. Summary  Physical activity is an important part of a healthy lifestyle. However, if you have asthma, your symptoms can flare up during exercise or physical activity.  You can prevent problems during physical activity by doing pulmonary rehabilitation, following an asthma action plan, doing proper breathing, and using medicines.  Talk with your health care provider before starting any exercise program or new activity. This information is not intended to replace advice given to you by your health care provider. Make sure you discuss any questions you have with your health care provider. Document Revised: 06/07/2018 Document Reviewed: 05/04/2017 Elsevier Patient Education  2021 [place]. Shortness of Breath, Adult Shortness of breath means you have trouble breathing. Shortness of breath could be a sign of a medical problem. Follow these instructions at home:  Watch for any changes in your symptoms.  Do not use any products that contain nicotine or tobacco, such as cigarettes, e-cigarettes, and chewing tobacco.  Do not smoke. Smoking can cause shortness of breath. If you need help to quit smoking, ask your doctor.  Avoid things that can make it harder to breathe, such as: ? Mold. ? Dust. ? Air pollution. ? Chemical  smells. ? Things that can cause allergy symptoms (allergens), if you have allergies.  Keep your living space clean. Use products that help remove mold and dust.  Rest as needed. Slowly return to your normal activities.  Take over-the-counter and prescription medicines only as told by your doctor. This includes oxygen therapy and inhaled medicines.  Keep all follow-up visits as told by your doctor. This is important.   Contact a doctor if:  Your condition does not get better as soon as expected.  You have a hard time doing your normal activities, even after you rest.  You have new symptoms. Get help right away if:  Your shortness of breath gets worse.  You have trouble breathing when you are resting.  You feel light-headed or you pass out (faint).  You have a cough that is not helped by medicines.  You cough up blood.  You have pain with breathing.  You have pain in your chest, arms, shoulders, or belly (abdomen).  You have a fever.  You cannot walk up stairs.  You cannot exercise the way you normally do. These symptoms may represent a serious problem that is an emergency. Do not wait to see if the symptoms will go away. Get medical help right away. Call your local emergency services (911 in the [place] Do not  drive yourself to the hospital. Summary  Shortness of breath is when you have trouble breathing enough air. It can be a sign of a medical problem.  Avoid things that make it hard for you to breathe, such as smoking, pollution, mold, and dust.  Watch for any changes in your symptoms. Contact your doctor if you do not get better or you get worse. This information is not intended to replace advice given to you by your health care provider. Make sure you discuss any questions you have with your health care provider. Document Revised: 07/19/2017 Document Reviewed: 07/19/2017 Elsevier Patient Education  [place]. Community-Acquired Pneumonia, Adult Pneumonia  is an infection of the lungs. It causes irritation and swelling in the airways of the lungs. Mucus and fluid may also build up inside the airways. This may cause coughing and trouble breathing. One type of pneumonia can happen while you are in a hospital. A different type can happen when you are not in a hospital (community-acquired pneumonia). What are the causes? This condition is caused by germs (viruses, bacteria, or fungi). Some types of germs can spread from person to person. Pneumonia is not thought to spread from person to person.   What increases the risk? You are more likely to develop this condition if:  You have a long-term (chronic) disease, such as: ? Disease of the lungs. This may be chronic obstructive pulmonary disease (COPD) or asthma. ? Heart failure. ? Cystic fibrosis. ? Diabetes. ? Kidney disease. ? Sickle cell disease. ? HIV.  You have other health problems, such as: ? Your body's defense system (immune system) is weak. ? A condition that may cause you to breathe in fluids from your mouth and nose.  You had your spleen taken out.  You do not take good care of your teeth and mouth (poor dental hygiene).  You use or have used tobacco products.  You travel where the germs that cause this illness are common.  You are near certain animals or the places they live.  You are older than 69 years of age. What are the signs or symptoms? Symptoms of this condition include:  A cough.  A fever.  Sweating or chills.  Chest pain, often when you breathe deeply or cough.  Breathing problems, such as: ? Fast breathing. ? Trouble breathing. ? Shortness of breath.  Feeling tired (fatigued).  Muscle aches. How is this treated? Treatment for this condition depends on many things, such as:  The cause of your illness.  Your medicines.  Your other health problems. Most adults can be treated at home. Sometimes, treatment must happen in a hospital.  Treatment may  include medicines to kill germs.  Medicines may depend on which germ caused your illness. Very bad pneumonia is rare. If you get it, you may:  Have a machine to help you breathe.  Have fluid taken away from around your lungs. Follow these instructions at home: Medicines  Take over-the-counter and prescription medicines only as told by your doctor.  Take cough medicine only if you are losing sleep. Cough medicine can keep your body from taking mucus away from your lungs.  If you were prescribed an antibiotic medicine, take it as told by your doctor. Do not stop taking the antibiotic even if you start to feel better. Lifestyle  Do not drink alcohol.  Do not use any products that contain nicotine or tobacco, such as cigarettes, e-cigarettes, and chewing tobacco. If you need help quitting, ask your  doctor.  Eat a healthy diet. This includes a lot of vegetables, fruits, whole grains, low-fat dairy products, and low-fat (lean) protein.      General instructions  Rest a lot. Sleep for at least 8 hours each night.  Sleep with your head and neck raised. Put a few pillows under your head or sleep in a reclining chair.  Return to your normal activities as told by your doctor. Ask your doctor what activities are safe for you.  Drink enough fluid to keep your pee (urine) pale yellow.  If your throat is sore, rinse your mouth often with salt water. To make salt water, dissolve -1 tsp (3-6 g) of salt in 1 cup (237 mL) of warm water.  Keep all follow-up visits as told by your doctor. This is important.   How is this prevented? You can lower your risk of pneumonia by:  Getting the pneumonia shot (vaccine). These shots have different types and schedules. Ask your doctor what works best for you. Think about getting this shot if: ? You are older than 69 years of age. ? You are 8-26 years of age and:  You are being treated for cancer.  You have long-term lung disease.  You have other  problems that affect your body's defense system. Ask your doctor if you have one of these.  Getting your flu shot every year. Ask your doctor which type of shot is best for you.  Going to the dentist as often as told.  Washing your hands often with soap and water for at least 20 seconds. If you cannot use soap and water, use hand sanitizer. Contact a doctor if:  You have a fever.  You lose sleep because your cough medicine does not help. Get help right away if:  You are short of breath and this gets worse.  You have more chest pain.  Your sickness gets worse. This is very serious if: ? You are an older adult. ? Your body's defense system is weak.  You cough up blood. These symptoms may be an emergency. Do not wait to see if the symptoms will go away. Get medical help right away. Call your local emergency services (911 in the [place] Do not drive yourself to the hospital. Summary  Pneumonia is an infection of the lungs.  Community-acquired pneumonia affects people who have not been in the hospital. Certain germs can cause this infection.  This condition may be treated with medicines that kill germs.  For very bad pneumonia, you may need a hospital stay and treatment to help with breathing. This information is not intended to replace advice given to you by your health care provider. Make sure you discuss any questions you have with your health care provider. Document Revised: 11/29/2018 Document Reviewed: 11/29/2018 Elsevier Patient Education  2021 [place]. Allergic Rhinitis, Adult Allergic rhinitis is a reaction to allergens. Allergens are things that can cause an allergic reaction. This condition affects the lining inside the nose (mucous membrane). There are two types of allergic rhinitis:  Seasonal. This type is also called hay fever. It happens only during some times of the year.  Perennial. This type can happen at any time of the year. This condition cannot be  spread from person to person (is not contagious). It can be mild, worse, or very bad. It can develop at any age and may be outgrown. What are the causes? This condition may be caused by:  Pollen from grasses, trees, and weeds.  Dust mites.  Smoke.  Mold.  Car fumes.  The pee (urine), spit, or dander of pets. Dander is dead skin cells from a pet.   What increases the risk? You are more likely to develop this condition if:  You have allergies in your family.  You have problems like allergies in your family. You may have: ? Swelling of parts of your eyes and eyelids. ? Asthma. This affects how you breathe. ? Long-term redness and swelling on your skin. ? Food allergies. What are the signs or symptoms? The main symptom of this condition is a runny or stuffy nose (nasal congestion). Other symptoms may include:  Sneezing or coughing.  Itching and tearing of your eyes.  Mucus that drips down the back of your throat (postnasal drip).  Trouble sleeping.  Feeling tired.  Headache.  Sore throat. How is this treated? There is no cure for this condition. You should avoid things that you are allergic to. Treatment can help to relieve symptoms. This may include:  Medicines that block allergy symptoms, such as corticosteroids or antihistamines. These may be given as a shot, nasal spray, or pill.  Avoiding things you are allergic to.  Medicines that give you bits of what you are allergic to over time. This is called immunotherapy. It is done if other treatments do not help. You may get: ? Shots. ? Medicine under your tongue.  Stronger medicines, if other treatments do not help. Follow these instructions at home: Avoiding allergens Find out what things you are allergic to and avoid them. To do this, try these things:  If you get allergies any time of year: ? Replace carpet with wood, tile, or vinyl flooring. Carpet can trap pet dander and dust. ? Do not smoke. Do not allow  smoking in your home. ? Change your heating and air conditioning filters at least once a month.  If you get allergies only some times of the year: ? Keep windows closed when you can. ? Plan things to do outside when pollen counts are lowest. Check pollen counts before you plan things to do outside. ? When you come indoors, change your clothes and shower before you sit on furniture or bedding.   If you are allergic to a pet: ? Keep the pet out of your bedroom. ? Vacuum, sweep, and dust often.   General instructions  Take over-the-counter and prescription medicines only as told by your doctor.  Drink enough fluid to keep your pee (urine) pale yellow.  Keep all follow-up visits as told by your doctor. This is important. Where to find more information  American Academy of Allergy, Asthma & Immunology: www.aaaai.org Contact a doctor if:  You have a fever.  You get a cough that does not go away.  You make whistling sounds when you breathe (wheeze).  Your symptoms slow you down.  Your symptoms stop you from doing your normal things each day. Get help right away if:  You are short of breath. This symptom may be an emergency. Do not wait to see if the symptom will go away. Get medical help right away. Call your local emergency services (911 in the [place] Do not drive yourself to the hospital. Summary  Allergic rhinitis may be treated by taking medicines and avoiding things you are allergic to.  If you have allergies only some of the year, keep windows closed when you can at those times.  Contact your doctor if you get a fever or a cough that  does not go away. This information is not intended to replace advice given to you by your health care provider. Make sure you discuss any questions you have with your health care provider. Document Revised: 04/10/2019 Document Reviewed: 02/14/2019 Elsevier Patient Education  2021 [employer].

## 2020-07-23 ENCOUNTER — Other Ambulatory Visit: Payer: Medicare Other

## 2020-07-23 ENCOUNTER — Ambulatory Visit: Payer: Medicare Other | Admitting: Internal Medicine

## 2020-07-23 DIAGNOSIS — E782 Mixed hyperlipidemia: Secondary | ICD-10-CM | POA: Diagnosis not present

## 2020-07-23 DIAGNOSIS — E119 Type 2 diabetes mellitus without complications: Secondary | ICD-10-CM

## 2020-07-23 DIAGNOSIS — I10 Essential (primary) hypertension: Secondary | ICD-10-CM

## 2020-07-24 LAB — CBC WITH DIFF/PLATELET
Basophils Absolute: 0.1 10*3/uL (ref 0.0–0.2)
Basos: 1 %
EOS (ABSOLUTE): 0.2 10*3/uL (ref 0.0–0.4)
Eos: 2 %
Hematocrit: 38.4 % (ref 34.0–46.6)
Hemoglobin: 12.6 g/dL (ref 11.1–15.9)
Immature Grans (Abs): 0.1 10*3/uL (ref 0.0–0.1)
Immature Granulocytes: 1 %
Lymphocytes Absolute: 1.8 10*3/uL (ref 0.7–3.1)
Lymphs: 19 %
MCH: 29.8 pg (ref 26.6–33.0)
MCHC: 32.8 g/dL (ref 31.5–35.7)
MCV: 91 fL (ref 79–97)
Monocytes Absolute: 0.6 10*3/uL (ref 0.1–0.9)
Monocytes: 7 %
Neutrophils Absolute: 6.7 10*3/uL (ref 1.4–7.0)
Neutrophils: 70 %
Platelets: 236 10*3/uL (ref 150–450)
RBC: 4.23 x10E6/uL (ref 3.77–5.28)
RDW: 13.3 % (ref 11.7–15.4)
WBC: 9.4 10*3/uL (ref 3.4–10.8)

## 2020-07-24 LAB — COMPREHENSIVE METABOLIC PANEL
ALT: 21 IU/L (ref 0–32)
AST: 13 IU/L (ref 0–40)
Albumin/Globulin Ratio: 1.8 (ref 1.2–2.2)
Albumin: 4.1 g/dL (ref 3.8–4.8)
Alkaline Phosphatase: 76 IU/L (ref 44–121)
BUN/Creatinine Ratio: 21 (ref 12–28)
BUN: 17 mg/dL (ref 8–27)
Bilirubin Total: 0.5 mg/dL (ref 0.0–1.2)
CO2: 26 mmol/L (ref 20–29)
Calcium: 9.5 mg/dL (ref 8.7–10.3)
Chloride: 94 mmol/L — ABNORMAL LOW (ref 96–106)
Creatinine, Ser: 0.82 mg/dL (ref 0.57–1.00)
Globulin, Total: 2.3 g/dL (ref 1.5–4.5)
Glucose: 103 mg/dL — ABNORMAL HIGH (ref 65–99)
Potassium: 4 mmol/L (ref 3.5–5.2)
Sodium: 135 mmol/L (ref 134–144)
Total Protein: 6.4 g/dL (ref 6.0–8.5)
eGFR: 77 mL/min/{1.73_m2} (ref >59)

## 2020-07-24 LAB — LIPID PANEL
Chol/HDL Ratio: 3.2 ratio (ref 0.0–4.4)
Cholesterol, Total: 189 mg/dL (ref 100–199)
HDL: 59 mg/dL (ref >39)
LDL Chol Calc (NIH): 113 mg/dL — ABNORMAL HIGH (ref 0–99)
Triglycerides: 93 mg/dL (ref 0–149)
VLDL Cholesterol Cal: 17 mg/dL (ref 5–40)

## 2020-07-24 LAB — HEMOGLOBIN A1C
Est. average glucose Bld gHb Est-mCnc: 128 mg/dL
Hgb A1c MFr Bld: 6.1 % — ABNORMAL HIGH (ref 4.8–5.6)

## 2020-07-24 LAB — CARDIOVASCULAR RISK ASSESSMENT

## 2020-07-24 NOTE — Progress Notes (Signed)
Subjective:  Patient ID: Whitney Eaton, female    DOB: [birth year]  Age: 70 y.o. MRN: [id number]  Chief Complaint  Patient presents with  . Hypertension  . Diabetes    HPI Combined hyperlipidemia associated with type 2 diabetes mellitus (HCC) Metformin 1000 mg bid, Fish oil checks FBS twice a day ranges from 74-163, checks her feet daily, maintains a healthy diet 80% healthy-20% unhealthy.  Benign hypertension Atenolol 25 mg daily, diovan 320-25 mg daily Bp 135-155/70-80s  Other specified hypothyroidism Synthroid 150 mcg daily  Primary insomnia Ambien 10 mg qhs  Covid 19 exposure. Did not get close to her neighbor, but spoke with her from 8 feet away over the weekend. Occurred on Tuesday.   Current Outpatient Medications on File Prior to Visit  Medication Sig Dispense Refill  . ACCU-CHEK GUIDE test strip USE TO CHECK FASTING BLOOD SUGAR AS DIRECTED 50 strip 4  . Accu-Chek Softclix Lancets lancets CHECK FASTING BLOOD SUGAR ONCE DAILY 100 each 3  . albuterol (VENTOLIN HFA) 108 (90 Base) MCG/ACT inhaler INHALE 2 PUFFS INTO THE LUNGS EVERY 6 HOURS AS NEEDED FOR WHEEZING OR SHORTNESS OF BREATH 8.5 g 1  . atenolol (TENORMIN) 25 MG tablet TAKE 1 TABLET(25 MG) BY MOUTH DAILY 90 tablet 1  . blood glucose meter kit and supplies KIT Check sugars fasting. E11.69 (Patient taking differently: Check sugars fasting. E11.69 morning and evening) 1 each 0  . Budeson-Glycopyrrol-Formoterol (BREZTRI AEROSPHERE) 160-9-4.8 MCG/ACT AERO Inhale 2 puffs into the lungs in the morning and at bedtime. 10.7 g 0  . estradiol (ESTRACE) 0.1 MG/GM vaginal cream SMARTSIG:Gram(s) Vaginal 3 Times a Week    . fluticasone (FLONASE) 50 MCG/ACT nasal spray Place 2 sprays into both nostrils daily. 16 g 6  . Ixekizumab (TALTZ) 80 MG/ML SOAJ Inject into the skin. Every 2 weeks. Done by Payton Mccallum.    . levothyroxine (SYNTHROID) 150 MCG tablet Take 1 tablet (150 mcg total) by mouth daily. 90 tablet 1  . loratadine (CLARITIN) 10 MG  tablet Take 1 tablet (10 mg total) by mouth daily. 90 tablet 1  . meloxicam (MOBIC) 15 MG tablet TAKE 1 TABLET(15 MG) BY MOUTH DAILY 90 tablet 1  . metFORMIN (GLUCOPHAGE) 1000 MG tablet TAKE 1 TABLET(1000 MG) BY MOUTH TWICE DAILY WITH A MEAL 180 tablet 0  . montelukast (SINGULAIR) 10 MG tablet Take 1 tablet (10 mg total) by mouth at bedtime. 30 tablet 11  . Multiple Vitamin (MULTI-VITAMIN) tablet Take by mouth.    . Omega-3 1000 MG CAPS Take by mouth.    . tamsulosin (FLOMAX) 0.4 MG CAPS capsule Take 0.4 mg by mouth 2 (two) times daily.    Marland Kitchen UNABLE TO FIND Med Name: D-Mannose Powder    . valsartan-hydrochlorothiazide (DIOVAN-HCT) 320-25 MG tablet TAKE 1 TABLET BY MOUTH DAILY 90 tablet 0  . zolpidem (AMBIEN) 10 MG tablet Take 1 tablet (10 mg total) by mouth at bedtime. 30 tablet 3   No current facility-administered medications on file prior to visit.   Past Medical History:  Diagnosis Date  . Diabetes ([place])   . Hyperlipemia   . Hypertension   . Psoriasis   . Sleep apnea   . Thyroid disease    Past Surgical History:  Procedure Laterality Date  . ABDOMINAL HYSTERECTOMY    . CHOLECYSTECTOMY  2011  . HYSTERECTOMY ABDOMINAL WITH SALPINGECTOMY    . OOPHORECTOMY  1980    Family History  Problem Relation Age of Onset  . Alzheimer's disease Mother   .  Breast cancer Mother   . Arthritis Mother   . Diabetes Mother   . CAD Father   . Diabetes Father   . Parkinson's disease Father   . Breast cancer Sister   . Glaucoma Maternal Grandmother   . Heart disease Other   . Neurologic Disorder Brother    Social History   Socioeconomic History  . Marital status: Married    Spouse name: Not on file  . Number of children: 2  . Years of education: Not on file  . Highest education level: Not on file  Occupational History  . Occupation: homemaker  Tobacco Use  . Smoking status: Never Smoker  . Smokeless tobacco: Never Used  Vaping Use  . Vaping Use: Never used  Substance and Sexual  Activity  . Alcohol use: Never  . Drug use: Never  . Sexual activity: Not on file  Other Topics Concern  . Not on file  Social History Narrative  . Not on file   Social Determinants of Health   Financial Resource Strain: Low Risk   . Difficulty of Paying Living Expenses: Not hard at all  Food Insecurity: No Food Insecurity  . Worried About Charity fundraiser in the Last Year: Never true  . Ran Out of Food in the Last Year: Never true  Transportation Needs: No Transportation Needs  . Lack of Transportation (Medical): No  . Lack of Transportation (Non-Medical): No  Physical Activity: Insufficiently Active  . Days of Exercise per Week: 4 days  . Minutes of Exercise per Session: 20 min  Stress: No Stress Concern Present  . Feeling of Stress : Only a little  Social Connections: Socially Integrated  . Frequency of Communication with Friends and Family: More than three times a week  . Frequency of Social Gatherings with Friends and Family: More than three times a week  . Attends Religious Services: More than 4 times per year  . Active Member of Clubs or Organizations: Yes  . Attends Archivist Meetings: More than 4 times per year  . Marital Status: Married    Review of Systems  Constitutional: Negative for chills, fatigue and fever.  HENT: Negative for congestion, ear pain, rhinorrhea and sore throat.   Respiratory: Negative for cough and shortness of breath.   Cardiovascular: Negative for chest pain.  Gastrointestinal: Negative for abdominal pain, constipation, diarrhea, nausea and vomiting.  Genitourinary: Negative for dysuria and urgency.  Musculoskeletal: Negative for back pain and myalgias.  Neurological: Negative for dizziness, weakness, light-headedness and headaches.  Psychiatric/Behavioral: Negative for dysphoric mood. The patient is not nervous/anxious.      Objective:  BP 128/80   Pulse (!) 58   Temp (!) 96.5 F (35.8 C)   Ht $R'5\' 8"'qH$  (1.727 m)   Wt 278  lb (126.1 kg)   SpO2 98%   BMI 42.27 kg/m   BP/Weight 07/26/2020 07/12/2020 5/62/1308  Systolic BP 657 846 962  Diastolic BP 80 84 80  Wt. (Lbs) 278 277 286  BMI 42.27 42.12 43.49    Physical Exam Vitals reviewed.  Constitutional:      Appearance: Normal appearance. She is obese.  Neck:     Vascular: No carotid bruit.  Cardiovascular:     Rate and Rhythm: Normal rate and regular rhythm.     Pulses: Normal pulses.     Heart sounds: Normal heart sounds.  Pulmonary:     Effort: Pulmonary effort is normal. No respiratory distress.     Breath  sounds: Normal breath sounds.  Abdominal:     General: Abdomen is flat. Bowel sounds are normal.     Palpations: Abdomen is soft.     Tenderness: There is no abdominal tenderness.  Neurological:     Mental Status: She is alert and oriented to person, place, and time.  Psychiatric:        Mood and Affect: Mood normal.        Behavior: Behavior normal.        Thought Content: Thought content normal.     Diabetic Foot Exam - Simple   Simple Foot Form Diabetic Foot exam was performed with the following findings: Yes 07/26/2020  1:07 PM  Visual Inspection No deformities, no ulcerations, no other skin breakdown bilaterally: Yes Sensation Testing Intact to touch and monofilament testing bilaterally: Yes Pulse Check Posterior Tibialis and Dorsalis pulse intact bilaterally: Yes Comments      Lab Results  Component Value Date   WBC 9.4 07/23/2020   HGB 12.6 07/23/2020   HCT 38.4 07/23/2020   PLT 236 07/23/2020   GLUCOSE 103 (H) 07/23/2020   CHOL 189 07/23/2020   TRIG 93 07/23/2020   HDL 59 07/23/2020   LDLCALC 113 (H) 07/23/2020   ALT 21 07/23/2020   AST 13 07/23/2020   NA 135 07/23/2020   K 4.0 07/23/2020   CL 94 (L) 07/23/2020   CREATININE 0.82 07/23/2020   BUN 17 07/23/2020   CO2 26 07/23/2020   TSH 3.510 01/17/2020   HGBA1C 6.1 (H) 07/23/2020   MICROALBUR 10 04/24/2020      Assessment & Plan:   1. Combined  hyperlipidemia associated with type 2 diabetes mellitus (HCC) Control: At goal. Recommend check sugars fasting daily. Recommend check feet daily. Recommend annual eye exams. Medicines: no changes. Continue to work on eating a healthy diet and exercise.   - AMB Referral to [facility] [place]  2. Mixed hyperlipidemia Recommend start zetia 10 mg once daily Recommend continue to work on eating healthy diet and exercise.  3. Benign hypertension Start on hydralazine 25 mg one twice a day.  Continue current meds. Recommend continue to work on eating healthy diet and exercise. - AMB Referral to [facility]  4. Morbid Obesity with BMI 40.0-44.9, adult ([place]) Recommend continue to work on eating healthy diet and exercise.  5. Psoriatic arthritis ([place]) The current medical regimen is effective;  continue present plan and medications.  6. Close exposure to COVID-19 virus Covid test was negative.  7. Other specified hypothyroidism The current medical regimen is effective;  continue present plan and medications.  8. Primary insomnia Continue zolpidem  I,Marla I Leal-Borjas,acting as a scribe for Rochel Brome, MD.,have documented all relevant documentation on the behalf of Rochel Brome, MD,as directed by  Rochel Brome, MD while in the presence of Rochel Brome, MD.   Meds ordered this encounter  Medications  . ezetimibe (ZETIA) 10 MG tablet    Sig: Take 1 tablet (10 mg total) by mouth daily.    Dispense:  90 tablet    Refill:  0  . DISCONTD: hydrALAZINE (APRESOLINE) 25 MG tablet    Sig: Take 1 tablet (25 mg total) by mouth in the morning and at bedtime.    Dispense:  120 tablet    Refill:  0    Orders Placed This Encounter  Procedures  . AMB Referral to [facility]     Follow-up: Return in about 3 months (around 10/26/2020) for [place] in before  appt to get labs. .  An After Visit Summary was printed and given to the patient.  Rochel Brome, MD [facility] [phone]    I,Katherina A Bramblett,acting as a scribe for Rochel Brome, MD.,have documented all relevant documentation on the behalf of Rochel Brome, MD,as directed by  Rochel Brome, MD while in the presence of Rochel Brome, MD.

## 2020-07-26 ENCOUNTER — Ambulatory Visit (INDEPENDENT_AMBULATORY_CARE_PROVIDER_SITE_OTHER): Payer: Medicare Other | Admitting: Family Medicine

## 2020-07-26 ENCOUNTER — Other Ambulatory Visit: Payer: Self-pay | Admitting: Family Medicine

## 2020-07-26 ENCOUNTER — Other Ambulatory Visit: Payer: Self-pay

## 2020-07-26 VITALS — BP 128/80 | HR 58 | Temp 96.5°F | Ht 68.0 in | Wt 278.0 lb

## 2020-07-26 DIAGNOSIS — E1169 Type 2 diabetes mellitus with other specified complication: Secondary | ICD-10-CM | POA: Diagnosis not present

## 2020-07-26 DIAGNOSIS — Z6841 Body Mass Index (BMI) 40.0 and over, adult: Secondary | ICD-10-CM | POA: Diagnosis not present

## 2020-07-26 DIAGNOSIS — I10 Essential (primary) hypertension: Secondary | ICD-10-CM | POA: Diagnosis not present

## 2020-07-26 DIAGNOSIS — F5101 Primary insomnia: Secondary | ICD-10-CM

## 2020-07-26 DIAGNOSIS — L4050 Arthropathic psoriasis, unspecified: Secondary | ICD-10-CM | POA: Diagnosis not present

## 2020-07-26 DIAGNOSIS — E038 Other specified hypothyroidism: Secondary | ICD-10-CM

## 2020-07-26 DIAGNOSIS — Z20822 Contact with and (suspected) exposure to covid-19: Secondary | ICD-10-CM | POA: Diagnosis not present

## 2020-07-26 DIAGNOSIS — E782 Mixed hyperlipidemia: Secondary | ICD-10-CM | POA: Diagnosis not present

## 2020-07-26 MED ORDER — EZETIMIBE 10 MG PO TABS: 10.0000 mg | ORAL_TABLET | Freq: Every day | ORAL | 0 refills | Status: DC

## 2020-07-26 MED ORDER — HYDRALAZINE HCL 25 MG PO TABS: 25.0000 mg | ORAL_TABLET | Freq: Two times a day (BID) | ORAL | 0 refills | Status: DC

## 2020-07-26 NOTE — Patient Instructions (Signed)
For [facility]: [facility] [address] [place],  [place]  [address]  [phone]  Hyperlipidemia: Recommend zetia 10 mg once daily.  Hypertension: Start hydralazine 25 mg one twice a day.

## 2020-07-28 ENCOUNTER — Encounter: Payer: Self-pay | Admitting: Family Medicine

## 2020-07-31 ENCOUNTER — Other Ambulatory Visit: Payer: Self-pay

## 2020-07-31 ENCOUNTER — Telehealth: Payer: Self-pay | Admitting: Family Medicine

## 2020-07-31 ENCOUNTER — Encounter: Payer: Self-pay | Admitting: Internal Medicine

## 2020-07-31 ENCOUNTER — Ambulatory Visit: Payer: Medicare Other | Admitting: Internal Medicine

## 2020-07-31 VITALS — BP 177/69 | HR 66 | Ht 68.0 in | Wt 284.0 lb

## 2020-07-31 DIAGNOSIS — L4050 Arthropathic psoriasis, unspecified: Secondary | ICD-10-CM | POA: Diagnosis not present

## 2020-07-31 NOTE — Chronic Care Management (AMB) (Signed)
  Chronic Care Management   Note  07/31/2020 Name: Whitney Eaton MRN: [id number] DOB: 1950-09-28  Whitney Eaton is a 70 y.o. year old female who is a primary care patient of Cox, Kirsten, MD. I reached out to [place] by phone today in response to a referral sent by Ms. Daphna Vandyne's PCP, Cox, Kirsten, MD.   Ms. Duquette was given information about Chronic Care Management services today including:  1. CCM service includes personalized support from designated clinical staff supervised by her physician, including individualized plan of care and coordination with other care providers 2. 24/7 contact phone numbers for assistance for urgent and routine care needs. 3. Service will only be billed when office clinical staff spend 20 minutes or more in a month to coordinate care. 4. Only one practitioner may furnish and bill the service in a calendar month. 5. The patient may stop CCM services at any time (effective at the end of the month) by phone call to the office staff.   Patient agreed to services and verbal consent obtained.   Follow up plan:   [place] Secretary/administrator

## 2020-07-31 NOTE — Progress Notes (Signed)
Office Visit Note  Patient: Whitney Eaton             Date of Birth: 11/27/1950           MRN: [id number]             PCP: Rochel Brome, MD Referring: Rochel Brome, MD Visit Date: 07/31/2020   Subjective:  Follow-up (Patient feels as if symptoms are well controlled. )   History of Present Illness: Whitney Eaton is a 70 y.o. female here for follow up for psoriatic arthritis currently on Taltz managed by her dermatologist.  Since the last visit she notices some improvement in her overall joint pain and symptoms.  She has noticed typical pattern of skin and joint disease improvement with spring and summer season compared to the winter so is not sure whether this is the cause of decreased symptoms or not.    Review of Systems  Constitutional: Negative for fatigue.  HENT: Negative for mouth sores, mouth dryness and nose dryness.   Eyes: Negative for pain, itching, visual disturbance and dryness.  Respiratory: Negative for cough, hemoptysis, shortness of breath and difficulty breathing.   Cardiovascular: Negative for chest pain, palpitations and swelling in legs/feet.  Gastrointestinal: Negative for abdominal pain, blood in stool, constipation and diarrhea.  Endocrine: Negative for increased urination.  Genitourinary: Negative for painful urination.  Musculoskeletal: Positive for arthralgias, joint pain and morning stiffness. Negative for joint swelling, myalgias, muscle weakness, muscle tenderness and myalgias.  Skin: Negative for color change, rash and redness.  Allergic/Immunologic: Negative for susceptible to infections.  Neurological: Negative for dizziness, numbness, headaches, memory loss and weakness.  Hematological: Negative for swollen glands.  Psychiatric/Behavioral: Negative for confusion and sleep disturbance.    PMFS History:  Patient Active Problem List   Diagnosis Date Noted  . Moderate persistent asthma with (acute) exacerbation 06/18/2020  . Alzheimer's disease ([place])  05/14/2020  . Left lumbar pain 05/14/2020  . Psoriatic arthritis ([place]) 01/17/2020  . 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 Primary insomnia 01/17/2020  . Other specified hypothyroidism 05/23/2019  . Vitamin D insufficiency 05/23/2019  . Benign hypertension 05/23/2019  . Diabetes mellitus type II, non insulin dependent ([place]) 05/23/2019  . Mixed hyperlipidemia 05/23/2019  . Borderline diabetes 02/07/2016  . Dyspnea on exertion 02/07/2016  . Morbid obesity ([place]) 02/07/2016  . Obstructive sleep apnea 02/07/2016    Past Medical History:  Diagnosis Date  . Diabetes ([place])   . Hyperlipemia   . Hypertension   . Psoriasis   . Sleep apnea   . Thyroid disease     Family History  Problem Relation Age of Onset  . Alzheimer's disease Mother   . Breast cancer Mother   . Arthritis Mother   . Diabetes Mother   . CAD Father   . Diabetes Father   . Parkinson's disease Father   . Breast cancer Sister   . Glaucoma Maternal Grandmother   . Heart disease Other   . Neurologic Disorder Brother    Past Surgical History:  Procedure Laterality Date  . ABDOMINAL HYSTERECTOMY    . CHOLECYSTECTOMY  2011  . HYSTERECTOMY ABDOMINAL WITH SALPINGECTOMY    . OOPHORECTOMY  1980   Social History   Social History Narrative  . Not on file   Immunization History  Administered Date(s) Administered  . Fluad Quad(high Dose 65+) 01/17/2020  . Influenza,inj,quad, With Preservative 12/19/2013, 10/29/2014, 10/31/2015  . Influenza-Unspecified 11/27/2011, 01/02/2013, 01/15/2017, 12/16/2017, 01/18/2019  . Moderna Sars-Covid-2 Vaccination 04/06/2019, 05/02/2019, 02/21/2020, 07/19/2020  .  Pneumococcal Conjugate-13 08/05/2016  . Pneumococcal Polysaccharide-23 03/16/2012  . Zoster, Live 01/21/2011     Objective: Vital Signs: BP (!) 177/69 (BP Location: Left Arm, Patient Position: Sitting, Cuff Size: Normal)   Pulse 66   Ht 5\' 8"  (1.727 m)   Wt 284 lb (128.8 kg)   BMI 43.18 kg/m    Physical  Exam Constitutional:      Appearance: She is obese.  HENT:     Right Ear: External ear normal.     Left Ear: External ear normal.  Eyes:     Conjunctiva/sclera: Conjunctivae normal.  Skin:    General: Skin is warm and dry.     Findings: No rash.  Neurological:     General: No focal deficit present.     Mental Status: She is alert.  Psychiatric:        Mood and Affect: Mood normal.      Musculoskeletal Exam:  Shoulders full ROM no tenderness or swelling Elbows full ROM no tenderness or swelling Wrists full ROM no tenderness or swelling Fingers full ROM no tenderness or swelling Knees full ROM no tenderness or swelling Ankles full ROM no tenderness or swelling   Investigation: No additional findings.  Imaging: No results found.  Recent Labs: Lab Results  Component Value Date   WBC 9.4 07/23/2020   HGB 12.6 07/23/2020   PLT 236 07/23/2020   NA 135 07/23/2020   K 4.0 07/23/2020   CL 94 (L) 07/23/2020   CO2 26 07/23/2020   GLUCOSE 103 (H) 07/23/2020   BUN 17 07/23/2020   CREATININE 0.82 07/23/2020   BILITOT 0.5 07/23/2020   ALKPHOS 76 07/23/2020   AST 13 07/23/2020   ALT 21 07/23/2020   PROT 6.4 07/23/2020   ALBUMIN 4.1 07/23/2020   CALCIUM 9.5 07/23/2020   GFRAA 83 04/22/2020    Speciality Comments: No specialty comments available.  Procedures:  No procedures performed Allergies: Crestor [rosuvastatin], Fenofibrate, Jardiance [empagliflozin], and Lipitor [atorvastatin]   Assessment / Plan:     Visit Diagnoses: Psoriatic arthritis ([place])  Psoriatic arthritis and skin disease appear well controlled today no evidence of synovitis on exam and symptoms are decreased.  Probably well controlled with Donnetta Hail currently.  Recommend follow-up in about 6 months so that we can recheck next wintertime in case symptom improvement is just seasonal related to see if it remains well controlled.  Orders: No orders of the defined types were placed in this encounter.  No  orders of the defined types were placed in this encounter.    Follow-Up Instructions: Return in about 6 months (around 01/30/2021) for PsA, OA f/u on Taltz(Rx by [facility]).   Collier Salina, MD  Note - This record has been created using [employer].  Chart creation errors have been sought, but may not always  have been located. Such creation errors do not reflect on  the standard of medical care.

## 2020-08-19 ENCOUNTER — Encounter: Payer: Self-pay | Admitting: Nurse Practitioner

## 2020-08-19 ENCOUNTER — Other Ambulatory Visit: Payer: Self-pay

## 2020-08-19 ENCOUNTER — Ambulatory Visit (INDEPENDENT_AMBULATORY_CARE_PROVIDER_SITE_OTHER): Payer: Medicare Other | Admitting: Nurse Practitioner

## 2020-08-19 VITALS — BP 128/72 | HR 72 | Temp 97.2°F | Resp 16 | Ht 68.0 in | Wt 282.0 lb

## 2020-08-19 DIAGNOSIS — B370 Candidal stomatitis: Secondary | ICD-10-CM | POA: Diagnosis not present

## 2020-08-19 DIAGNOSIS — Z23 Encounter for immunization: Secondary | ICD-10-CM

## 2020-08-19 MED ORDER — FLUCONAZOLE 150 MG PO TABS: 150.0000 mg | ORAL_TABLET | Freq: Once | ORAL | 0 refills | Status: AC

## 2020-08-19 NOTE — Progress Notes (Signed)
Acute Office Visit  Subjective:    Patient ID: Whitney Eaton, female    DOB: 12/31/1950, 70 y.o.   MRN: [id number]  CC: Sore mouth   HPI Patient is in today for thrush onset was 1-week ago. She has tried warm salt water gargles, Vaseline, and Nystatin swish and swallow mouthwash. She is using sensitive teeth toothpaste.   Past Medical History:  Diagnosis Date   Diabetes ([place])    Hyperlipemia    Hypertension    Psoriasis    Sleep apnea    Thyroid disease     Past Surgical History:  Procedure Laterality Date   ABDOMINAL HYSTERECTOMY     CHOLECYSTECTOMY  2011   HYSTERECTOMY ABDOMINAL WITH SALPINGECTOMY     OOPHORECTOMY  1980    Family History  Problem Relation Age of Onset   Alzheimer's disease Mother    Breast cancer Mother    Arthritis Mother    Diabetes Mother    CAD Father    Diabetes Father    Parkinson's disease Father    Breast cancer Sister    Glaucoma Maternal Grandmother    Heart disease Other    Neurologic Disorder Brother     Social History   Socioeconomic History   Marital status: Married    Spouse name: Not on file   Number of children: 2   Years of education: Not on file   Highest education level: Not on file  Occupational History   Occupation: homemaker  Tobacco Use   Smoking status: Never   Smokeless tobacco: Never  Vaping Use   Vaping Use: Never used  Substance and Sexual Activity   Alcohol use: Never   Drug use: Never   Sexual activity: Not on file  Other Topics Concern   Not on file  Social History Narrative   Not on file   Social Determinants of Health   Financial Resource Strain: Low Risk    Difficulty of Paying Living Expenses: Not hard at all  Food Insecurity: No Food Insecurity   Worried About Charity fundraiser in the Last Year: Never true   Baker in the Last Year: Never true  Transportation Needs: No Transportation Needs   Lack of Transportation (Medical): No   Lack of Transportation (Non-Medical): No   Physical Activity: Insufficiently Active   Days of Exercise per Week: 4 days   Minutes of Exercise per Session: 20 min  Stress: No Stress Concern Present   Feeling of Stress : Only a little  Social Connections: Engineer, building services of Communication with Friends and Family: More than three times a week   Frequency of Social Gatherings with Friends and Family: More than three times a week   Attends Religious Services: More than 4 times per year   Active Member of [employer] or Organizations: Yes   Attends Music therapist: More than 4 times per year   Marital Status: Married  Human resources officer Violence: Not At Risk   Fear of Current or Ex-Partner: No   Emotionally Abused: No   Physically Abused: No   Sexually Abused: No    Outpatient Medications Prior to Visit  Medication Sig Dispense Refill   ACCU-CHEK GUIDE test strip USE TO CHECK FASTING BLOOD SUGAR AS DIRECTED 50 strip 4   Accu-Chek Softclix Lancets lancets CHECK FASTING BLOOD SUGAR ONCE DAILY 100 each 3   albuterol (VENTOLIN HFA) 108 (90 Base) MCG/ACT inhaler INHALE 2 PUFFS INTO THE LUNGS EVERY 6  HOURS AS NEEDED FOR WHEEZING OR SHORTNESS OF BREATH 8.5 g 1   atenolol (TENORMIN) 25 MG tablet TAKE 1 TABLET(25 MG) BY MOUTH DAILY 90 tablet 1   blood glucose meter kit and supplies KIT Check sugars fasting. E11.69 (Patient taking differently: Check sugars fasting. E11.69 morning and evening) 1 each 0   Budeson-Glycopyrrol-Formoterol (BREZTRI AEROSPHERE) 160-9-4.8 MCG/ACT AERO Inhale 2 puffs into the lungs in the morning and at bedtime. 10.7 g 0   estradiol (ESTRACE) 0.1 MG/GM vaginal cream SMARTSIG:Gram(s) Vaginal 3 Times a Week     ezetimibe (ZETIA) 10 MG tablet Take 1 tablet (10 mg total) by mouth daily. (Patient not taking: Reported on 07/31/2020) 90 tablet 0   fluticasone (FLONASE) 50 MCG/ACT nasal spray Place 2 sprays into both nostrils daily. 16 g 6   hydrALAZINE (APRESOLINE) 25 MG tablet TAKE 1 TABLET(25 MG) BY  MOUTH IN THE MORNING AND AT BEDTIME 180 tablet 0   Ixekizumab (TALTZ) 80 MG/ML SOAJ Inject into the skin. Every 2 weeks. Done by Payton Mccallum.     levothyroxine (SYNTHROID) 150 MCG tablet Take 1 tablet (150 mcg total) by mouth daily. 90 tablet 1   loratadine (CLARITIN) 10 MG tablet Take 1 tablet (10 mg total) by mouth daily. 90 tablet 1   meloxicam (MOBIC) 15 MG tablet TAKE 1 TABLET(15 MG) BY MOUTH DAILY 90 tablet 1   metFORMIN (GLUCOPHAGE) 1000 MG tablet TAKE 1 TABLET(1000 MG) BY MOUTH TWICE DAILY WITH A MEAL 180 tablet 0   montelukast (SINGULAIR) 10 MG tablet Take 1 tablet (10 mg total) by mouth at bedtime. 30 tablet 11   Multiple Vitamin (MULTI-VITAMIN) tablet Take by mouth.     Omega-3 1000 MG CAPS Take by mouth.     tamsulosin (FLOMAX) 0.4 MG CAPS capsule Take 0.4 mg by mouth 2 (two) times daily.     UNABLE TO FIND Med Name: D-Mannose Powder     valsartan-hydrochlorothiazide (DIOVAN-HCT) 320-25 MG tablet TAKE 1 TABLET BY MOUTH DAILY 90 tablet 0   zolpidem (AMBIEN) 10 MG tablet Take 1 tablet (10 mg total) by mouth at bedtime. 30 tablet 3   No facility-administered medications prior to visit.    Allergies  Allergen Reactions   Crestor [Rosuvastatin]     myalgia   Fenofibrate     myalgia   Jardiance [Empagliflozin]     Nausea.   Lipitor [Atorvastatin]     myalgia    Review of Systems  Constitutional:  Negative for chills, fatigue and fever.  HENT:  Negative for congestion, rhinorrhea and sore throat.        Sore tongue and mouth   Respiratory:  Negative for cough and shortness of breath.   Cardiovascular:  Negative for chest pain.  Gastrointestinal:  Negative for abdominal pain, constipation, diarrhea, nausea and vomiting.  Genitourinary:  Negative for dysuria and urgency.  Musculoskeletal:  Negative for back pain and myalgias.  Neurological:  Negative for dizziness, weakness, light-headedness and headaches.  Psychiatric/Behavioral:  Negative for dysphoric mood. The patient is not  nervous/anxious.       Objective:    Physical Exam Vitals reviewed.  Constitutional:      Appearance: Normal appearance.  HENT:     Mouth/Throat:     Mouth: Mucous membranes are moist.     Tongue: Lesions present.     Pharynx: Posterior oropharyngeal erythema present.  Eyes:     Pupils: Pupils are equal, round, and reactive to light.  Cardiovascular:     Rate and Rhythm:  Normal rate and regular rhythm.     Pulses: Normal pulses.     Heart sounds: Normal heart sounds.  Pulmonary:     Effort: Pulmonary effort is normal.     Breath sounds: Normal breath sounds.  Abdominal:     General: Bowel sounds are normal.     Palpations: Abdomen is soft.  Musculoskeletal:     Cervical back: Neck supple.  Skin:    General: Skin is warm and dry.     Capillary Refill: Capillary refill takes less than 2 seconds.  Neurological:     General: No focal deficit present.     Mental Status: She is alert and oriented to person, place, and time.  Psychiatric:        Mood and Affect: Mood normal.        Behavior: Behavior normal.    BP 128/72   Pulse 72   Temp (!) 97.2 F (36.2 C)   Resp 16   Ht 5' 8"  (1.727 m)   Wt 282 lb (127.9 kg)   BMI 42.88 kg/m  Wt Readings from Last 3 Encounters:  08/19/20 282 lb (127.9 kg)  07/31/20 284 lb (128.8 kg)  07/26/20 278 lb (126.1 kg)    Health Maintenance Due  Topic Date Due   Hepatitis C Screening  Never done   TETANUS/TDAP  Never done   Zoster Vaccines- Shingrix (1 of 2) Never done   PNA vac Low Risk Adult (2 of 2 - PPSV23) 08/05/2017     Lab Results  Component Value Date   TSH 3.510 01/17/2020   Lab Results  Component Value Date   WBC 9.4 07/23/2020   HGB 12.6 07/23/2020   HCT 38.4 07/23/2020   MCV 91 07/23/2020   PLT 236 07/23/2020   Lab Results  Component Value Date   NA 135 07/23/2020   K 4.0 07/23/2020   CO2 26 07/23/2020   GLUCOSE 103 (H) 07/23/2020   BUN 17 07/23/2020   CREATININE 0.82 07/23/2020   BILITOT 0.5  07/23/2020   ALKPHOS 76 07/23/2020   AST 13 07/23/2020   ALT 21 07/23/2020   PROT 6.4 07/23/2020   ALBUMIN 4.1 07/23/2020   CALCIUM 9.5 07/23/2020   EGFR 77 07/23/2020   Lab Results  Component Value Date   CHOL 189 07/23/2020   Lab Results  Component Value Date   HDL 59 07/23/2020   Lab Results  Component Value Date   LDLCALC 113 (H) 07/23/2020   Lab Results  Component Value Date   TRIG 93 07/23/2020   Lab Results  Component Value Date   CHOLHDL 3.2 07/23/2020   Lab Results  Component Value Date   HGBA1C 6.1 (H) 07/23/2020       Assessment & Plan:  1. Oral thrush - fluconazole (DIFLUCAN) 150 MG tablet; Take 1 tablet (150 mg total) by mouth once for 1 dose.  Dispense: 7 tablet; Refill: 0  2. Need for shingles vaccine - Varicella-zoster vaccine subcutaneous    I spent 20 minutes dedicated to the care of this patient on the date of this encounter to include face-to-face time with the patient, as well as: EMR and prescription medication management.  Follow-up: PRN  An After Visit Summary was printed and given to the patient.  Signed, Rip Harbour, NP [place] [phone]

## 2020-08-19 NOTE — Patient Instructions (Addendum)
Recommend replace toothbrush Continue Nystatin swish Take Diflucan daily for 7 days Obtain Shingles vaccine at vaccinated B12 sublingual drops daily Follow-up as needed    Oral Thrush, Adult Oral thrush is an infection in your mouth and throat and on your tongue. Itcauses white patches to form in your mouth and on your tongue. Many cases of thrush are mild. But, sometimes, thrush can be serious. People who have a weak body defense system (immune system) or other diseases can be affected more. What are the causes? This condition is caused by a type of fungus called yeast. The fungus is normally present in small amounts in the mouth and nose. If a person has a long-term illness or a weak body defense system, the fungus can grow and spreadquickly. This causes thrush. What increases the risk? You are more likely to develop this condition if: You have a weak body defense system. You are an older adult. You have diabetes, cancer, or HIV. You have a dry mouth. You are pregnant or breastfeeding. You do not take good care of your teeth. This risk is greater for people who have false teeth (dentures). You use antibiotic or steroid medicines. What are the signs or symptoms? Symptoms of this condition include: A burning feeling in the mouth and throat. White patches that stick to the mouth and tongue. A bad taste in the mouth or trouble tasting foods. A feeling like you have cotton in your mouth. Pain when you eat and swallow. Not wanting to eat as much as usual. Cracking at the corners of the mouth. How is this treated? This condition is treated with medicines called antifungals. These medicines prevent a fungus from growing. The medicines are either put right on the area (topical) or swallowed (oral). Your doctor will also treat other problems that you may have, such as diabetesor HIV. Follow these instructions at home: Medicines Take or use over-the-counter and prescription medicines only  as told by your doctor. Ask your doctor about an over-the-counter medicine called gentian violet. Helping with pain and soreness To lessen your pain: Drink cold liquids, like water and iced tea. Eat frozen ice pops or frozen juices. Eat foods that are easy to swallow, like gelatin and ice cream. Drink from a straw if you have too much pain in your mouth.  General instructions Eat plain yogurt that has live cultures in it. Read the label to make sure that there are live cultures in your yogurt. If you wear false teeth: Take them out before you go to bed. Brush them well. Soak them in a cleaner. Rinse your mouth with warm salt-water many times a day. To make the salt-water mixture, dissolve -1 teaspoon (3-6 g) of salt in 1 cup (237 mL) of warm water. Contact a doctor if: Your problems do not get better within 7 days of treatment. Your infection is spreading. This may show as white areas on the skin outside of your mouth. You are nursing your baby and you have redness and pain in the nipples. Summary Oral thrush is an infection in your mouth and throat. It is caused by a fungus. You are more likely to get this condition if you have a weak body defense system. Diseases like diabetes, cancer, or HIV also add to your risk. This condition is treated with medicines called antifungals. Contact a doctor if you do not get better within 7 days of starting treatment. This information is not intended to replace advice given to you by your health  care provider. Make sure you discuss any questions you have with your healthcare provider. Document Revised: 12/23/2018 Document Reviewed: 12/23/2018 Elsevier Patient Education  [place]. Recombinant Zoster (Shingles) Vaccine: What You Need to Know 1. Why get vaccinated? Recombinant zoster (shingles) vaccine can prevent shingles. Shingles (also called herpes zoster, or just zoster) is a painful skin rash, usually with blisters. In addition to the  rash, shingles can cause fever, headache, chills, or upset stomach. More rarely, shingles can lead to pneumonia, hearingproblems, blindness, brain inflammation (encephalitis), or death. The most common complication of shingles is long-term nerve pain called postherpetic neuralgia (PHN). PHN occurs in the areas where the shingles rash was, even after the rash clears up. It can last for months or years after therash goes away. The pain from PHN can be severe and debilitating. About 10 to 18% of people who get shingles will experience PHN. The risk of PHN increases with age. An older adult with shingles is more likely to develop PHN and have longer lasting and more severe pain than a younger person withshingles. Shingles is caused by the varicella zoster virus, the same virus that causes chickenpox. After you have chickenpox, the virus stays in your body and can cause shingles later in life. Shingles cannot be passed from one person to another, but the virus that causes shingles can spread and cause chickenpox insomeone who had never had chickenpox or received chickenpox vaccine. 2. Recombinant shingles vaccine Recombinant shingles vaccine provides strong protection against shingles. Bypreventing shingles, recombinant shingles vaccine also protects against PHN. Recombinant shingles vaccine is the preferred vaccine for the prevention of shingles. However, a different vaccine, live shingles vaccine, may be used in somecircumstances. The recombinant shingles vaccine is recommended for adults 50 years and older without serious immune problems. It is given as a two-dose series. This vaccine is also recommended for people who have already gotten another type of shingles vaccine, the live shingles vaccine. There is no live virus inthis vaccine. Shingles vaccine may be given at the same time as other vaccines. 3. Talk with your health care provider Tell your vaccine provider if the person getting the vaccine: Has  had an allergic reaction after a previous dose of recombinant shingles vaccine, or has any severe, life-threatening allergies. Is pregnant or breastfeeding. Is currently experiencing an episode of shingles. In some cases, your health care provider may decide to postpone shinglesvaccination to a future visit. People with minor illnesses, such as a cold, may be vaccinated. People who are moderately or severely ill should usually wait until they recover beforegetting recombinant shingles vaccine. Your health care provider can give you more information. 4. Risks of a vaccine reaction A sore arm with mild or moderate pain is very common after recombinant shingles vaccine, affecting about 80% of vaccinated people. Redness and swelling can also happen at the site of the injection. Tiredness, muscle pain, headache, shivering, fever, stomach pain, and nausea happen after vaccination in more than half of people who receive recombinant shingles vaccine. In clinical trials, about 1 out of 6 people who got recombinant zoster vaccine experienced side effects that prevented them from doing regular activities.Symptoms usually went away on their own in 2 to 3 days. You should still get the second dose of recombinant zoster vaccine even if youhad one of these reactions after the first dose. People sometimes faint after medical procedures, including vaccination. Tellyour provider if you feel dizzy or have vision changes or ringing in the ears. As with any  medicine, there is a very remote chance of a vaccine causing asevere allergic reaction, other serious injury, or death. 5. What if there is a serious problem? An allergic reaction could occur after the vaccinated person leaves the clinic. If you see signs of a severe allergic reaction (hives, swelling of the face and throat, difficulty breathing, a fast heartbeat, dizziness, or weakness), call 9-1-1 and get the person to the nearest hospital. For other signs that  concern you, call your health care provider. Adverse reactions should be reported to the Vaccine Adverse Event Reporting System (VAERS). Your health care provider will usually file this report, or you can do it yourself. Visit the VAERS website at www.vaers.SamedayNews.es or call 939 314 8098. VAERS is only for reporting reactions, and VAERS staff do not give medical advice. 6. How can I learn more? Ask your health care provider. Call your local or state health department. Contact the Centers for Disease Control and Prevention (CDC): Call 484-130-4999 (1-800-CDC-INFO) or Visit CDC's website at http://hunter.com/ Vaccine Information Statement Recombinant Zoster Vaccine (12/29/2017) This information is not intended to replace advice given to you by your health care provider. Make sure you discuss any questions you have with your healthcare provider. Document Revised: 10/20/2019 Document Reviewed: 10/20/2019 Elsevier Patient Education  [place].

## 2020-08-20 ENCOUNTER — Ambulatory Visit: Payer: Medicare Other | Admitting: Nurse Practitioner

## 2020-08-23 ENCOUNTER — Other Ambulatory Visit: Payer: Self-pay | Admitting: Physician Assistant

## 2020-08-23 DIAGNOSIS — I10 Essential (primary) hypertension: Secondary | ICD-10-CM

## 2020-08-27 ENCOUNTER — Other Ambulatory Visit: Payer: Self-pay | Admitting: Family Medicine

## 2020-08-27 DIAGNOSIS — E038 Other specified hypothyroidism: Secondary | ICD-10-CM

## 2020-08-28 DIAGNOSIS — R339 Retention of urine, unspecified: Secondary | ICD-10-CM | POA: Diagnosis not present

## 2020-08-28 DIAGNOSIS — R3915 Urgency of urination: Secondary | ICD-10-CM | POA: Diagnosis not present

## 2020-09-03 ENCOUNTER — Other Ambulatory Visit: Payer: Self-pay

## 2020-09-03 ENCOUNTER — Ambulatory Visit: Payer: Medicare Other | Admitting: Allergy

## 2020-09-03 ENCOUNTER — Encounter: Payer: Self-pay | Admitting: Allergy

## 2020-09-03 VITALS — BP 126/82 | HR 62 | Resp 16 | Ht 68.0 in | Wt 285.2 lb

## 2020-09-03 DIAGNOSIS — J4550 Severe persistent asthma, uncomplicated: Secondary | ICD-10-CM

## 2020-09-03 DIAGNOSIS — J310 Chronic rhinitis: Secondary | ICD-10-CM

## 2020-09-03 MED ORDER — SPACER/AERO-HOLDING CHAMBERS DEVI: 0 refills | Status: DC

## 2020-09-03 NOTE — Progress Notes (Signed)
New Patient Note  RE: Whitney Eaton MRN: [id number] DOB: Aug 22, 1950 Date of Office Visit: 09/03/2020  Referring provider: Rip Harbour, NP Primary care provider: Rochel Brome, MD  Chief Complaint: allergies  History of present illness: Whitney Eaton is a 70 y.o. female presenting today for evaluation of allergic rhinitis.    She states she got PNA this spring after taking care of grandson who was sick first and she states her husband also developed PNA.  She had CXR to confirm and CXR follow-up that showed it resolved.  She states when she first developed symptoms she saw her PCP who prescribed z-pak which didn't help.  She later went to [facility] and was prescribed amoxicillin and "something else" that "did the trick".  She also states she received steroid injection and prednisone course with all of this.  She states by the middle of May PNA had resolved.   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   She has history of asthma.  She is on Breztri 2 puffs twice a day now.  She states breztri was started during her course of PNA.  Prior to to that she repots having just albuterol inhaler. She uses albuterol 2 puffs twice a day as well as states was told to do so with the breast tree while she was sick with pneumonia.    She would like to know what she is allergic to.  She states she initially thought the pneumonia symptoms are related to allergies especially pollens as she states the tree pollen season was terribly bad.  Her allergy symptoms are usually worse in the spring and fall.  She does note congestion which she states Flonase helps with that she uses daily.  She also takes loratadine and Singulair daily.  She also states she was taking a natural product for allergies that she is no longer taking.  [place] around [place].  She does a lot of gardening/yardwork.  That she states she has  a lot of potential allergen exposures.  She denies history of food allergy.   Review of systems: Review of Systems  Constitutional: Negative.   HENT:  Positive for congestion.   Eyes: Negative.   Respiratory: Negative.    Cardiovascular: Negative.   Gastrointestinal: Negative.   Musculoskeletal: Negative.   Skin: Negative.   Neurological: Negative.    All other systems negative unless noted above in HPI  Past medical history: Past Medical History:  Diagnosis Date   Asthma    Diabetes ([place])    Hyperlipemia    Hypertension    Psoriasis    Sleep apnea    Thyroid disease     Past surgical history: Past Surgical History:  Procedure Laterality Date   ABDOMINAL HYSTERECTOMY     CHOLECYSTECTOMY  2011   HYSTERECTOMY ABDOMINAL WITH SALPINGECTOMY     OOPHORECTOMY  1980    Family history:  Family History  Problem Relation Age of Onset   Alzheimer's disease Mother    Breast cancer Mother    Arthritis Mother    Diabetes Mother    Eczema Father    CAD Father    Diabetes Father    Parkinson's disease Father    Breast cancer Sister    Neurologic Disorder Brother    Glaucoma Maternal Grandmother    Heart disease Other     Social  history: She lives in a home with carpeting with heat pump heating and cooling.  No pets in the home.  There is no concern for water damage, mildew or roaches in the home.  She is a Agricultural engineer.  She denies a smoking history.   Medication List: Current Outpatient Medications  Medication Sig Dispense Refill   ACCU-CHEK GUIDE test strip USE TO CHECK FASTING BLOOD SUGAR AS DIRECTED 50 strip 4   Accu-Chek Softclix Lancets lancets CHECK FASTING BLOOD SUGAR ONCE DAILY 100 each 3   albuterol (VENTOLIN HFA) 108 (90 Base) MCG/ACT inhaler INHALE 2 PUFFS INTO THE LUNGS EVERY 6 HOURS AS NEEDED FOR WHEEZING OR SHORTNESS OF BREATH 8.5 g 1   atenolol (TENORMIN) 25 MG tablet TAKE 1 TABLET(25 MG) BY MOUTH DAILY 90 tablet 1   blood glucose meter kit and supplies  KIT Check sugars fasting. E11.69 (Patient taking differently: Check sugars fasting. E11.69 morning and evening) 1 each 0   Budeson-Glycopyrrol-Formoterol (BREZTRI AEROSPHERE) 160-9-4.8 MCG/ACT AERO Inhale 2 puffs into the lungs in the morning and at bedtime. 10.7 g 0   estradiol (ESTRACE) 0.1 MG/GM vaginal cream SMARTSIG:Gram(s) Vaginal 3 Times a Week     fluticasone (FLONASE) 50 MCG/ACT nasal spray Place 2 sprays into both nostrils daily. 16 g 6   hydrALAZINE (APRESOLINE) 25 MG tablet TAKE 1 TABLET(25 MG) BY MOUTH IN THE MORNING AND AT BEDTIME 180 tablet 0   Ixekizumab (TALTZ) 80 MG/ML SOAJ Inject into the skin. Every 2 weeks. Done by Payton Mccallum.     levothyroxine (SYNTHROID) 150 MCG tablet Take 1 tablet (150 mcg total) by mouth daily. 90 tablet 1   loratadine (CLARITIN) 10 MG tablet Take 1 tablet (10 mg total) by mouth daily. 90 tablet 1   meloxicam (MOBIC) 15 MG tablet TAKE 1 TABLET(15 MG) BY MOUTH DAILY 90 tablet 1   metFORMIN (GLUCOPHAGE) 1000 MG tablet TAKE 1 TABLET(1000 MG) BY MOUTH TWICE DAILY WITH A MEAL 180 tablet 0   montelukast (SINGULAIR) 10 MG tablet Take 1 tablet (10 mg total) by mouth at bedtime. 30 tablet 11   Multiple Vitamin (MULTI-VITAMIN) tablet Take by mouth.     Omega-3 1000 MG CAPS Take by mouth.     Spacer/Aero-Holding Dorise Bullion Use as directed with inhaler 1 each 0   tamsulosin (FLOMAX) 0.4 MG CAPS capsule Take 0.4 mg by mouth 2 (two) times daily.     UNABLE TO FIND Med Name: D-Mannose Powder     valsartan-hydrochlorothiazide (DIOVAN-HCT) 320-25 MG tablet TAKE 1 TABLET BY MOUTH DAILY 90 tablet 0   zolpidem (AMBIEN) 10 MG tablet TAKE 1 TABLET(10 MG) BY MOUTH AT BEDTIME 90 tablet 1   No current facility-administered medications for this visit.    Known medication allergies: Allergies  Allergen Reactions   Crestor [Rosuvastatin]     myalgia   Fenofibrate     myalgia   Jardiance [Empagliflozin]     Nausea.   Lipitor [Atorvastatin]     myalgia     Physical  examination: Blood pressure 126/82, pulse 62, resp. rate 16, height 5' 8"  (1.727 m), weight 285 lb 3.2 oz (129.4 kg), SpO2 97 %.  General: Alert, interactive, in no acute distress. HEENT: PERRLA, TMs pearly gray, turbinates mildly edematous without discharge, post-pharynx non erythematous. Neck: Supple without lymphadenopathy. Lungs: Mildly decreased breath sounds bilaterally without wheezing, rhonchi or rales. {no increased work of breathing. CV: Normal S1, S2 without murmurs. Abdomen: Nondistended, nontender. Skin: Warm and dry, without lesions or rashes. Extremities:  No clubbing, cyanosis or edema. Neuro:   Grossly intact.  Diagnositics/Labs:  Spirometry: FEV1: 1.23 L 49%, FVC: 1.45 L 44% predicted.  Status post DuoNeb she had a 2% improvement in FEV1 to 1.26 L or 50% which is not a significant response  Allergy skin testing deferred today due to decreased lung function and patient preference.  Assessment and plan: Asthma  -lung function testing is low and did improve with albuterol use but not significant improvement -continue Breztri 2 puffs twice a day -for this week decrease albuterol to 2 puffs once a day then stop and go to as needed use.   -have access to albuterol inhaler 2 puffs every 4-6 hours as needed for cough/wheeze/shortness of breath/chest tightness.  May use 15-20 minutes prior to activity.   Monitor frequency of use.   -use pump inhaler with spacer device -continue Singulair 68m daily at bedtime  Asthma control goals:  Full participation in all desired activities (may need albuterol before activity) Albuterol use two time or less a week on average (not counting use with activity) Cough interfering with sleep two time or less a month Oral steroids no more than once a year No hospitalizations  Rhinitis, presumed allergic -will obtain environmental allergy panel via blood work.   - continue Flonase 2 sprays each nostril daily for 1-2 weeks at a time before  stopping once nasal congestion improves for maximum benefit -would change Loratadine to either Zyrtec 116m Xyzal 85m60mr Allegra 180m77mily as needed.   These tend to be more effective than Loratadine for allergy symptom control -once we get your allergy test results recommendations may change  Follow-up in 3 months or sooner if needed   I appreciate the opportunity to take part in Tahjanae's care. Please do not hesitate to contact me with questions.  Sincerely,   ShayPrudy Feeler [facility]

## 2020-09-03 NOTE — Patient Instructions (Addendum)
-  lung function testing is low and did improve with albuterol use but not significant improvement -continue Breztri 2 puffs twice a day -for this week decrease albuterol to 2 puffs once a day then stop and go to as needed use.   -have access to albuterol inhaler 2 puffs every 4-6 hours as needed for cough/wheeze/shortness of breath/chest tightness.  May use 15-20 minutes prior to activity.   Monitor frequency of use.   -use pump inhaler with spacer device -continue Singulair 10mg  daily at bedtime  Asthma control goals:  Full participation in all desired activities (may need albuterol before activity) Albuterol use two time or less a week on average (not counting use with activity) Cough interfering with sleep two time or less a month Oral steroids no more than once a year No hospitalizations  -will obtain environmental allergy panel via blood work.   - continue Flonase 2 sprays each nostril daily for 1-2 weeks at a time before stopping once nasal congestion improves for maximum benefit -would change Loratadine to either Zyrtec 10mg , Xyzal 5mg  or Allegra 180mg  daily as needed.   These tend to be more effective than Loratadine for allergy symptom control -once we get your allergy test results recommendations may change  Follow-up in 3 months or sooner if needed   **We are ordering labs, so please allow 1-2 weeks for the results to come back.  With the newly implemented Cures Act, the labs might be visible to you at the same time that they become visible to me.  However, I will not address the results until all of the results come  back, so please be patient.  In the meantime, continue avoiding your triggering food(s) in your After Visit Summary, including avoidance measures (if applicable), until you hear from me about the results.

## 2020-09-04 DIAGNOSIS — E119 Type 2 diabetes mellitus without complications: Secondary | ICD-10-CM | POA: Diagnosis not present

## 2020-09-04 DIAGNOSIS — H40019 Open angle with borderline findings, low risk, unspecified eye: Secondary | ICD-10-CM | POA: Diagnosis not present

## 2020-09-04 DIAGNOSIS — H2510 Age-related nuclear cataract, unspecified eye: Secondary | ICD-10-CM | POA: Diagnosis not present

## 2020-09-04 DIAGNOSIS — H35372 Puckering of macula, left eye: Secondary | ICD-10-CM | POA: Diagnosis not present

## 2020-09-04 LAB — HM DIABETES EYE EXAM

## 2020-09-06 ENCOUNTER — Encounter: Payer: Self-pay | Admitting: Family Medicine

## 2020-09-07 LAB — ALLERGENS W/TOTAL IGE AREA 2

## 2020-09-09 ENCOUNTER — Encounter: Payer: Self-pay | Admitting: Family Medicine

## 2020-09-10 ENCOUNTER — Other Ambulatory Visit: Payer: Self-pay

## 2020-09-10 MED ORDER — ACCU-CHEK GUIDE VI STRP: ORAL_STRIP | 4 refills | Status: DC

## 2020-09-10 MED ORDER — ACCU-CHEK SOFTCLIX LANCETS MISC: 3 refills | Status: DC

## 2020-09-17 ENCOUNTER — Other Ambulatory Visit: Payer: Self-pay | Admitting: Physician Assistant

## 2020-09-19 ENCOUNTER — Telehealth: Payer: Self-pay

## 2020-09-19 NOTE — Progress Notes (Signed)
Chronic Care Management Pharmacy Assistant   Name: Whitney Eaton  MRN: [id number] DOB: 1950-09-01  Whitney Eaton is an 70 y.o. year old female who presents for his initial CCM visit with the clinical pharmacist.   Conditions to be addressed/monitored: HTN, HLD, and DMII  Recent office visits:  08/19/20-[name] Montez Morita NP (PCP), thrush, Fluconazole started, follow up as needed.   07/26/20-Dr Cox PCP, hypertension, started Zetia and Hydralazine, referral made to [facility] care coordination, follow up 3 months  07/12/20-[name] Montez Morita NP (PCP), chest congestion, started [place], referral to allergy, stopped Fluconazole, follow up as needed  07/04/20-Dr Cox PCP, thrush, started Fluconazole and Nystatin, follow up PRN  06/25/20-Dr Cox PCP, pneumonia, started [place], follow up one month  06/18/20-Dr Cox PCP, cough, x ray and covid test ordered, Started Ventolin, Breztri and Prednisone, was given nebulizer treatment, Kenalog and Rocephin injections, return in one week   06/11/20-[name] Montez Morita NP (PCP), video visit for cough,  started on Zpac and Flonase and Loratadine, follow up PRN,   05/14/20-Dr Henrene Pastor PCP, back pain, started Tramadol, proved discontinued  bethanechol, Kenalog injection was given, follow up one week.   04/22/20-Dr Cox PCP, hyperlipidemia, labs ordered, started on bethanechol. Follow up 3 months Lab results: Blood count normal.  Liver function normal Kidney function normal Lipid panel abnormal, but greatly improved. LDL 111. Trigs 166. HDL stable.Recommend zetia 10 mg once daily. Recommend continue fish oil.  Diabetes well controlled. HbA1C - 6.5. Vitamin D good. May change to vitamin D 2500 mg daily.   Recent consult visits:  08/31/20-[facility], allergic rhinitis, patient reported not taking Zetia, Spirometry ordered, provider stated continue Breztri 2 puffs twice a day -for this week decrease albuterol to 2 puffs once a day then stop and go to as  needed use.  Follow up 3 months   07/31/20-Dr Rice, Rheumatology, no medication changes noted, return in 6 months  06/25/20-Dr Rice, Rheumatology, new patient exam, not medication changes, return in one month  05/27/20-Mammogram  05/23/20-[facility], Urology, urine retention, no other notes available  05/13/20-Darin Denamur-Chiropractor  05/09/20-Darin Denamur-Chiropractor  05/06/20-Darin Denamur-Chiropractor  04/25/20-Roberto Nila Nephew, Urology, urine retention, no other notes available  Hospital visits:  None in previous 6 months  Medications: Outpatient Encounter Medications as of 09/19/2020  Medication Sig   Accu-Chek Softclix Lancets lancets CHECK FASTING BLOOD SUGAR ONCE DAILY   albuterol (VENTOLIN HFA) 108 (90 Base) MCG/ACT inhaler INHALE 2 PUFFS INTO THE LUNGS EVERY 6 HOURS AS NEEDED FOR WHEEZING OR SHORTNESS OF BREATH   atenolol (TENORMIN) 25 MG tablet TAKE 1 TABLET(25 MG) BY MOUTH DAILY   blood glucose meter kit and supplies KIT Check sugars fasting. E11.69 (Patient taking differently: Check sugars fasting. E11.69 morning and evening)   Budeson-Glycopyrrol-Formoterol (BREZTRI AEROSPHERE) 160-9-4.8 MCG/ACT AERO Inhale 2 puffs into the lungs in the morning and at bedtime.   estradiol (ESTRACE) 0.1 MG/GM vaginal cream SMARTSIG:Gram(s) Vaginal 3 Times a Week   fluticasone (FLONASE) 50 MCG/ACT nasal spray Place 2 sprays into both nostrils daily.   glucose blood (ACCU-CHEK GUIDE) test strip USE TO CHECK FASTING BLOOD SUGAR AS DIRECTED   hydrALAZINE (APRESOLINE) 25 MG tablet TAKE 1 TABLET(25 MG) BY MOUTH IN THE MORNING AND AT BEDTIME   Ixekizumab (TALTZ) 80 MG/ML SOAJ Inject into the skin. Every 2 weeks. Done by Payton Mccallum.   levothyroxine (SYNTHROID) 150 MCG tablet Take 1 tablet (150 mcg total) by mouth daily.   loratadine (CLARITIN) 10 MG tablet Take 1 tablet (10 mg total)  by mouth daily.   meloxicam (MOBIC) 15 MG tablet TAKE 1 TABLET(15 MG) BY MOUTH DAILY   metFORMIN (GLUCOPHAGE) 1000 MG  tablet TAKE 1 TABLET(1000 MG) BY MOUTH TWICE DAILY WITH A MEAL   montelukast (SINGULAIR) 10 MG tablet Take 1 tablet (10 mg total) by mouth at bedtime.   Multiple Vitamin (MULTI-VITAMIN) tablet Take by mouth.   Omega-3 1000 MG CAPS Take by mouth.   Spacer/Aero-Holding Dorise Bullion Use as directed with inhaler   tamsulosin (FLOMAX) 0.4 MG CAPS capsule Take 0.4 mg by mouth 2 (two) times daily.   UNABLE TO FIND Med Name: D-Mannose Powder   valsartan-hydrochlorothiazide (DIOVAN-HCT) 320-25 MG tablet TAKE 1 TABLET BY MOUTH DAILY   zolpidem (AMBIEN) 10 MG tablet TAKE 1 TABLET(10 MG) BY MOUTH AT BEDTIME   No facility-administered encounter medications on file as of 09/19/2020.    Lab Results  Component Value Date/Time   HGBA1C 6.1 (H) 07/23/2020 02:44 PM   HGBA1C 6.5 (H) 04/22/2020 12:00 PM   MICROALBUR 10 04/24/2020 06:52 PM   MICROALBUR 10 09/25/2019 11:23 AM     BP Readings from Last 3 Encounters:  09/03/20 126/82  08/19/20 128/72  07/31/20 (!) 177/69      Have you seen any other providers since your last visit with PCP? No  Any changes in your medications or health? Yes, she recently had covid and was on inhalers and other covid medications.    Any side effects from any medications? No  Do you have an symptoms or problems not managed by your medications? No  Any concerns about your health right now? No  Has your provider asked that you check blood pressure, blood sugar, or follow special diet at home? Yes, patient stated she does check her blood pressure once a day and her blood sugar twice a day.  She stated she watches her diet.    Do you get any type of exercise on a regular basis? Yes, Patient stated she works in the yard and in her garden.   Can you think of a goal you would like to reach for your health? Yes, patient would like to increase her endurence and lose some weight.   Do you have any problems getting your medications? No  Is there anything that you would like  to discuss during the appointment? No     Star Rating Drugs:  Medication:  Last Fill: Day Supply Levothyroxine  06/21/20 90 Valsartan/HCTZ 08/23/20 90 Zolpidem  08/27/20 90 Metformin  06/21/20 90   Care Gaps: Last annual wellness visit?  10/10/15 If applicable: Last eye exam / retinopathy screening?  08/23/19 Last diabetic foot exam?  07/26/20  Clarita Leber, [place] Pharmacist Assistant [phone]

## 2020-09-19 NOTE — Progress Notes (Signed)
Chronic Care Management Pharmacy Note  09/27/2020 Name:  Whitney Eaton MRN:  [id number] DOB:  1950-11-26  Summary: Patient is diligently with lifestyle modification in diet and exercise. She is not willing to start Zetia at this time. Reports that her cholesterol was improved and she hopes it will be even better with next results.  Patient's blood sugar remains well controlled at home.  Patient's Whitney Eaton is $47 each month but not eligible for patient assistance at this time. Pharmacist will coordinate at any point in the future if needed and eligible.  Patient would like to reduce or discontinue zolpidem. She is currently taking 1/2 tablet at night discussed option of continuing to cut in half and use as needed or every other day to work on discontinuing.    Subjective: Whitney Eaton is an 70 y.o. year old female who is a primary patient of Cox, Kirsten, MD.  The CCM team was consulted for assistance with disease management and care coordination needs.    Engaged with patient by telephone for initial visit in response to provider referral for pharmacy case management and/or care coordination services.   Consent to Services:  The patient was given the following information about Chronic Care Management services today, agreed to services, and gave verbal consent: 1. CCM service includes personalized support from designated clinical staff supervised by the primary care provider, including individualized plan of care and coordination with other care providers 2. 24/7 contact phone numbers for assistance for urgent and routine care needs. 3. Service will only be billed when office clinical staff spend 20 minutes or more in a month to coordinate care. 4. Only one practitioner may furnish and bill the service in a calendar month. 5.The patient may stop CCM services at any time (effective at the end of the month) by phone call to the office staff. 6. The patient will be responsible for cost sharing (co-pay)  of up to 20% of the service fee (after annual deductible is met). Patient agreed to services and consent obtained.  Patient Care Team: Rochel Brome, MD as PCP - General (Family Medicine) Eaton, Ancil Boozer, [place] as Referring Physician (Chiropractic Medicine) Schuyler Amor, PA-C (Dermatology) Phylliss Blakes, OD (Optometry) Sherlyn Lees, [place] as Nurse Practitioner (Urology) Burnice Logan, [facility] as Pharmacist (Pharmacist)   Recent office visits:  08/19/20-[name] Heaton NP (PCP), thrush, Fluconazole started, follow up as needed.   07/26/20-Dr Cox PCP, hypertension, started Zetia and Hydralazine, referral [facility] to [facility] care coordination, follow up 3 months   07/12/20-[name] Montez Morita NP (PCP), chest congestion, started [place], referral to allergy, stopped Fluconazole, follow up as needed   07/04/20-Dr Cox PCP, thrush, started Fluconazole and Nystatin, follow up PRN   06/25/20-Dr Cox PCP, pneumonia, started [place], follow up one month   06/18/20-Dr Cox PCP, cough, x ray and covid test ordered, Started Ventolin, Breztri and Prednisone, was given nebulizer treatment, Kenalog and Rocephin injections, return in one week   06/11/20-[name] Montez Morita NP (PCP), video visit for cough,  started on Zpac and Flonase and Loratadine, follow up PRN,   05/14/20-Dr Henrene Pastor PCP, back pain, started Tramadol, proved discontinued  bethanechol, Kenalog injection was given, follow up one week.   04/22/20-Dr Cox PCP, hyperlipidemia, labs ordered, started on bethanechol. Follow up 3 months Lab results: Blood count normal. Liver function normal Kidney function normal Lipid panel abnormal, but greatly improved. LDL 111. Trigs 166. HDL stable.Recommend zetia 10 mg once daily. Recommend continue fish oil. Diabetes well controlled. HbA1C - 6.5. Vitamin D good.  May change to vitamin D 2500 mg daily.    Recent consult visits:  08/31/20-[facility], allergic [facility]initis, patient reported not taking Zetia,  Spirometry ordered, provider stated continue Breztri 2 puffs twice a day -for this week decrease albuterol to 2 puffs once a day then stop and go to as needed use.  Follow up 3 months   07/31/20-Dr Rice, Rheumatology, no medication changes noted, return in 6 months   06/25/20-Dr Rice, Rheumatology, new patient exam, not medication changes, return in one month   05/27/20-Mammogram   05/23/20-[facility] E[facility], Urology, urine retention, no other notes available   05/13/20-Whitney Eaton-Chiropractor   05/09/20-Whitney Eaton-Chiropractor   05/06/20-Whitney Eaton-Chiropractor   04/25/20-Whitney Whitney Eaton, Urology, urine retention, no other notes available   Hospital visits:  None in previous 6 months   Objective:  Lab Results  Component Value Date   CREATININE 0.82 07/23/2020   BUN 17 07/23/2020   GFRNONAA 72 04/22/2020   GFRAA 83 04/22/2020   NA 135 07/23/2020   K 4.0 07/23/2020   CALCIUM 9.5 07/23/2020   CO2 26 07/23/2020   GLUCOSE 103 (H) 07/23/2020    Lab Results  Component Value Date/Time   HGBA1C 6.1 (H) 07/23/2020 02:44 PM   HGBA1C 6.5 (H) 04/22/2020 12:00 PM   MICROALBUR 10 04/24/2020 06:52 PM   MICROALBUR 10 09/25/2019 11:23 AM    Last diabetic Eye exam:  Lab Results  Component Value Date/Time   HMDIABEYEEXA No Retinopathy 08/23/2019 12:00 AM    Last diabetic Foot exam: No results found for: HMDIABFOOTEX   Lab Results  Component Value Date   CHOL 189 07/23/2020   HDL 59 07/23/2020   LDLCALC 113 (H) 07/23/2020   TRIG 93 07/23/2020   CHOLHDL 3.2 07/23/2020    Hepatic Function Latest Ref Rng & Units 07/23/2020 04/22/2020 01/17/2020  Total Protein 6.0 - 8.5 g/dL 6.4 7.2 7.2  Albumin 3.8 - 4.8 g/dL 4.1 4.2 4.2  AST 0 - 40 IU/L 13 23 24   ALT 0 - 32 IU/L 21 24 25   Alk Phosphatase 44 - 121 IU/L 76 86 82  Total Bilirubin 0.0 - 1.2 mg/dL 0.5 0.6 0.6    Lab Results  Component Value Date/Time   TSH 3.510 01/17/2020 12:11 PM   TSH 0.592 07/20/2019 02:05 PM    CBC Latest  Ref Rng & Units 07/23/2020 04/22/2020 01/17/2020  WBC 3.4 - 10.8 x10E3/uL 9.4 7.5 9.6  Hemoglobin 11.1 - 15.9 g/dL 12.6 13.1 13.5  Hematocrit 34.0 - 46.6 % 38.4 39.9 40.4  Platelets 150 - 450 x10E3/uL 236 206 231    Lab Results  Component Value Date/Time   VD25OH 47.4 04/22/2020 12:00 PM   VD25OH 48.5 05/22/2019 10:59 AM    Clinical ASCVD: No  The 10-year ASCVD risk score Mikey Bussing DC Jr., et al., 2013) is: 19.6%   Values used to calculate the score:     Age: 70 years     Sex: Female     Is Non-Hispanic African American: No     Diabetic: Yes     Tobacco smoker: No     Systolic Blood Pressure: 675 mmHg     Is BP treated: Yes     HDL Cholesterol: 59 mg/dL     Total Cholesterol: 189 mg/dL    Depression screen [facility] 2/9 09/26/2020 06/04/2020 09/25/2019  Decreased Interest 0 0 0  Down, Depressed, Hopeless 0 0 0  PHQ - 2 Score 0 0 0     Other: (CHADS2VASc  if Afib, MMRC or CAT for COPD, ACT, DEXA)  Social History   Tobacco Use  Smoking Status Never  Smokeless Tobacco Never   BP Readings from Last 3 Encounters:  09/03/20 126/82  08/19/20 128/72  07/31/20 (!) 177/69   Pulse Readings from Last 3 Encounters:  09/03/20 62  08/19/20 72  07/31/20 66   Wt Readings from Last 3 Encounters:  09/03/20 285 lb 3.2 oz (129.4 kg)  08/19/20 282 lb (127.9 kg)  07/31/20 284 lb (128.8 kg)   BMI Readings from Last 3 Encounters:  09/03/20 43.36 kg/m  08/19/20 42.88 kg/m  07/31/20 43.18 kg/m    Assessment/Interventions: Review of patient past medical history, allergies, medications, health status, including review of consultants reports, laboratory and other test data, was performed as part of comprehensive evaluation and provision of chronic care management services.   SDOH:  (Social Determinants of Health) assessments and interventions performed: Yes  SDOH Screenings   Alcohol Screen: Not on file  Depression (PHQ2-9): Low Risk    PHQ-2 Score: 0  Financial Resource Strain: Low Risk     Difficulty of Paying Living Expenses: Not hard at all  Food Insecurity: No Food Insecurity   Worried About Charity fundraiser in the Last Year: Never true   Ran Out of Food in the Last Year: Never true  Housing: Low Risk    Last Housing Risk Score: 0  Physical Activity: Insufficiently Active   Days of Exercise per Week: 4 days   Minutes of Exercise per Session: 20 min  Social Connections: Engineer, building services of Communication with Friends and Family: More than three times a week   Frequency of Social Gatherings with Friends and Family: More than three times a week   Attends Religious Services: More than 4 times per year   Active Member of [employer] or Organizations: Yes   Attends Music therapist: More than 4 times per year   Marital Status: Married  Stress: No Stress Concern Present   Feeling of Stress : Only a little  Tobacco Use: Low Risk    Smoking Tobacco Use: Never   Smokeless Tobacco Use: Never  Transportation Needs: No Transportation Needs   Lack of Transportation (Medical): No   Lack of Transportation (Non-Medical): No    CCM Care Plan  Allergies  Allergen Reactions   Crestor [Rosuvastatin]     myalgia   Fenofibrate     myalgia   Jardiance [Empagliflozin]     Nausea.   Lipitor [Atorvastatin]     myalgia    Medications Reviewed Today     Reviewed by Burnice Logan, [facility] (Pharmacist) on 09/26/20 at 1129  Med List Status: <None>   Medication Order Taking? Sig Documenting Provider Last Dose Status Informant  Accu-Chek Softclix Lancets lancets [id number] Yes CHECK FASTING BLOOD SUGAR ONCE DAILY Cox, Kirsten, MD Taking Active   albuterol (VENTOLIN HFA) 108 (90 Base) MCG/ACT inhaler [id number] Yes INHALE 2 PUFFS INTO THE LUNGS EVERY 6 HOURS AS NEEDED FOR WHEEZING OR SHORTNESS OF Steve Rattler, PA-C Taking Active   atenolol (TENORMIN) 25 MG tablet [id number] Yes TAKE 1 TABLET(25 MG) BY MOUTH DAILY Cox, Kirsten, MD Taking Active   blood glucose  meter kit and supplies KIT [id number] No Check sugars fasting. E11.69  Patient not taking: Reported on 09/26/2020   CoxElnita Maxwell, MD Not Taking Active   Budeson-Glycopyrrol-Formoterol [facility] AEROSPHERE) 160-9-4.8 MCG/ACT AERO [id number] Yes Inhale 2 puffs into the lungs in the morning and  at bedtime. Cox, Kirsten, MD Taking Active   Cyanocobalamin (VITAMIN B 12 PO) [id number] Yes Take 5,000 mcg by mouth daily. B-12 sublingual drops [provider] Taking Active   estradiol (ESTRACE) 0.1 MG/GM vaginal cream [id number] Yes Monday/Wednesday/Friday in the evening [provider] Taking Active   fluticasone (FLONASE) 50 MCG/ACT nasal spray [id number] Yes Place 2 sprays into both nostrils daily. Rip Harbour, NP Taking Active   glucose blood (ACCU-CHEK GUIDE) test strip [id number] Yes USE TO CHECK FASTING BLOOD SUGAR AS DIRECTED Cox, Kirsten, MD Taking Active   hydrALAZINE (APRESOLINE) 25 MG tablet [id number] Yes TAKE 1 TABLET(25 MG) BY MOUTH IN THE MORNING AND AT BEDTIME Marge Duncans, PA-C Taking Active   Ixekizumab (TALTZ) 80 MG/ML SOAJ [id number] Yes Inject into the skin. Every 2 weeks. Done by Payton Mccallum. [provider] Taking Active Self  levothyroxine (SYNTHROID) 150 MCG tablet [id number] Yes Take 1 tablet (150 mcg total) by mouth daily. Rochel Brome, MD Taking Active   metFORMIN (GLUCOPHAGE) 1000 MG tablet [id number] Yes TAKE 1 TABLET(1000 MG) BY MOUTH TWICE DAILY WITH A MEAL Marge Duncans, PA-C Taking Active   montelukast (SINGULAIR) 10 MG tablet [id number] Yes Take 1 tablet (10 mg total) by mouth at bedtime. Cox, Kirsten, MD Taking Active   Multiple Vitamins-Minerals (SENIOR MULTIVITAMIN PLUS PO) [id number] Yes Take 1 tablet by mouth daily. [provider] Taking Active   Omega-3 1000 MG CAPS [id number] Yes Take 1 capsule by mouth daily. [provider] Taking Active   OVER THE [place] [id number] Yes Take 1 capsule by mouth 2 (two) times daily. Vitamin C (from  Lipid Metabolite Ascorbate) + Bacillus subtillis 900/200 [provider] Taking Active   OVER THE COUNTER MEDICATION [id number] Yes Take 1 tablet by mouth daily. Zinc and Bacillus Subtillis 20/20 mg daily [provider] Taking Active   Spacer/Aero-Holding Dorise Bullion [id number]  Use as directed with inhaler Kennith Gain, MD  Active   tamsulosin [facility]) 0.4 MG CAPS capsule [id number] Yes Take 0.4 mg by mouth 2 (two) times daily. [provider] Taking Active   UNABLE TO FIND [id number] Yes Med Name: D-Mannose Powder - 1 teaspoonful in cup of water [provider] Taking Active   valsartan-hydrochlorothiazide (DIOVAN-HCT) 320-25 MG tablet [id number] Yes TAKE 1 TABLET BY MOUTH DAILY  Patient taking differently: Take 1 tablet by mouth in the morning.   Marge Duncans, PA-C Taking Active   VITAMIN D-VITAMIN K PO [id number] Yes Take 1 tablet by mouth daily. [provider] Taking Active   zolpidem (AMBIEN) 10 MG tablet [id number] Yes TAKE 1 TABLET(10 MG) BY MOUTH AT BEDTIME  Patient taking differently: Take 10 mg by mouth at bedtime.   Rochel Brome, MD Taking Active             Patient Active Problem List   Diagnosis Date Noted   Moderate persistent asthma with (acute) exacerbation 06/18/2020   Alzheimer's disease ([place]) 05/14/2020   Left lumbar pain 05/14/2020   Psoriatic arthritis ([place]) 01/17/2020   Drug-induced myopathy 01/17/2020   Primary insomnia 01/17/2020   Other specified hypothyroidism 05/23/2019   Vitamin D insufficiency 05/23/2019   Benign hypertension 05/23/2019   Diabetes mellitus type II, non insulin dependent ([place]) 05/23/2019   Mixed hyperlipidemia 05/23/2019   Borderline diabetes 02/07/2016   Dyspnea on exertion 02/07/2016   Morbid obesity ([place]) 02/07/2016   Obstructive sleep apnea 02/07/2016    Immunization History  Administered Date(s) Administered   Fluad Quad(high Dose 65+) 01/17/2020  Influenza,inj,quad, With  Preservative 12/19/2013, 10/29/2014, 10/31/2015   Influenza-Unspecified 11/27/2011, 01/02/2013, 01/15/2017, 12/16/2017, 01/18/2019   Moderna Sars-Covid-2 Vaccination 04/06/2019, 05/02/2019, 02/21/2020, 07/19/2020   Pneumococcal Conjugate-13 08/05/2016   Pneumococcal Polysaccharide-23 03/16/2012   Zoster, Live 01/21/2011    Conditions to be addressed/monitored:  Hypertension, Hyperlipidemia, Diabetes, alzheimer's disease, psoriatic arthritis, vitamin d deficiency and obstructive sleep apnea.   Care Plan : [place]  Updates made by Burnice Logan, RPH since 09/27/2020 12:00 AM     Problem: dm, hld, htn, hypothyroidism   Priority: High  Onset Date: 09/26/2020     Long-Range Goal: Disease State Management   Start Date: 09/26/2020  Expected End Date: 09/26/2021  This Visit's Progress: On track  Priority: High  Note:    Current Barriers:  Unable to achieve control of cholesterol   Pharmacist Clinical Goal(s):  Patient will achieve control of cholesterol as evidenced by LDL through collaboration with PharmD and provider.   Interventions: 1:1 collaboration with Cox, Elnita Maxwell, MD regarding development and update of comprehensive plan of care as evidenced by provider attestation and co-signature Inter-disciplinary care team collaboration (see longitudinal plan of care) Comprehensive medication review performed; medication list updated in electronic medical record  Hypertension (BP goal <130/80) -Controlled -Current treatment: Atenolol 25 mg daily  Hydralazine 25 mg am and pm  Valsartan-hydrochlorothizide 320/25 mg daily in am -Medications previously tried: none reported  -Checking blood pressure daily 135/73 last night.  -Current dietary patterns: Trying to 80/20 rule. 80% of food is healthy. Occasionally has a hot dog, hamburger, candy bar, coke or dessert. Tries to limit sodium.  -Current exercise habits: Weight lifting and exercising  -Current exercise habits: Walks  at least 6000 steps each day and lifting weights.  -Denies hypotensive/hypertensive symptoms -Educated on BP goals and benefits of medications for prevention of heart attack, stroke and kidney damage; Daily salt intake goal < 2300 mg; Exercise goal of 150 minutes per week; -Counseled to monitor BP at home daily, document, and provide log at future appointments -Counseled on diet and exercise extensively Educated on benefits of weight loss, healthy diet and exercise with patient goal of reducing medications with continue weight loss  Hyperlipidemia: (LDL goal < 100) -Not ideally controlled -Current treatment: myalgia documented - working on diet/exercise to improve LDL -Medications previously tried: fenofibrate, zetia, rosuvastatin (leg pain), statins (leg pain) -Current dietary patterns: Trying to 80/20 rule. 80% of food is healthy. Bakes instead of fried. Cut back on meat. Occasionally has a hot dog, hamburger, candy bar, coke or dessert.  -Current exercise habits: Weight lifting and exercising. Walking 6000 steps each day around her yard. Works in [employer] and yards.  -Educated on Cholesterol goals;  Benefits of statin for ASCVD risk reduction; Importance of limiting foods high in cholesterol; Exercise goal of 150 minutes per week; -Counseled on diet and exercise extensively Recommended considering Zetia like Dr. Tobie Poet recommended but patient prefers to continue working on diet/exercise/lffestyle.   Diabetes (A1c goal <7%) -Controlled -Current medications: metformin 1000 mg bid  -Medications previously tried:  synjardy (nausea with Jardiance) -Current home glucose readings fasting glucose: 100, 89, 103 Evening: 81, 119 -Denies hypoglycemic/hyperglycemic symptoms -Current meal patterns:  breakfast: eating healthier  Lunch and dinner: reduced sugar. 80/20 rule.  snacks:  hard life saver or peppermint to get over a sweet craving.  drinks: unsweet, tea, occasional coke -Current  exercise: Weight lifting and exercising. Walking 6000 steps each day around her yard. Works in [employer] and yards.  -Educated on A1c  and blood sugar goals; Complications of diabetes including kidney damage, retinal damage, and cardiovascular disease; Exercise goal of 150 minutes per week; Benefits of weight loss; Carbohydrate counting and/or plate method -Counseled to check feet daily and get yearly eye exams -Counseled on diet and exercise extensively Recommended to continue current medication Educated on benefits of weight loss.   Bone Health (Goal continue with good bone health) - dexa scan not on file in Epic  -Controlled -Last DEXA Scan: 01/2017    T-Score femoral neck: 1.057  T-Score lumbar spine: 1.43 -Patient is not a candidate for pharmacologic treatment -Current treatment  Vitamin D + K -Medications previously tried: calcium and vitamin d -Recommend [phone] units of vitamin D daily. Recommend 1200 mg of calcium daily from dietary and supplemental sources. Recommend weight-bearing and muscle strengthening exercises for building and maintaining bone density. -Counseled on diet and exercise extensively Educated on calcium-rich diet, exercise and fall prevention.   Hypothyroid  (Goal: controlled) -Controlled -Current treatment  levothyroxine 150 mcg daily  -Medications previously tried: none reported  -Recommended to continue current medication  Asthma (Goal: manage symptoms and prevent exacerbations) -Controlled -Current treatment  albuterol inhaler 2 puffs every 6 hours prn wheezing/shortness of breath Breztri 2 puffs twice daily  Flonase 2 sprays both nostrils daily  Montelukast 10 mg daily  Xyzal 2.5 mg daily  -Medications previously tried: loratadine  -Recommended to continue current medication  Insomnia (Goal: improve sleep) -Controlled -Current treatment  zolpidem 10 mg daily at bedtime  - taking 1/2 tablet daily most days -Medications previously tried: none  reported  -Counseled on Cutting it back to 5 mg most days. Discussed tapering off by using every other day or cut in half. Patient's goal would be to stop taking zolpidem. Reports montelukast and levocetirizine are already causing some sedation.  Educated on good sleep hygiene.   Urinary Retention (Goal: reduce symptsom) -Controlled -Current treatment  tamsulosin 0.4 mg bid  -Medications previously tried: bethanechol -Recommended to continue current medication  Psoriasis/Psoriatic Arthritis(Goal: manage pain and rash) - managed by dermatology -Controlled -Current treatment  Taltz every month -Medications previously tried: Humira, Remicade  -Counseled on diet and exercise extensively Recommended to continue current medication. Patient plans to talk to dermatology at next visit about option of stopping treatment. She reports skin is clear since reducing sugar intake. She states that urology said that Donnetta Hail could be worsening her urinary retention.      Health Maintenance -Vaccine gaps: Shingrix - has had first 1 but not the second one in August. TDAP unsure of last shot. Interested in Pneumovax 23 (last shot was before 55).  -Current therapy:  estradiol vaginal cream 0.31m/gm three times weekly (M/W/F) D-Mannose powder daily Vitamin C with bacillus subtillis 900/20 mg am and pm  Vitamin D and K 1271m/90 mcg daily  Zinc and Bacillus subtillis 20/20 mg daily Multivitamin daily  Vitamin b-12 drops 5000 mcg daily Omega-3 1000 mg daily -Educated on Cost vs benefit of each product must be carefully weighed by individual consumer -Patient is satisfied with current therapy and denies issues -Counseled on diet and exercise extensively Recommended to continue current medication  Patient Goals/Self-Care Activities Patient will:  - take medications as prescribed focus on medication adherence by using a pill box check glucose twice daily, document, and provide at future  appointments check blood pressure daily, document, and provide at future appointments target a minimum of 150 minutes of moderate intensity exercise weekly engage in dietary modifications by continuing to reduce sugar  and focus on healthier eatings.   Follow Up Plan: Telephone follow up appointment with care management team member scheduled for: 03/2021       Medication Assistance: None required.  Patient affirms current coverage meets needs. Patient is not eligible for Breztri pap at this time.    Star Rating Drugs:  Medication:                Last Fill:         Day Supply Valsartan/HCTZ          08/23/20            90 Metformin                    06/21/20            90     Care Gaps: Last annual wellness visit?  06/08/80 If applicable: Last eye exam / retinopathy screening?  08/23/19 Last diabetic foot exam?  07/26/20  Patient's preferred pharmacy is:  [facility] [place], [place] - [address] AT [address] [place] [address] Phone: [phone] Fax: [phone]  Uses pill box? Yes Pt endorses 100% compliance  We discussed: Benefits of medication synchronization, packaging and delivery as well as enhanced pharmacist oversight with [employer]. Patient decided to: Continue current medication management strategy  Care Plan and Follow Up Patient Decision:  Patient agrees to Care Plan and Follow-up.  Plan: Telephone follow up appointment with care management team member scheduled for:  03/2021

## 2020-09-24 DIAGNOSIS — G4733 Obstructive sleep apnea (adult) (pediatric): Secondary | ICD-10-CM | POA: Diagnosis not present

## 2020-09-26 ENCOUNTER — Ambulatory Visit (INDEPENDENT_AMBULATORY_CARE_PROVIDER_SITE_OTHER): Payer: Medicare Other

## 2020-09-26 ENCOUNTER — Other Ambulatory Visit: Payer: Self-pay

## 2020-09-26 DIAGNOSIS — E119 Type 2 diabetes mellitus without complications: Secondary | ICD-10-CM

## 2020-09-26 DIAGNOSIS — E1169 Type 2 diabetes mellitus with other specified complication: Secondary | ICD-10-CM

## 2020-09-26 DIAGNOSIS — E782 Mixed hyperlipidemia: Secondary | ICD-10-CM | POA: Diagnosis not present

## 2020-09-26 DIAGNOSIS — I10 Essential (primary) hypertension: Secondary | ICD-10-CM | POA: Diagnosis not present

## 2020-09-26 DIAGNOSIS — B370 Candidal stomatitis: Secondary | ICD-10-CM

## 2020-09-26 NOTE — Patient Instructions (Signed)
Visit Information  Thank you for your time discussing your medications. I look forward to working with you to achieve your health care goals. Below is a summary of what we talked about during our visit.    Goals Addressed             This Visit's Progress    Manage My Medicine       Timeframe:  Long-Range Goal Priority:  High Start Date:                             Expected End Date:                       Follow Up Date 03/2021   - call for medicine refill 2 or 3 days before it runs out - keep a list of all the medicines I take; vitamins and herbals too - use a pillbox to sort medicine    Why is this important?   These steps will help you keep on track with your medicines.   Notes:      Monitor and Manage My Blood Sugar-Diabetes Type 2       Timeframe:  Long-Range Goal Priority:  High Start Date:                             Expected End Date:                       Follow Up Date 03/2021    - check blood sugar at prescribed times    Why is this important?   Checking your blood sugar at home helps to keep it from getting very high or very low.  Writing the results in a diary or log helps the doctor know how to care for you.  Your blood sugar log should have the time, date and the results.  Also, write down the amount of insulin or other medicine that you take.  Other information, like what you ate, exercise done and how you were feeling, will also be helpful.     Notes:      Set My Target A1C-Diabetes Type 2       Timeframe:  Long-Range Goal Priority:  High Start Date:                             Expected End Date:                       Follow Up Date 03/2021    - set target A1C    Why is this important?   Your target A1C is decided together by you and your doctor.  It is based on several things like your age and other health issues.    Notes:         Patient Care Plan: CCM Pharmacy Care Plan     Problem Identified: dm, hld, htn, hypothyroidism    Priority: High  Onset Date: 09/26/2020     Long-Range Goal: Disease State Management   Start Date: 09/26/2020  Expected End Date: 09/26/2021  This Visit's Progress: On track  Priority: High       Ms. Datta was given information about Chronic Care Management services today including:  CCM service includes personalized support from designated clinical staff  supervised by her physician, including individualized plan of care and coordination with other care providers 24/7 contact phone numbers for assistance for urgent and routine care needs. Standard insurance, coinsurance, copays and deductibles apply for chronic care management only during months in which we provide at least 20 minutes of these services. Most insurances cover these services at 100%, however patients may be responsible for any copay, coinsurance and/or deductible if applicable. This service may help you avoid the need for more expensive face-to-face services. Only one practitioner may furnish and bill the service in a calendar month. The patient may stop CCM services at any time (effective at the end of the month) by phone call to the office staff.  Patient agreed to services and verbal consent obtained.   Patient verbalizes understanding of instructions provided today and agrees to view in [place].  Telephone follow up appointment with pharmacy team member scheduled for: 03/2021  Sherre Poot, PharmD Clinical Pharmacist [place] [phone] (office) [phone] (mobile)

## 2020-10-02 DIAGNOSIS — R531 Weakness: Secondary | ICD-10-CM | POA: Diagnosis not present

## 2020-10-02 DIAGNOSIS — L299 Pruritus, unspecified: Secondary | ICD-10-CM | POA: Diagnosis not present

## 2020-10-02 DIAGNOSIS — L400 Psoriasis vulgaris: Secondary | ICD-10-CM | POA: Diagnosis not present

## 2020-10-02 DIAGNOSIS — L820 Inflamed seborrheic keratosis: Secondary | ICD-10-CM | POA: Diagnosis not present

## 2020-10-02 LAB — HM HEPATITIS C SCREENING LAB: HM Hepatitis Screen: NEGATIVE

## 2020-10-07 ENCOUNTER — Other Ambulatory Visit: Payer: Self-pay | Admitting: Family Medicine

## 2020-10-07 DIAGNOSIS — E782 Mixed hyperlipidemia: Secondary | ICD-10-CM

## 2020-10-07 DIAGNOSIS — I10 Essential (primary) hypertension: Secondary | ICD-10-CM

## 2020-10-10 ENCOUNTER — Other Ambulatory Visit: Payer: Self-pay | Admitting: Physician Assistant

## 2020-10-14 DIAGNOSIS — M5442 Lumbago with sciatica, left side: Secondary | ICD-10-CM | POA: Diagnosis not present

## 2020-10-14 DIAGNOSIS — M9904 Segmental and somatic dysfunction of sacral region: Secondary | ICD-10-CM | POA: Diagnosis not present

## 2020-10-14 DIAGNOSIS — M9901 Segmental and somatic dysfunction of cervical region: Secondary | ICD-10-CM | POA: Diagnosis not present

## 2020-10-14 DIAGNOSIS — M5413 Radiculopathy, cervicothoracic region: Secondary | ICD-10-CM | POA: Diagnosis not present

## 2020-10-14 DIAGNOSIS — M50323 Other cervical disc degeneration at C6-C7 level: Secondary | ICD-10-CM | POA: Diagnosis not present

## 2020-10-14 DIAGNOSIS — M5137 Other intervertebral disc degeneration, lumbosacral region: Secondary | ICD-10-CM | POA: Diagnosis not present

## 2020-10-14 DIAGNOSIS — M9903 Segmental and somatic dysfunction of lumbar region: Secondary | ICD-10-CM | POA: Diagnosis not present

## 2020-10-14 DIAGNOSIS — M5136 Other intervertebral disc degeneration, lumbar region: Secondary | ICD-10-CM | POA: Diagnosis not present

## 2020-10-16 ENCOUNTER — Telehealth: Payer: Self-pay

## 2020-10-16 NOTE — Telephone Encounter (Signed)
Pt was seen by oral surgery and deciding on implants. Oral surgeon is requesting when getting next lab work pt is tested for minerals, vitamins, and hormones. Especially requesting vitamin D. Pt will have blood work on 8/29 then see Dr. Tobie Poet on 9/2. Please advise.   Royce Macadamia, [place] 10/16/20 8:29 AM

## 2020-10-17 DIAGNOSIS — M5136 Other intervertebral disc degeneration, lumbar region: Secondary | ICD-10-CM | POA: Diagnosis not present

## 2020-10-17 DIAGNOSIS — M5413 Radiculopathy, cervicothoracic region: Secondary | ICD-10-CM | POA: Diagnosis not present

## 2020-10-17 DIAGNOSIS — M5442 Lumbago with sciatica, left side: Secondary | ICD-10-CM | POA: Diagnosis not present

## 2020-10-17 DIAGNOSIS — M9901 Segmental and somatic dysfunction of cervical region: Secondary | ICD-10-CM | POA: Diagnosis not present

## 2020-10-17 DIAGNOSIS — M50323 Other cervical disc degeneration at C6-C7 level: Secondary | ICD-10-CM | POA: Diagnosis not present

## 2020-10-17 DIAGNOSIS — M9904 Segmental and somatic dysfunction of sacral region: Secondary | ICD-10-CM | POA: Diagnosis not present

## 2020-10-17 DIAGNOSIS — M9903 Segmental and somatic dysfunction of lumbar region: Secondary | ICD-10-CM | POA: Diagnosis not present

## 2020-10-17 DIAGNOSIS — M5137 Other intervertebral disc degeneration, lumbosacral region: Secondary | ICD-10-CM | POA: Diagnosis not present

## 2020-10-17 NOTE — Telephone Encounter (Signed)
Called pt. Pt states she is actually going first of September to [facility] management and they will be doing extensive lab work then. States she actually does not need [place] to do these as they will be included in that testing.  Royce Macadamia, [place] 10/17/20 9:35 AM

## 2020-10-18 ENCOUNTER — Telehealth: Payer: Self-pay

## 2020-10-18 NOTE — Chronic Care Management (AMB) (Addendum)
Chronic Care Management Pharmacy Assistant   Name: Whitney Eaton  MRN: [id number] DOB: April 02, 1950  Reason for Encounter: Disease State/ Diabetes, Hypertension  Recent office visits:  None  Recent consult visits:  None  Hospital visits:  None in previous 6 months  Medications: Outpatient Encounter Medications as of 10/18/2020  Medication Sig   Accu-Chek Softclix Lancets lancets CHECK FASTING BLOOD SUGAR ONCE DAILY   albuterol (VENTOLIN HFA) 108 (90 Base) MCG/ACT inhaler INHALE 2 PUFFS INTO THE LUNGS EVERY 6 HOURS AS NEEDED FOR WHEEZING OR SHORTNESS OF BREATH   atenolol (TENORMIN) 25 MG tablet TAKE 1 TABLET(25 MG) BY MOUTH DAILY   blood glucose meter kit and supplies KIT Check sugars fasting. E11.69 (Patient not taking: Reported on 09/26/2020)   BREZTRI AEROSPHERE 160-9-4.8 MCG/ACT AERO INHALE 2 PUFFS INTO THE LUNGS IN THE MORNING AND AT BEDTIME   Cyanocobalamin (VITAMIN B 12 PO) Take 5,000 mcg by mouth daily. B-12 sublingual drops   estradiol (ESTRACE) 0.1 MG/GM vaginal cream Monday/Wednesday/Friday in the evening   fluticasone (FLONASE) 50 MCG/ACT nasal spray Place 2 sprays into both nostrils daily.   glucose blood (ACCU-CHEK GUIDE) test strip USE TO CHECK FASTING BLOOD SUGAR AS DIRECTED   hydrALAZINE (APRESOLINE) 25 MG tablet TAKE 1 TABLET(25 MG) BY MOUTH IN THE MORNING AND AT BEDTIME   Ixekizumab (TALTZ) 80 MG/ML SOAJ Inject into the skin. Every 2 weeks. Done by Payton Mccallum.   levothyroxine (SYNTHROID) 150 MCG tablet TAKE 1 TABLET(150 MCG) BY MOUTH DAILY   metFORMIN (GLUCOPHAGE) 1000 MG tablet TAKE 1 TABLET(1000 MG) BY MOUTH TWICE DAILY WITH A MEAL   montelukast (SINGULAIR) 10 MG tablet Take 1 tablet (10 mg total) by mouth at bedtime.   Multiple Vitamins-Minerals (SENIOR MULTIVITAMIN PLUS PO) Take 1 tablet by mouth daily.   Omega-3 1000 MG CAPS Take 1 capsule by mouth daily.   OVER THE COUNTER MEDICATION Take 1 capsule by mouth 2 (two) times daily. Vitamin C (from Lipid Metabolite  Ascorbate) + Bacillus subtillis 900/200   OVER THE COUNTER MEDICATION Take 1 tablet by mouth daily. Zinc and Bacillus Subtillis 20/20 mg daily   Spacer/Aero-Holding [facility] Use as directed with inhaler   tamsulosin (FLOMAX) 0.4 MG CAPS capsule Take 0.4 mg by mouth 2 (two) times daily.   UNABLE TO FIND Med Name: D-Mannose Powder - 1 teaspoonful in cup of water   valsartan-hydrochlorothiazide (DIOVAN-HCT) 320-25 MG tablet TAKE 1 TABLET BY MOUTH DAILY (Patient taking differently: Take 1 tablet by mouth in the morning.)   VITAMIN D-VITAMIN K PO Take 1 tablet by mouth daily.   zolpidem (AMBIEN) 10 MG tablet TAKE 1 TABLET(10 MG) BY MOUTH AT BEDTIME (Patient taking differently: Take 10 mg by mouth at bedtime.)   No facility-administered encounter medications on file as of 10/18/2020.    Recent Office Vitals: BP Readings from Last 3 Encounters:  09/03/20 126/82  08/19/20 128/72  07/31/20 (!) 177/69   Pulse Readings from Last 3 Encounters:  09/03/20 62  08/19/20 72  07/31/20 66    Wt Readings from Last 3 Encounters:  09/03/20 285 lb 3.2 oz (129.4 kg)  08/19/20 282 lb (127.9 kg)  07/31/20 284 lb (128.8 kg)     Kidney Function Lab Results  Component Value Date/Time   CREATININE 0.82 07/23/2020 02:44 PM   CREATININE 0.83 04/22/2020 12:00 PM   GFRNONAA 72 04/22/2020 12:00 PM   GFRAA 83 04/22/2020 12:00 PM    BMP Latest Ref Rng & Units 07/23/2020 04/22/2020 01/17/2020  Glucose  65 - 99 mg/dL 103(H) 97 119(H)  BUN 8 - 27 mg/dL _0 Creatinine 0.57 - 1.00 mg/dL 0.82 0.83 0.85  BUN/Creat Ratio 12 - _1 Sodium 134 - 144 mmol/L 135 138 136  Potassium 3.5 - 5.2 mmol/L 4.0 3.9 3.7  Chloride 96 - 106 mmol/L 94(L) 98 93(L)  CO2 20 - 29 mmol/L _2 Calcium 8.7 - 10.3 mg/dL 9.5 9.7 9.7     Current antihypertensive regimen:  Atenolol 25 mg daily  Hydralazine 25 mg am and pm  Valsartan-hydrochlorothizide 320/25 mg daily   Patient verbally confirms she is taking the  above medications as directed. Yes  How often are you checking your Blood Pressure? 3-5x per week  she checks her blood pressure at nighttime after taking her medication.  Current home BP readings:   DATE:             BP               PULSE  10-17-2020   139/71   10-13-2020   134/65 10-11-2020   110/60  Wrist or arm cuff: wrist Caffeine intake: small  Salt intake: Limited OTC medications including pseudoephedrine or NSAIDs?  Any readings above 180/120? No   What recent interventions/DTPs have been made by any provider to improve Blood Pressure control since last CPP Visit:  Educated on BP goals and benefits of medications for prevention of heart attack, stroke and kidney damage Daily salt intake goal < 2300 mg Exercise goal of 150 minutes per week Counseled to monitor BP at home daily, document, and provide log at future appointments  Any recent hospitalizations or ED visits since last visit with CPP? No  What diet changes have been made to improve Blood Pressure Control?  Patient states she eats plenty of protein, vegetables, fruits and drinks plenty of water. Patient is starting healthy weight wellness soon.  What exercise is being done to improve your Blood Pressure Control?  Patient states she was active earlier in the summer but cant do much since her recent fall. Patient states she tripped on something on her porch an bruised her right side. Patient saw a chiropractor and found out she sprained her back. Patient is unable to lift and drive.  Adherence Review: Is the patient currently on ACE/ARB medication? No Does the patient have >5 day gap between last estimated fill dates? CPP to review   Recent Relevant Labs: Lab Results  Component Value Date/Time   HGBA1C 6.1 (H) 07/23/2020 02:44 PM   HGBA1C 6.5 (H) 04/22/2020 12:00 PM   MICROALBUR 10 04/24/2020 06:52 PM   MICROALBUR 10 09/25/2019 11:23 AM    Kidney Function Lab Results  Component Value Date/Time    CREATININE 0.82 07/23/2020 02:44 PM   CREATININE 0.83 04/22/2020 12:00 PM   GFRNONAA 72 04/22/2020 12:00 PM   GFRAA 83 04/22/2020 12:00 PM     Current antihyperglycemic regimen:  Metformin 1000 mg twice daily   Patient verbally confirms she is taking the above medications as directed. Yes  What recent interventions/DTPs have been made to improve glycemic control:  Educated on A1c and blood sugar goals Complications of diabetes including kidney damage, retinal damage, and cardiovascular disease Exercise goal of 150 minutes per week Benefits of weight loss  Have there been any recent hospitalizations or ED visits since last visit with CPP? No  Patient denies hypoglycemic symptoms  Patient denies hyperglycemic symptoms  How often are you checking  your blood sugar? once daily and twice daily  What are your blood sugars ranging?  Fasting: 99, 115, 98 Before meals: None After meals: 132 Bedtime: 106, 104, 130  On insulin? No   During the week, how often does your blood glucose drop below 70? Never  Are you checking your feet daily/regularly? Yes  Adherence Review: Is the patient currently on a STATIN medication? No Is the patient currently on ACE/ARB medication? Yes Does the patient have >5 day gap between last estimated fill dates? CPP to review  Care Gaps: Last eye exam / Retinopathy Screening? 09-04-2020 Last Annual Wellness Visit? 06-04-2020 Last Diabetic Foot Exam? 07-26-2020  Star Rating Drugs:  Medication:  Last Fill: Day Supply Valsartan/HCTZ 30-25 mg  08-23-2020 90       Metformin 1000 mg 09-17-2020  90 Atenolol 25 mg 10-07-2020  90  NOTES: Patient states she is still taking Ambien since she has injured herself it's been difficult to sleep. Patient states she will try and wean down on medication in about a month.  [place] Pharmacist Assistant [phone]

## 2020-10-21 DIAGNOSIS — M5442 Lumbago with sciatica, left side: Secondary | ICD-10-CM | POA: Diagnosis not present

## 2020-10-21 DIAGNOSIS — M9903 Segmental and somatic dysfunction of lumbar region: Secondary | ICD-10-CM | POA: Diagnosis not present

## 2020-10-21 DIAGNOSIS — M9901 Segmental and somatic dysfunction of cervical region: Secondary | ICD-10-CM | POA: Diagnosis not present

## 2020-10-21 DIAGNOSIS — M5137 Other intervertebral disc degeneration, lumbosacral region: Secondary | ICD-10-CM | POA: Diagnosis not present

## 2020-10-21 DIAGNOSIS — M5136 Other intervertebral disc degeneration, lumbar region: Secondary | ICD-10-CM | POA: Diagnosis not present

## 2020-10-21 DIAGNOSIS — M50323 Other cervical disc degeneration at C6-C7 level: Secondary | ICD-10-CM | POA: Diagnosis not present

## 2020-10-21 DIAGNOSIS — M5413 Radiculopathy, cervicothoracic region: Secondary | ICD-10-CM | POA: Diagnosis not present

## 2020-10-21 DIAGNOSIS — M9904 Segmental and somatic dysfunction of sacral region: Secondary | ICD-10-CM | POA: Diagnosis not present

## 2020-10-24 DIAGNOSIS — M5136 Other intervertebral disc degeneration, lumbar region: Secondary | ICD-10-CM | POA: Diagnosis not present

## 2020-10-24 DIAGNOSIS — M9903 Segmental and somatic dysfunction of lumbar region: Secondary | ICD-10-CM | POA: Diagnosis not present

## 2020-10-24 DIAGNOSIS — M9904 Segmental and somatic dysfunction of sacral region: Secondary | ICD-10-CM | POA: Diagnosis not present

## 2020-10-24 DIAGNOSIS — M9901 Segmental and somatic dysfunction of cervical region: Secondary | ICD-10-CM | POA: Diagnosis not present

## 2020-10-24 DIAGNOSIS — M5137 Other intervertebral disc degeneration, lumbosacral region: Secondary | ICD-10-CM | POA: Diagnosis not present

## 2020-10-24 DIAGNOSIS — M50323 Other cervical disc degeneration at C6-C7 level: Secondary | ICD-10-CM | POA: Diagnosis not present

## 2020-10-24 DIAGNOSIS — M5413 Radiculopathy, cervicothoracic region: Secondary | ICD-10-CM | POA: Diagnosis not present

## 2020-10-24 DIAGNOSIS — M5442 Lumbago with sciatica, left side: Secondary | ICD-10-CM | POA: Diagnosis not present

## 2020-10-28 ENCOUNTER — Other Ambulatory Visit: Payer: Medicare Other

## 2020-10-28 ENCOUNTER — Ambulatory Visit (INDEPENDENT_AMBULATORY_CARE_PROVIDER_SITE_OTHER): Payer: Medicare Other | Admitting: Legal Medicine

## 2020-10-28 ENCOUNTER — Other Ambulatory Visit: Payer: Self-pay

## 2020-10-28 ENCOUNTER — Encounter: Payer: Self-pay | Admitting: Legal Medicine

## 2020-10-28 VITALS — BP 130/90 | HR 60 | Temp 97.2°F | Ht 68.0 in | Wt 290.0 lb

## 2020-10-28 DIAGNOSIS — E782 Mixed hyperlipidemia: Secondary | ICD-10-CM | POA: Diagnosis not present

## 2020-10-28 DIAGNOSIS — I10 Essential (primary) hypertension: Secondary | ICD-10-CM

## 2020-10-28 DIAGNOSIS — Z6841 Body Mass Index (BMI) 40.0 and over, adult: Secondary | ICD-10-CM | POA: Diagnosis not present

## 2020-10-28 DIAGNOSIS — E1169 Type 2 diabetes mellitus with other specified complication: Secondary | ICD-10-CM

## 2020-10-28 DIAGNOSIS — L4050 Arthropathic psoriasis, unspecified: Secondary | ICD-10-CM

## 2020-10-28 DIAGNOSIS — M545 Low back pain, unspecified: Secondary | ICD-10-CM

## 2020-10-28 DIAGNOSIS — E119 Type 2 diabetes mellitus without complications: Secondary | ICD-10-CM

## 2020-10-28 DIAGNOSIS — W19XXXA Unspecified fall, initial encounter: Secondary | ICD-10-CM

## 2020-10-28 MED ORDER — TRIAMCINOLONE ACETONIDE 40 MG/ML IJ SUSP
80.0000 mg | Freq: Once | INTRAMUSCULAR | Status: AC
  Administered 2020-10-28: 80 mg via INTRAMUSCULAR

## 2020-10-28 MED ORDER — TRAMADOL HCL 50 MG PO TABS: 50.0000 mg | ORAL_TABLET | Freq: Four times a day (QID) | ORAL | 1 refills | Status: DC | PRN

## 2020-10-28 NOTE — Progress Notes (Signed)
Established Patient Office Visit  Subjective:  Patient ID: Whitney Eaton, female    DOB: June 24, 1950  Age: 70 y.o. MRN: [id number]  CC:  Chief Complaint  Patient presents with   Fall    Patient states she fell 5 weeks ago (last week of July), fell on her left side on concrete patio, she had a few bruises and abrasions. Patient states se didn't hit her head, had a hard time getting up. Her hips are bothering her again, would like a shot    HPI Whitney Eaton presents for Back and hip pain. She fell on porch over electric shoe cleaner and fell 5 weeks ago, she had effusion and saw chiropractor. Now pain in right hip. Worse with stairs, and driving.  She has history of low back pain.  Past Medical History:  Diagnosis Date   Asthma    Diabetes ([place])    Hyperlipemia    Hypertension    Psoriasis    Sleep apnea    Thyroid disease     Past Surgical History:  Procedure Laterality Date   ABDOMINAL HYSTERECTOMY     CHOLECYSTECTOMY  2011   HYSTERECTOMY ABDOMINAL WITH SALPINGECTOMY     OOPHORECTOMY  1980    Family History  Problem Relation Age of Onset   Alzheimer's disease Mother    Breast cancer Mother    Arthritis Mother    Diabetes Mother    Eczema Father    CAD Father    Diabetes Father    Parkinson's disease Father    Breast cancer Sister    Neurologic Disorder Brother    Glaucoma Maternal Grandmother    Heart disease Other     Social History   Socioeconomic History   Marital status: Married    Spouse name: Not on file   Number of children: 2   Years of education: Not on file   Highest education level: Not on file  Occupational History   Occupation: homemaker  Tobacco Use   Smoking status: Never   Smokeless tobacco: Never  Vaping Use   Vaping Use: Never used  Substance and Sexual Activity   Alcohol use: Never   Drug use: Never   Sexual activity: Not on file  Other Topics Concern   Not on file  Social History Narrative   Not on file   Social  Determinants of Health   Financial Resource Strain: Low Risk    Difficulty of Paying Living Expenses: Not hard at all  Food Insecurity: No Food Insecurity   Worried About Charity fundraiser in the Last Year: Never true   [place] in the Last Year: Never true  Transportation Needs: No Transportation Needs   Lack of Transportation (Medical): No   Lack of Transportation (Non-Medical): No  Physical Activity: Insufficiently Active   Days of Exercise per Week: 4 days   Minutes of Exercise per Session: 20 min  Stress: No Stress Concern Present   Feeling of Stress : Only a little  Social Connections: Engineer, building services of Communication with Friends and Family: More than three times a week   Frequency of Social Gatherings with Friends and Family: More than three times a week   Attends Religious Services: More than 4 times per year   Active Member of [employer] or Organizations: Yes   Attends Music therapist: More than 4 times per year   Marital Status: Married  Human resources officer Violence: Not At Risk   Fear  of Current or Ex-Partner: No   Emotionally Abused: No   Physically Abused: No   Sexually Abused: No    Outpatient Medications Prior to Visit  Medication Sig Dispense Refill   Accu-Chek Softclix Lancets lancets CHECK FASTING BLOOD SUGAR ONCE DAILY 100 each 3   albuterol (VENTOLIN HFA) 108 (90 Base) MCG/ACT inhaler INHALE 2 PUFFS INTO THE LUNGS EVERY 6 HOURS AS NEEDED FOR WHEEZING OR SHORTNESS OF BREATH 8.5 g 1   atenolol (TENORMIN) 25 MG tablet TAKE 1 TABLET(25 MG) BY MOUTH DAILY 90 tablet 1   blood glucose meter kit and supplies KIT Check sugars fasting. E11.69 1 each 0   BREZTRI AEROSPHERE 160-9-4.8 MCG/ACT AERO INHALE 2 PUFFS INTO THE LUNGS IN THE MORNING AND AT BEDTIME 10.7 g 0   Cyanocobalamin (VITAMIN B 12 PO) Take 5,000 mcg by mouth daily. B-12 sublingual drops     estradiol (ESTRACE) 0.1 MG/GM vaginal cream Monday/Wednesday/Friday in the evening      fluticasone (FLONASE) 50 MCG/ACT nasal spray Place 2 sprays into both nostrils daily. 16 g 6   glucose blood (ACCU-CHEK GUIDE) test strip USE TO CHECK FASTING BLOOD SUGAR AS DIRECTED 50 strip 4   hydrALAZINE (APRESOLINE) 25 MG tablet TAKE 1 TABLET(25 MG) BY MOUTH IN THE MORNING AND AT BEDTIME 180 tablet 0   Ixekizumab (TALTZ) 80 MG/ML SOAJ Inject into the skin. Every 2 weeks. Done by Payton Mccallum.     levocetirizine (XYZAL) 5 MG tablet Take 5 mg by mouth every evening.     levothyroxine (SYNTHROID) 150 MCG tablet TAKE 1 TABLET(150 MCG) BY MOUTH DAILY 90 tablet 1   meloxicam (MOBIC) 15 MG tablet Take 15 mg by mouth daily.     metFORMIN (GLUCOPHAGE) 1000 MG tablet TAKE 1 TABLET(1000 MG) BY MOUTH TWICE DAILY WITH A MEAL 180 tablet 0   montelukast (SINGULAIR) 10 MG tablet Take 1 tablet (10 mg total) by mouth at bedtime. 30 tablet 11   Multiple Vitamins-Minerals (SENIOR MULTIVITAMIN PLUS PO) Take 1 tablet by mouth daily.     Omega-3 1000 MG CAPS Take 1 capsule by mouth daily.     tamsulosin (FLOMAX) 0.4 MG CAPS capsule Take 0.4 mg by mouth 2 (two) times daily.     UNABLE TO FIND Med Name: D-Mannose Powder - 1 teaspoonful in cup of water     valsartan-hydrochlorothiazide (DIOVAN-HCT) 320-25 MG tablet TAKE 1 TABLET BY MOUTH DAILY (Patient taking differently: Take 1 tablet by mouth in the morning.) 90 tablet 0   zolpidem (AMBIEN) 10 MG tablet TAKE 1 TABLET(10 MG) BY MOUTH AT BEDTIME (Patient taking differently: Take 10 mg by mouth at bedtime.) 90 tablet 1   traMADol (ULTRAM) 50 MG tablet Take 50 mg by mouth every 6 (six) hours as needed for moderate pain or severe pain.     OVER THE COUNTER MEDICATION Take 1 capsule by mouth 2 (two) times daily. Vitamin C (from Lipid Metabolite Ascorbate) + Bacillus subtillis 900/200     OVER THE COUNTER MEDICATION Take 1 tablet by mouth daily. Zinc and Bacillus Subtillis 20/20 mg daily     Spacer/Aero-Holding [facility] Use as directed with inhaler 1 each 0   VITAMIN  D-VITAMIN K PO Take 1 tablet by mouth daily.     No facility-administered medications prior to visit.    Allergies  Allergen Reactions   Crestor [Rosuvastatin]     myalgia   Fenofibrate     myalgia   Jardiance [Empagliflozin]     Nausea.  Lipitor [Atorvastatin]     myalgia    ROS Review of Systems  Constitutional:  Negative for activity change and appetite change.  HENT:  Negative for congestion.   Eyes:  Negative for visual disturbance.  Respiratory:  Negative for chest tightness and shortness of breath.   Cardiovascular:  Negative for chest pain and palpitations.  Gastrointestinal:  Negative for abdominal distention and abdominal pain.  Endocrine: Negative for polyuria.  Genitourinary:  Negative for difficulty urinating and dysuria.  Musculoskeletal:  Negative for arthralgias and back pain (right back).  Neurological: Negative.   Psychiatric/Behavioral: Negative.       Objective:    Physical Exam Vitals reviewed.  Constitutional:      General: She is not in acute distress.    Appearance: Normal appearance.  HENT:     Right Ear: Tympanic membrane normal.     Left Ear: Tympanic membrane normal.  Eyes:     Extraocular Movements: Extraocular movements intact.     Conjunctiva/sclera: Conjunctivae normal.     Pupils: Pupils are equal, round, and reactive to light.  Cardiovascular:     Rate and Rhythm: Normal rate and regular rhythm.     Pulses: Normal pulses.     Heart sounds: Normal heart sounds. No murmur heard.   No gallop.  Pulmonary:     Effort: Pulmonary effort is normal. No respiratory distress.     Breath sounds: Normal breath sounds. No wheezing.  Abdominal:     General: Abdomen is flat. Bowel sounds are normal. There is no distension.     Palpations: Abdomen is soft.     Tenderness: There is no abdominal tenderness.  Musculoskeletal:     Lumbar back: Spasms and tenderness present. Decreased range of motion. Negative right straight leg raise test and  negative left straight leg raise test.       Back:  Skin:    Capillary Refill: Capillary refill takes less than 2 seconds.  Neurological:     General: No focal deficit present.     Mental Status: She is alert and oriented to person, place, and time. Mental status is at baseline.    BP 130/90   Pulse 60   Temp (!) 97.2 F (36.2 C)   Ht 5' 8"  (1.727 m)   Wt 290 lb (131.5 kg)   SpO2 97%   BMI 44.09 kg/m  Wt Readings from Last 3 Encounters:  10/28/20 290 lb (131.5 kg)  09/03/20 285 lb 3.2 oz (129.4 kg)  08/19/20 282 lb (127.9 kg)     Health Maintenance Due  Topic Date Due   Hepatitis C Screening  Never done   TETANUS/TDAP  Never done   Zoster Vaccines- Shingrix (1 of 2) Never done   PNA vac Low Risk Adult (2 of 2 - PPSV23) 08/05/2017   OPHTHALMOLOGY EXAM  08/22/2020   INFLUENZA VACCINE  09/30/2020    There are no preventive care reminders to display for this patient.  Lab Results  Component Value Date   TSH 3.510 01/17/2020   Lab Results  Component Value Date   WBC 9.4 07/23/2020   HGB 12.6 07/23/2020   HCT 38.4 07/23/2020   MCV 91 07/23/2020   PLT 236 07/23/2020   Lab Results  Component Value Date   NA 135 07/23/2020   K 4.0 07/23/2020   CO2 26 07/23/2020   GLUCOSE 103 (H) 07/23/2020   BUN 17 07/23/2020   CREATININE 0.82 07/23/2020   BILITOT 0.5 07/23/2020   ALKPHOS  76 07/23/2020   AST 13 07/23/2020   ALT 21 07/23/2020   PROT 6.4 07/23/2020   ALBUMIN 4.1 07/23/2020   CALCIUM 9.5 07/23/2020   EGFR 77 07/23/2020   Lab Results  Component Value Date   CHOL 189 07/23/2020   Lab Results  Component Value Date   HDL 59 07/23/2020   Lab Results  Component Value Date   LDLCALC 113 (H) 07/23/2020   Lab Results  Component Value Date   TRIG 93 07/23/2020   Lab Results  Component Value Date   CHOLHDL 3.2 07/23/2020   Lab Results  Component Value Date   HGBA1C 6.1 (H) 07/23/2020      Assessment & Plan:   Problem List Items Addressed This  Visit       Musculoskeletal and Integument   Psoriatic arthritis ([place])   Relevant Medications   meloxicam (MOBIC) 15 MG tablet   traMADol (ULTRAM) 50 MG tablet Patient has chronic arthritis [place]     Other   Morbid obesity ([place]) An individualize plan was formulated for obesity using patient history and physical exam to encourage weight loss.  An evidence based program was formulated.  Patient is to cut portion size with meals and to plan physical exercise 3 days a week at least 20 minutes.  Weight watchers and other programs are helpful.  Planned amount of weight loss 10 lbs.     BMI 40.0-44.9, adult [facility]) An individualize plan was formulated for obesity using patient history and physical exam to encourage weight loss.  An evidence based program was formulated.  Patient is to cut portion size with meals and to plan physical exercise 3 days a week at least 20 minutes.  Weight watchers and other programs are helpful.  Planned amount of weight loss 10 lbs.    Other Visit Diagnoses     Fall, initial encounter    -  Primary Patient feel 6 weeks ago and continues to have pain in right Si area.    Acute right-sided low back pain without sciatica       Relevant Medications   meloxicam (MOBIC) 15 MG tablet   triamcinolone acetonide (KENALOG-40) injection 80 mg (Completed)   traMADol (ULTRAM) 50 MG tablet Patient had exacerbation of back pain from fall, she has chiropractor also.       Meds ordered this encounter  Medications   triamcinolone acetonide (KENALOG-40) injection 80 mg   traMADol (ULTRAM) 50 MG tablet    Sig: Take 1 tablet (50 mg total) by mouth every 6 (six) hours as needed for moderate pain or severe pain.    Dispense:  30 tablet    Refill:  1    Follow-up: No follow-ups on file.    Reinaldo Meeker, MD

## 2020-10-29 ENCOUNTER — Ambulatory Visit (INDEPENDENT_AMBULATORY_CARE_PROVIDER_SITE_OTHER): Payer: Medicare Other | Admitting: Family Medicine

## 2020-10-29 LAB — COMPREHENSIVE METABOLIC PANEL
ALT: 22 IU/L (ref 0–32)
AST: 25 IU/L (ref 0–40)
Albumin/Globulin Ratio: 1.6 (ref 1.2–2.2)
Albumin: 3.9 g/dL (ref 3.8–4.8)
Alkaline Phosphatase: 75 IU/L (ref 44–121)
BUN/Creatinine Ratio: 23 (ref 12–28)
BUN: 16 mg/dL (ref 8–27)
Bilirubin Total: 0.7 mg/dL (ref 0.0–1.2)
CO2: 27 mmol/L (ref 20–29)
Calcium: 9.2 mg/dL (ref 8.7–10.3)
Chloride: 100 mmol/L (ref 96–106)
Creatinine, Ser: 0.71 mg/dL (ref 0.57–1.00)
Globulin, Total: 2.4 g/dL (ref 1.5–4.5)
Glucose: 93 mg/dL (ref 65–99)
Potassium: 4.1 mmol/L (ref 3.5–5.2)
Sodium: 138 mmol/L (ref 134–144)
Total Protein: 6.3 g/dL (ref 6.0–8.5)
eGFR: 92 mL/min/{1.73_m2} (ref >59)

## 2020-10-29 LAB — LIPID PANEL
Chol/HDL Ratio: 2.8 ratio (ref 0.0–4.4)
Cholesterol, Total: 146 mg/dL (ref 100–199)
HDL: 53 mg/dL (ref >39)
LDL Chol Calc (NIH): 76 mg/dL (ref 0–99)
Triglycerides: 89 mg/dL (ref 0–149)
VLDL Cholesterol Cal: 17 mg/dL (ref 5–40)

## 2020-10-29 LAB — CBC WITH DIFFERENTIAL/PLATELET
Basophils Absolute: 0.1 10*3/uL (ref 0.0–0.2)
Basos: 1 %
EOS (ABSOLUTE): 0.2 10*3/uL (ref 0.0–0.4)
Eos: 2 %
Hematocrit: 36.3 % (ref 34.0–46.6)
Hemoglobin: 11.7 g/dL (ref 11.1–15.9)
Immature Grans (Abs): 0 10*3/uL (ref 0.0–0.1)
Immature Granulocytes: 0 %
Lymphocytes Absolute: 1.8 10*3/uL (ref 0.7–3.1)
Lymphs: 17 %
MCH: 29.8 pg (ref 26.6–33.0)
MCHC: 32.2 g/dL (ref 31.5–35.7)
MCV: 93 fL (ref 79–97)
Monocytes Absolute: 0.7 10*3/uL (ref 0.1–0.9)
Monocytes: 7 %
Neutrophils Absolute: 7.4 10*3/uL — ABNORMAL HIGH (ref 1.4–7.0)
Neutrophils: 73 %
Platelets: 235 10*3/uL (ref 150–450)
RBC: 3.92 x10E6/uL (ref 3.77–5.28)
RDW: 14.2 % (ref 11.7–15.4)
WBC: 10.2 10*3/uL (ref 3.4–10.8)

## 2020-10-29 LAB — HEMOGLOBIN A1C
Est. average glucose Bld gHb Est-mCnc: 123 mg/dL
Hgb A1c MFr Bld: 5.9 % — ABNORMAL HIGH (ref 4.8–5.6)

## 2020-10-29 LAB — CARDIOVASCULAR RISK ASSESSMENT

## 2020-11-01 ENCOUNTER — Other Ambulatory Visit: Payer: Self-pay

## 2020-11-01 ENCOUNTER — Encounter: Payer: Self-pay | Admitting: Family Medicine

## 2020-11-01 ENCOUNTER — Ambulatory Visit (INDEPENDENT_AMBULATORY_CARE_PROVIDER_SITE_OTHER): Payer: Medicare Other | Admitting: Family Medicine

## 2020-11-01 VITALS — BP 128/72 | HR 67 | Temp 96.0°F | Ht 68.0 in | Wt 293.0 lb

## 2020-11-01 DIAGNOSIS — E782 Mixed hyperlipidemia: Secondary | ICD-10-CM

## 2020-11-01 DIAGNOSIS — Z780 Asymptomatic menopausal state: Secondary | ICD-10-CM

## 2020-11-01 DIAGNOSIS — G4733 Obstructive sleep apnea (adult) (pediatric): Secondary | ICD-10-CM

## 2020-11-01 DIAGNOSIS — I10 Essential (primary) hypertension: Secondary | ICD-10-CM | POA: Diagnosis not present

## 2020-11-01 DIAGNOSIS — F5101 Primary insomnia: Secondary | ICD-10-CM | POA: Diagnosis not present

## 2020-11-01 DIAGNOSIS — E038 Other specified hypothyroidism: Secondary | ICD-10-CM | POA: Diagnosis not present

## 2020-11-01 DIAGNOSIS — Z23 Encounter for immunization: Secondary | ICD-10-CM | POA: Diagnosis not present

## 2020-11-01 DIAGNOSIS — Z13820 Encounter for screening for osteoporosis: Secondary | ICD-10-CM

## 2020-11-01 DIAGNOSIS — E119 Type 2 diabetes mellitus without complications: Secondary | ICD-10-CM

## 2020-11-01 DIAGNOSIS — E1169 Type 2 diabetes mellitus with other specified complication: Secondary | ICD-10-CM | POA: Diagnosis not present

## 2020-11-01 NOTE — Progress Notes (Signed)
Subjective:  Patient ID: Whitney Eaton, female    DOB: Sep 17, 1950  Age: 70 y.o. MRN: [id number]  Chief Complaint  Patient presents with   Hypertension   Hyperlipidemia   Diabetes     HPI  DM- checks FBS bid, 70's-140, metformin 1012m BID HTN-atenolol 25 mg daily, diovan-Hct 320-25 mg daily, hydralazine 25 mg Asthma-Breztri. Allergy testing done by Dr. PNelva Bush Not allergic to anything, but on item which blooms in the spring. Dr. PNelva Bushchanged claritin to xyzal.  Cholesterol-Omega 3  OSA- needs new CPAP machine-needs new rx.  Current Outpatient Medications on File Prior to Visit  Medication Sig Dispense Refill   Accu-Chek Softclix Lancets lancets CHECK FASTING BLOOD SUGAR ONCE DAILY 100 each 3   albuterol (VENTOLIN HFA) 108 (90 Base) MCG/ACT inhaler INHALE 2 PUFFS INTO THE LUNGS EVERY 6 HOURS AS NEEDED FOR WHEEZING OR SHORTNESS OF BREATH 8.5 g 1   atenolol (TENORMIN) 25 MG tablet TAKE 1 TABLET(25 MG) BY MOUTH DAILY 90 tablet 1   blood glucose meter kit and supplies KIT Check sugars fasting. E11.69 1 each 0   BREZTRI AEROSPHERE 160-9-4.8 MCG/ACT AERO INHALE 2 PUFFS INTO THE LUNGS IN THE MORNING AND AT BEDTIME 10.7 g 0   Cyanocobalamin (VITAMIN B 12 PO) Take 5,000 mcg by mouth daily. B-12 sublingual drops     estradiol (ESTRACE) 0.1 MG/GM vaginal cream Monday/Wednesday/Friday in the evening     fluticasone (FLONASE) 50 MCG/ACT nasal spray Place 2 sprays into both nostrils daily. 16 g 6   glucose blood (ACCU-CHEK GUIDE) test strip USE TO CHECK FASTING BLOOD SUGAR AS DIRECTED 50 strip 4   hydrALAZINE (APRESOLINE) 25 MG tablet TAKE 1 TABLET(25 MG) BY MOUTH IN THE MORNING AND AT BEDTIME 180 tablet 0   Ixekizumab (TALTZ) 80 MG/ML SOAJ Inject into the skin. Every 2 weeks. Done by DPayton Mccallum     levocetirizine (XYZAL) 5 MG tablet Take 5 mg by mouth every evening.     levothyroxine (SYNTHROID) 150 MCG tablet TAKE 1 TABLET(150 MCG) BY MOUTH DAILY 90 tablet 1   meloxicam (MOBIC) 15 MG tablet Take  15 mg by mouth daily.     metFORMIN (GLUCOPHAGE) 1000 MG tablet TAKE 1 TABLET(1000 MG) BY MOUTH TWICE DAILY WITH A MEAL 180 tablet 0   montelukast (SINGULAIR) 10 MG tablet Take 1 tablet (10 mg total) by mouth at bedtime. 30 tablet 11   Multiple Vitamins-Minerals (SENIOR MULTIVITAMIN PLUS PO) Take 1 tablet by mouth daily.     Omega-3 1000 MG CAPS Take 1 capsule by mouth daily.     tamsulosin (FLOMAX) 0.4 MG CAPS capsule Take 0.4 mg by mouth 2 (two) times daily.     traMADol (ULTRAM) 50 MG tablet Take 1 tablet (50 mg total) by mouth every 6 (six) hours as needed for moderate pain or severe pain. 30 tablet 1   UNABLE TO FIND Med Name: D-Mannose Powder - 1 teaspoonful in cup of water     zolpidem (AMBIEN) 10 MG tablet TAKE 1 TABLET(10 MG) BY MOUTH AT BEDTIME (Patient taking differently: Take 10 mg by mouth at bedtime.) 90 tablet 1   No current facility-administered medications on file prior to visit.   Past Medical History:  Diagnosis Date   Asthma    Diabetes ([place]    Hyperlipemia    Hypertension    Psoriasis    Sleep apnea    Thyroid disease    Past Surgical History:  Procedure Laterality Date   ABDOMINAL HYSTERECTOMY  CHOLECYSTECTOMY  2011   HYSTERECTOMY ABDOMINAL WITH SALPINGECTOMY     OOPHORECTOMY  1980    Family History  Problem Relation Age of Onset   Alzheimer's disease Mother    Breast cancer Mother    Arthritis Mother    Diabetes Mother    Eczema Father    CAD Father    Diabetes Father    Parkinson's disease Father    Breast cancer Sister    Neurologic Disorder Brother    Glaucoma Maternal Grandmother    Heart disease Other    Social History   Socioeconomic History   Marital status: Married    Spouse name: Not on file   Number of children: 2   Years of education: Not on file   Highest education level: Not on file  Occupational History   Occupation: homemaker  Tobacco Use   Smoking status: Never   Smokeless tobacco: Never  Vaping Use   Vaping Use:  Never used  Substance and Sexual Activity   Alcohol use: Never   Drug use: Never   Sexual activity: Not on file  Other Topics Concern   Not on file  Social History Narrative   Not on file   Social Determinants of Health   Financial Resource Strain: Low Risk    Difficulty of Paying Living Expenses: Not hard at all  Food Insecurity: No Food Insecurity   Worried About Charity fundraiser in the Last Year: Never true   [place] in the Last Year: Never true  Transportation Needs: No Transportation Needs   Lack of Transportation (Medical): No   Lack of Transportation (Non-Medical): No  Physical Activity: Insufficiently Active   Days of Exercise per Week: 4 days   Minutes of Exercise per Session: 20 min  Stress: No Stress Concern Present   Feeling of Stress : Only a little  Social Connections: Engineer, building services of Communication with Friends and Family: More than three times a week   Frequency of Social Gatherings with Friends and Family: More than three times a week   Attends Religious Services: More than 4 times per year   Active Member of [employer] or Organizations: Yes   Attends Music therapist: More than 4 times per year   Marital Status: Married    Review of Systems  Constitutional:  Negative for chills, fatigue and fever.  HENT:  Negative for congestion, ear pain, rhinorrhea and sore throat.   Respiratory:  Negative for cough and shortness of breath (Asthma).   Cardiovascular:  Negative for chest pain.  Gastrointestinal:  Negative for abdominal pain, constipation, diarrhea, nausea and vomiting.  Genitourinary:  Negative for dysuria and urgency.  Musculoskeletal:  Positive for arthralgias and back pain. Negative for myalgias.  Neurological:  Negative for dizziness, weakness, light-headedness and headaches.  Psychiatric/Behavioral:  Negative for dysphoric mood. The patient is not nervous/anxious.     Objective:  BP 128/72   Pulse 67   Temp  (!) 96 F (35.6 C)   Ht 5' 8"  (1.727 m)   Wt 293 lb (132.9 kg)   SpO2 97%   BMI 44.55 kg/m   BP/Weight 11/01/2020 0/81/4481 10/04/6312  Systolic BP 970 263 785  Diastolic BP 72 90 82  Wt. (Lbs) 293 290 285.2  BMI 44.55 44.09 43.36    Physical Exam Vitals reviewed.  Constitutional:      Appearance: Normal appearance. She is normal weight.  Neck:     Vascular: No carotid  bruit.  Cardiovascular:     Rate and Rhythm: Normal rate and regular rhythm.     Pulses: Normal pulses.     Heart sounds: Normal heart sounds.  Pulmonary:     Effort: Pulmonary effort is normal. No respiratory distress.     Breath sounds: Normal breath sounds.  Abdominal:     General: Abdomen is flat. Bowel sounds are normal.     Palpations: Abdomen is soft.     Tenderness: There is no abdominal tenderness.  Neurological:     Mental Status: She is alert and oriented to person, place, and time.  Psychiatric:        Mood and Affect: Mood normal.        Behavior: Behavior normal.    Diabetic Foot Exam - Simple   Simple Foot Form Diabetic Foot exam was performed with the following findings: Yes 11/01/2020 12:01 PM  Visual Inspection No deformities, no ulcerations, no other skin breakdown bilaterally: Yes Sensation Testing Intact to touch and monofilament testing bilaterally: Yes Pulse Check Posterior Tibialis and Dorsalis pulse intact bilaterally: Yes Comments      Lab Results  Component Value Date   WBC 10.2 10/28/2020   HGB 11.7 10/28/2020   HCT 36.3 10/28/2020   PLT 235 10/28/2020   GLUCOSE 93 10/28/2020   CHOL 146 10/28/2020   TRIG 89 10/28/2020   HDL 53 10/28/2020   LDLCALC 76 10/28/2020   ALT 22 10/28/2020   AST 25 10/28/2020   NA 138 10/28/2020   K 4.1 10/28/2020   CL 100 10/28/2020   CREATININE 0.71 10/28/2020   BUN 16 10/28/2020   CO2 27 10/28/2020   TSH 3.510 01/17/2020   HGBA1C 5.9 (H) 10/28/2020   MICROALBUR 10 04/24/2020      Assessment & Plan:  1. Benign  hypertension Well controlled.  No changes to medicines.  Continue to work on eating a healthy diet and exercise.  Labs reviewed.   2. Mixed hyperlipidemia Well controlled.  No changes to medicines.  Continue to work on eating a healthy diet and exercise.  Labs reviewed.  3. Morbid obesity (HCC) BMI 44.  Keep follow up appt with healthy weight and wellness clinic.  Consider change of metformin to mounjaro.  4. Primary insomnia The current medical regimen is effective;  continue present plan and medications.  5. Other specified hypothyroidism The current medical regimen is effective;  continue present plan and medications.  6. Combined hyperlipidemia associated with type 2 diabetes mellitus ([place]) Control: great! Recommend check sugars fasting daily. Recommend check feet daily. Recommend annual eye exams. Medicines: Recommend discuss changing metformin to mounjora to help with weight loss and for cv prevention. Continue to work on eating a healthy diet and exercise.  Labs reviewed  7. Obstructive sleep apnea Order new cpap machine through [facility]  8. Need for vaccination - Pneumococcal polysaccharide vaccine 23-valent greater than or equal to 2yo subcutaneous/IM  9. Need for immunization against influenza - Flu Vaccine QUAD High Dose(Fluad)  Follow-up: Return in about 4 months (around 03/03/2021) for likes to come before hand for labs..  An After Visit Summary was printed and given to the patient.  Rochel Brome, MD [facility] [phone]

## 2020-11-04 ENCOUNTER — Encounter: Payer: Self-pay | Admitting: Family Medicine

## 2020-11-04 MED ORDER — VALSARTAN-HYDROCHLOROTHIAZIDE 320-25 MG PO TABS: 1.0000 | ORAL_TABLET | Freq: Every morning | ORAL | 3 refills | Status: DC

## 2020-11-05 ENCOUNTER — Other Ambulatory Visit: Payer: Self-pay | Admitting: Family Medicine

## 2020-11-05 ENCOUNTER — Other Ambulatory Visit: Payer: Self-pay | Admitting: Nurse Practitioner

## 2020-11-05 ENCOUNTER — Other Ambulatory Visit: Payer: Self-pay | Admitting: Physician Assistant

## 2020-11-05 DIAGNOSIS — I10 Essential (primary) hypertension: Secondary | ICD-10-CM

## 2020-11-05 DIAGNOSIS — J301 Allergic rhinitis due to pollen: Secondary | ICD-10-CM

## 2020-11-05 NOTE — Telephone Encounter (Signed)
Refill sent to pharmacy.   

## 2020-11-07 ENCOUNTER — Telehealth: Payer: Self-pay | Admitting: Family Medicine

## 2020-11-07 NOTE — Telephone Encounter (Signed)
   Whitney Eaton has been scheduled for the following appointment:  WHAT: BONE DENSITY WHERE: [facility] DATE: 01/08/21 TIME: 10:30 AM ARRIVAL TIME  Patient has been made aware.

## 2020-11-08 ENCOUNTER — Encounter (INDEPENDENT_AMBULATORY_CARE_PROVIDER_SITE_OTHER): Payer: Self-pay

## 2020-11-08 ENCOUNTER — Ambulatory Visit (INDEPENDENT_AMBULATORY_CARE_PROVIDER_SITE_OTHER): Payer: Medicare Other | Admitting: Bariatrics

## 2020-11-11 DIAGNOSIS — R110 Nausea: Secondary | ICD-10-CM | POA: Diagnosis not present

## 2020-11-11 DIAGNOSIS — K591 Functional diarrhea: Secondary | ICD-10-CM | POA: Diagnosis not present

## 2020-11-12 ENCOUNTER — Ambulatory Visit (INDEPENDENT_AMBULATORY_CARE_PROVIDER_SITE_OTHER): Payer: Medicare Other | Admitting: Family Medicine

## 2020-11-18 ENCOUNTER — Other Ambulatory Visit: Payer: Self-pay | Admitting: Nurse Practitioner

## 2020-11-18 ENCOUNTER — Ambulatory Visit (INDEPENDENT_AMBULATORY_CARE_PROVIDER_SITE_OTHER): Payer: Medicare Other | Admitting: Nurse Practitioner

## 2020-11-18 ENCOUNTER — Encounter: Payer: Self-pay | Admitting: Nurse Practitioner

## 2020-11-18 VITALS — BP 140/68 | HR 66 | Temp 96.7°F | Ht 68.0 in | Wt 265.0 lb

## 2020-11-18 DIAGNOSIS — R634 Abnormal weight loss: Secondary | ICD-10-CM | POA: Diagnosis not present

## 2020-11-18 DIAGNOSIS — R110 Nausea: Secondary | ICD-10-CM | POA: Diagnosis not present

## 2020-11-18 DIAGNOSIS — R197 Diarrhea, unspecified: Secondary | ICD-10-CM | POA: Diagnosis not present

## 2020-11-18 MED ORDER — ONDANSETRON 4 MG PO TBDP: 4.0000 mg | ORAL_TABLET | Freq: Three times a day (TID) | ORAL | 1 refills | Status: DC | PRN

## 2020-11-18 MED ORDER — DIPHENOXYLATE-ATROPINE 2.5-0.025 MG PO TABS
1.0000 | ORAL_TABLET | Freq: Four times a day (QID) | ORAL | 0 refills | Status: DC | PRN
Start: 2020-11-18 — End: 2020-12-10

## 2020-11-18 NOTE — Progress Notes (Signed)
Acute Office Visit  Subjective:    Patient ID: Whitney Eaton, female    DOB: 1950-05-20, 70 y.o.   MRN: [id number]  Chief Complaint  Patient presents with   Diarrhea    HPI Patient is in today evaluation of diarrhea, nausea, and unexplained weight loss. She has lost 28 pounds since last office visit 17-days ago. She denies any known exposure to ill-contacts or changes to medication/diet. She was seen by her PCP on 11/01/20 for follow-up of chronic medical problems and received pneumonia and flu immunizations. States two days later, symptoms began. She was seen at [facility]. Treatment has included Zofran for nausea and BRAT diet.  Past Medical History:  Diagnosis Date   Asthma    Diabetes ([place])    Hyperlipemia    Hypertension    Psoriasis    Sleep apnea    Thyroid disease     Past Surgical History:  Procedure Laterality Date   ABDOMINAL HYSTERECTOMY     CHOLECYSTECTOMY  2011   HYSTERECTOMY ABDOMINAL WITH SALPINGECTOMY     OOPHORECTOMY  1980    Family History  Problem Relation Age of Onset   Alzheimer's disease Mother    Breast cancer Mother    Arthritis Mother    Diabetes Mother    Eczema Father    CAD Father    Diabetes Father    Parkinson's disease Father    Breast cancer Sister    Neurologic Disorder Brother    Glaucoma Maternal Grandmother    Heart disease Other     Social History   Socioeconomic History   Marital status: Married    Spouse name: Not on file   Number of children: 2   Years of education: Not on file   Highest education level: Not on file  Occupational History   Occupation: homemaker  Tobacco Use   Smoking status: Never   Smokeless tobacco: Never  Vaping Use   Vaping Use: Never used  Substance and Sexual Activity   Alcohol use: Never   Drug use: Never   Sexual activity: Not on file  Other Topics Concern   Not on file  Social History Narrative   Not on file   Social Determinants of Health   Financial Resource  Strain: Low Risk    Difficulty of Paying Living Expenses: Not hard at all  Food Insecurity: No Food Insecurity   Worried About Charity fundraiser in the Last Year: Never true   [place] in the Last Year: Never true  Transportation Needs: No Transportation Needs   Lack of Transportation (Medical): No   Lack of Transportation (Non-Medical): No  Physical Activity: Insufficiently Active   Days of Exercise per Week: 4 days   Minutes of Exercise per Session: 20 min  Stress: No Stress Concern Present   Feeling of Stress : Only a little  Social Connections: Engineer, building services of Communication with Friends and Family: More than three times a week   Frequency of Social Gatherings with Friends and Family: More than three times a week   Attends Religious Services: More than 4 times per year   Active Member of [employer] or Organizations: Yes   Attends Music therapist: More than 4 times per year   Marital Status: Married  Human resources officer Violence: Not At Risk   Fear of Current or Ex-Partner: No   Emotionally Abused: No   Physically Abused: No   Sexually Abused: No  Outpatient Medications Prior to Visit  Medication Sig Dispense Refill   Accu-Chek Softclix Lancets lancets CHECK FASTING BLOOD SUGAR ONCE DAILY 100 each 3   albuterol (VENTOLIN HFA) 108 (90 Base) MCG/ACT inhaler INHALE 2 PUFFS INTO THE LUNGS EVERY 6 HOURS AS NEEDED FOR WHEEZING OR SHORTNESS OF BREATH 8.5 g 1   atenolol (TENORMIN) 25 MG tablet TAKE 1 TABLET(25 MG) BY MOUTH DAILY 90 tablet 1   blood glucose meter kit and supplies KIT Check sugars fasting. E11.69 1 each 0   BREZTRI AEROSPHERE 160-9-4.8 MCG/ACT AERO INHALE 2 PUFFS INTO THE LUNGS IN THE MORNING AND AT BEDTIME 10.7 g 0   Cyanocobalamin (VITAMIN B 12 PO) Take 5,000 mcg by mouth daily. B-12 sublingual drops     estradiol (ESTRACE) 0.1 MG/GM vaginal cream Monday/Wednesday/Friday in the evening     fluticasone (FLONASE) 50 MCG/ACT nasal spray  SHAKE LIQUID AND USE 2 SPRAYS IN EACH NOSTRIL DAILY 16 g 6   glucose blood (ACCU-CHEK GUIDE) test strip USE TO CHECK FASTING BLOOD SUGAR AS DIRECTED 50 strip 4   hydrALAZINE (APRESOLINE) 25 MG tablet TAKE 1 TABLET(25 MG) BY MOUTH IN THE MORNING AND AT BEDTIME 180 tablet 0   Ixekizumab (TALTZ) 80 MG/ML SOAJ Inject into the skin. Every 2 weeks. Done by Payton Mccallum.     levocetirizine (XYZAL) 5 MG tablet Take 5 mg by mouth every evening.     levothyroxine (SYNTHROID) 150 MCG tablet TAKE 1 TABLET(150 MCG) BY MOUTH DAILY 90 tablet 1   meloxicam (MOBIC) 15 MG tablet Take 15 mg by mouth daily.     metFORMIN (GLUCOPHAGE) 1000 MG tablet TAKE 1 TABLET(1000 MG) BY MOUTH TWICE DAILY WITH A MEAL 180 tablet 0   montelukast (SINGULAIR) 10 MG tablet Take 1 tablet (10 mg total) by mouth at bedtime. 30 tablet 11   Multiple Vitamins-Minerals (SENIOR MULTIVITAMIN PLUS PO) Take 1 tablet by mouth daily.     Omega-3 1000 MG CAPS Take 1 capsule by mouth daily.     ondansetron (ZOFRAN-ODT) 4 MG disintegrating tablet Take 4 mg by mouth 3 (three) times daily as needed.     tamsulosin (FLOMAX) 0.4 MG CAPS capsule Take 0.4 mg by mouth 2 (two) times daily.     traMADol (ULTRAM) 50 MG tablet Take 1 tablet (50 mg total) by mouth every 6 (six) hours as needed for moderate pain or severe pain. 30 tablet 1   UNABLE TO FIND Med Name: D-Mannose Powder - 1 teaspoonful in cup of water     valsartan-hydrochlorothiazide (DIOVAN-HCT) 320-25 MG tablet Take 1 tablet by mouth in the morning. 90 tablet 3   zolpidem (AMBIEN) 10 MG tablet TAKE 1 TABLET(10 MG) BY MOUTH AT BEDTIME (Patient taking differently: Take 10 mg by mouth at bedtime.) 90 tablet 1   No facility-administered medications prior to visit.    Allergies  Allergen Reactions   Crestor [Rosuvastatin]     myalgia   Fenofibrate     myalgia   Jardiance [Empagliflozin]     Nausea.   Lipitor [Atorvastatin]     myalgia    Review of Systems  Constitutional:  Positive for appetite  change (decreased), fatigue and unexpected weight change (weight loss). Negative for fever.  HENT:  Negative for congestion, ear pain, sinus pressure and sore throat.   Eyes:  Negative for pain.  Respiratory:  Negative for cough, chest tightness, shortness of breath and wheezing.   Cardiovascular:  Negative for chest pain and palpitations.  Gastrointestinal:  Positive for  diarrhea and nausea. Negative for abdominal pain, constipation and vomiting.  Endocrine: Negative.   Genitourinary:  Negative for dysuria and hematuria.  Musculoskeletal:  Negative for arthralgias, back pain, joint swelling and myalgias.  Skin:  Negative for rash.  Allergic/Immunologic: Positive for environmental allergies.  Neurological:  Negative for dizziness, weakness and headaches.  Hematological: Negative.   Psychiatric/Behavioral:  Negative for dysphoric mood. The patient is not nervous/anxious.       Objective:    Physical Exam Vitals reviewed.  Constitutional:      Appearance: Normal appearance.  HENT:     Mouth/Throat:     Mouth: Mucous membranes are moist.  Cardiovascular:     Rate and Rhythm: Normal rate and regular rhythm.     Pulses: Normal pulses.     Heart sounds: Normal heart sounds.  Pulmonary:     Effort: Pulmonary effort is normal.     Breath sounds: Normal breath sounds.  Abdominal:     General: Bowel sounds are normal.     Palpations: Abdomen is soft.     Tenderness: There is abdominal tenderness (lower left and right quadrants with palpation). There is no right CVA tenderness or left CVA tenderness.     Comments: Hyperactive bowel sounds  Musculoskeletal:     Cervical back: Neck supple.  Skin:    General: Skin is warm and dry.  Neurological:     General: No focal deficit present.     Mental Status: She is alert and oriented to person, place, and time.  Psychiatric:        Mood and Affect: Mood normal.        Behavior: Behavior normal.        Thought Content: Thought content  normal.        Judgment: Judgment normal.    BP 140/68 (BP Location: Left Arm, Patient Position: Sitting)   Pulse 66   Temp (!) 96.7 F (35.9 C) (Temporal)   Ht _0  (1.727 m)   Wt 265 lb (120.2 kg)   SpO2 94%   BMI 40.29 kg/m  Wt Readings from Last 3 Encounters:  11/18/20 265 lb (120.2 kg)  11/01/20 293 lb (132.9 kg)  10/28/20 290 lb (131.5 kg)    Health Maintenance Due  Topic Date Due   Hepatitis C Screening  Never done   TETANUS/TDAP  Never done   OPHTHALMOLOGY EXAM  08/22/2020   COVID-19 Vaccine (5 - Booster for Moderna series) 11/19/2020       Lab Results  Component Value Date   TSH 3.510 01/17/2020   Lab Results  Component Value Date   WBC 10.2 10/28/2020   HGB 11.7 10/28/2020   HCT 36.3 10/28/2020   MCV 93 10/28/2020   PLT 235 10/28/2020   Lab Results  Component Value Date   NA 138 10/28/2020   K 4.1 10/28/2020   CO2 27 10/28/2020   GLUCOSE 93 10/28/2020   BUN 16 10/28/2020   CREATININE 0.71 10/28/2020   BILITOT 0.7 10/28/2020   ALKPHOS 75 10/28/2020   AST 25 10/28/2020   ALT 22 10/28/2020   PROT 6.3 10/28/2020   ALBUMIN 3.9 10/28/2020   CALCIUM 9.2 10/28/2020   EGFR 92 10/28/2020   Lab Results  Component Value Date   CHOL 146 10/28/2020   Lab Results  Component Value Date   HDL 53 10/28/2020   Lab Results  Component Value Date   LDLCALC 76 10/28/2020   Lab Results  Component Value Date  TRIG 89 10/28/2020   Lab Results  Component Value Date   CHOLHDL 2.8 10/28/2020   Lab Results  Component Value Date   HGBA1C 5.9 (H) 10/28/2020         Assessment & Plan:   1. Diarrhea, unspecified type - CBC with Differential/Platelet - Comprehensive metabolic panel - diphenoxylate-atropine (LOMOTIL) 2.5-0.025 MG tablet; Take 1 tablet by mouth 4 (four) times daily as needed for diarrhea or loose stools.  Dispense: 30 tablet; Refill: 0 - TSH  2. Recent unexplained weight loss - CBC with Differential/Platelet - Comprehensive  metabolic panel - TSH  3. Nausea - CBC with Differential/Platelet - Comprehensive metabolic panel - ondansetron (ZOFRAN-ODT) 4 MG disintegrating tablet; Take 1 tablet (4 mg total) by mouth every 8 (eight) hours as needed.  Dispense: 30 tablet; Refill: 1     Return stool studies to office  Hold Metformin  Take Lomotil for diarrhea up to 4 times daily Take Zofran 4 mg as needed for nausea Push fluids  Follow-up: 2 weeks, weight loss   I,Lauren M Auman,acting as a Education administrator for [employer], NP.,have documented all relevant documentation on the behalf of Rip Harbour, NP,as directed by  Rip Harbour, NP while in the presence of Rip Harbour, NP.   I, Rip Harbour, NP, have reviewed all documentation for this visit. The documentation on 11/18/20 for the exam, diagnosis, procedures, and orders are all accurate and complete.     Signed,  [name], DNP 11/18/20 at 10:11 PM

## 2020-11-18 NOTE — Patient Instructions (Addendum)
Return stool studies to office  Hold Metformin  Take Lomotil for diarrhea up to 4 times daily Take Zofran 4 mg as needed for nausea Push fluids Follow-up 2 weeks, weight loss    Food Choices to Help Relieve Diarrhea, Adult Diarrhea can make you feel weak and cause you to become dehydrated. It is important to choose the right foods and drinks to: Relieve diarrhea. Replace lost fluids and nutrients. Prevent dehydration. What are tips for following this plan? Relieving diarrhea Avoid foods that make your diarrhea worse. These may include: Foods and beverages sweetened with high-fructose corn syrup, honey, or sweeteners such as xylitol, sorbitol, and mannitol. Fried, greasy, or spicy foods. Raw fruits and vegetables. Eat foods that are rich in probiotics. These include foods such as yogurt and fermented milk products. Probiotics can help increase healthy bacteria in your stomach and intestines (gastrointestinal tract or GI tract). This may help digestion and stop diarrhea. If you have lactose intolerance, avoid dairy products. These may make your diarrhea worse. Take medicine to help stop diarrhea only as told by your health care provider. Replacing nutrients  Eat bland, easy-to-digest foods in small amounts as you are able, until your diarrhea starts to get better. These foods include bananas, applesauce, rice, toast, and crackers. Gradually reintroduce nutrient-rich foods as tolerated or as told by your health care provider. This includes: Well-cooked protein foods, such as eggs, lean meats like fish or chicken without skin, and tofu. Peeled, seeded, and soft-cooked fruits and vegetables. Low-fat dairy products. Whole grains. Take vitamin and mineral supplements as told by your health care provider. Preventing dehydration  Start by sipping water or a solution to prevent dehydration (oral rehydration solution, ORS). This is a drink that helps replace fluids and minerals your body has  lost. You can buy an ORS at pharmacies and retail stores. Try to drink at least 8-10 cups (2,000-2,500 mL) of fluid each day to help replace lost fluids. If you have urine that is pale yellow, you are getting enough fluids. You may drink other liquids in addition to water, such as fruit juice that you have added water to (diluted fruit juice) or low-calorie sports drinks, as tolerated or as told by your health care provider. Avoid drinks with caffeine, such as coffee, tea, or soft drinks. Avoid alcohol. Summary When you have diarrhea, it is important to choose the right foods and drinks to relieve diarrhea, to replace lost fluids and nutrients, and to prevent dehydration. Make sure you drink enough fluid to keep your urine pale yellow. You may benefit from eating bland foods at first. Gradually reintroduce healthy, nutrient-rich foods as tolerated or as told by your health care provider. Avoid foods that make your diarrhea worse, such as fried, greasy, or spicy foods. This information is not intended to replace advice given to you by your health care provider. Make sure you discuss any questions you have with your health care provider. Document Revised: 04/04/2019 Document Reviewed: 04/04/2019 Elsevier Patient Education  2022 [place].   Diarrhea, Adult Diarrhea is when you pass loose and watery poop (stool) often. Diarrhea can make you feel weak and cause you to lose water in your body (get dehydrated). Losing water in your body can cause you to: Feel tired and thirsty. Have a dry mouth. Go pee (urinate) less often. Diarrhea often lasts 2-3 days. However, it can last longer if it is a sign of something more serious. It is important to treat your diarrhea as told by your  doctor. Follow these instructions at home: Eating and drinking   Follow these instructions as told by your doctor: Take an ORS (oral rehydration solution). This is a drink that helps you replace fluids and minerals your  body lost. It is sold at pharmacies and stores. Drink plenty of fluids, such as: Water. Ice chips. Diluted fruit juice. Low-calorie sports drinks. Milk, if you want. Avoid drinking fluids that have a lot of sugar or caffeine in them. Eat bland, easy-to-digest foods in small amounts as you are able. These foods include: Bananas. Applesauce. Rice. Low-fat (lean) meats. Toast. Crackers. Avoid alcohol. Avoid spicy or fatty foods.  Medicines Take over-the-counter and prescription medicines only as told by your doctor. If you were prescribed an antibiotic medicine, take it as told by your doctor. Do not stop using the antibiotic even if you start to feel better. General instructions  Wash your hands often using soap and water. If soap and water are not available, use a hand sanitizer. Others in your home should wash their hands as well. Hands should be washed: After using the toilet or changing a diaper. Before preparing, cooking, or serving food. While caring for a sick person. While visiting someone in a hospital. Drink enough fluid to keep your pee (urine) pale yellow. Rest at home while you get better. Watch your condition for any changes. Take a warm bath to help with any burning or pain from having diarrhea. Keep all follow-up visits as told by your doctor. This is important. Contact a doctor if: You have a fever. Your diarrhea gets worse. You have new symptoms. You cannot keep fluids down. You feel light-headed or dizzy. You have a headache. You have muscle cramps. Get help right away if: You have chest pain. You feel very weak or you pass out (faint). You have bloody or black poop or poop that looks like tar. You have very bad pain, cramping, or bloating in your belly (abdomen). You have trouble breathing or you are breathing very quickly. Your heart is beating very quickly. Your skin feels cold and clammy. You feel confused. You have signs of losing too much water  in your body, such as: Dark pee, very little pee, or no pee. Cracked lips. Dry mouth. Sunken eyes. Sleepiness. Weakness. Summary Diarrhea is when you pass loose and watery poop (stool) often. Diarrhea can make you feel weak and cause you to lose water in your body (get dehydrated). Take an ORS (oral rehydration solution). This is a drink that is sold at pharmacies and stores. Eat bland, easy-to-digest foods in small amounts as you are able. Contact a doctor if your condition gets worse. Get help right away if you have signs that you have lost too much water in your body. This information is not intended to replace advice given to you by your health care provider. Make sure you discuss any questions you have with your health care provider. Document Revised: 07/23/2017 Document Reviewed: 07/23/2017 Elsevier Patient Education  [place].

## 2020-11-19 ENCOUNTER — Other Ambulatory Visit: Payer: Self-pay | Admitting: Nurse Practitioner

## 2020-11-19 ENCOUNTER — Other Ambulatory Visit: Payer: Self-pay

## 2020-11-19 DIAGNOSIS — G4700 Insomnia, unspecified: Secondary | ICD-10-CM | POA: Diagnosis not present

## 2020-11-19 DIAGNOSIS — K5730 Diverticulosis of large intestine without perforation or abscess without bleeding: Secondary | ICD-10-CM | POA: Diagnosis not present

## 2020-11-19 DIAGNOSIS — Z79899 Other long term (current) drug therapy: Secondary | ICD-10-CM | POA: Diagnosis not present

## 2020-11-19 DIAGNOSIS — I10 Essential (primary) hypertension: Secondary | ICD-10-CM | POA: Diagnosis not present

## 2020-11-19 DIAGNOSIS — E079 Disorder of thyroid, unspecified: Secondary | ICD-10-CM | POA: Diagnosis not present

## 2020-11-19 DIAGNOSIS — Z8616 Personal history of COVID-19: Secondary | ICD-10-CM | POA: Diagnosis not present

## 2020-11-19 DIAGNOSIS — D72828 Other elevated white blood cell count: Secondary | ICD-10-CM

## 2020-11-19 DIAGNOSIS — Z20822 Contact with and (suspected) exposure to covid-19: Secondary | ICD-10-CM | POA: Diagnosis not present

## 2020-11-19 DIAGNOSIS — R1032 Left lower quadrant pain: Secondary | ICD-10-CM | POA: Diagnosis not present

## 2020-11-19 DIAGNOSIS — R590 Localized enlarged lymph nodes: Secondary | ICD-10-CM | POA: Diagnosis not present

## 2020-11-19 DIAGNOSIS — E871 Hypo-osmolality and hyponatremia: Secondary | ICD-10-CM | POA: Diagnosis not present

## 2020-11-19 DIAGNOSIS — R634 Abnormal weight loss: Secondary | ICD-10-CM | POA: Diagnosis not present

## 2020-11-19 DIAGNOSIS — R933 Abnormal findings on diagnostic imaging of other parts of digestive tract: Secondary | ICD-10-CM | POA: Diagnosis not present

## 2020-11-19 DIAGNOSIS — K5901 Slow transit constipation: Secondary | ICD-10-CM | POA: Diagnosis not present

## 2020-11-19 DIAGNOSIS — R197 Diarrhea, unspecified: Secondary | ICD-10-CM | POA: Diagnosis not present

## 2020-11-19 DIAGNOSIS — E119 Type 2 diabetes mellitus without complications: Secondary | ICD-10-CM | POA: Diagnosis not present

## 2020-11-19 LAB — COMPREHENSIVE METABOLIC PANEL
ALT: 12 IU/L (ref 0–32)
AST: 15 IU/L (ref 0–40)
Albumin/Globulin Ratio: 1.6 (ref 1.2–2.2)
Albumin: 4.5 g/dL (ref 3.8–4.8)
Alkaline Phosphatase: 84 IU/L (ref 44–121)
BUN/Creatinine Ratio: 12 (ref 12–28)
BUN: 10 mg/dL (ref 8–27)
Bilirubin Total: 1.4 mg/dL — ABNORMAL HIGH (ref 0.0–1.2)
CO2: 30 mmol/L — ABNORMAL HIGH (ref 20–29)
Calcium: 9.9 mg/dL (ref 8.7–10.3)
Chloride: 85 mmol/L — ABNORMAL LOW (ref 96–106)
Creatinine, Ser: 0.84 mg/dL (ref 0.57–1.00)
Globulin, Total: 2.8 g/dL (ref 1.5–4.5)
Glucose: 102 mg/dL — ABNORMAL HIGH (ref 65–99)
Potassium: 3.9 mmol/L (ref 3.5–5.2)
Sodium: 129 mmol/L — ABNORMAL LOW (ref 134–144)
Total Protein: 7.3 g/dL (ref 6.0–8.5)
eGFR: 75 mL/min/{1.73_m2} (ref >59)

## 2020-11-19 LAB — CBC WITH DIFFERENTIAL/PLATELET
Basophils Absolute: 0.1 10*3/uL (ref 0.0–0.2)
Basos: 1 %
EOS (ABSOLUTE): 0.1 10*3/uL (ref 0.0–0.4)
Eos: 1 %
Hematocrit: 44.5 % (ref 34.0–46.6)
Hemoglobin: 14.6 g/dL (ref 11.1–15.9)
Immature Grans (Abs): 0 10*3/uL (ref 0.0–0.1)
Immature Granulocytes: 0 %
Lymphocytes Absolute: 1.7 10*3/uL (ref 0.7–3.1)
Lymphs: 14 %
MCH: 29.1 pg (ref 26.6–33.0)
MCHC: 32.8 g/dL (ref 31.5–35.7)
MCV: 89 fL (ref 79–97)
Monocytes Absolute: 1 10*3/uL — ABNORMAL HIGH (ref 0.1–0.9)
Monocytes: 9 %
Neutrophils Absolute: 9.1 10*3/uL — ABNORMAL HIGH (ref 1.4–7.0)
Neutrophils: 75 %
Platelets: 306 10*3/uL (ref 150–450)
RBC: 5.02 x10E6/uL (ref 3.77–5.28)
RDW: 14 % (ref 11.7–15.4)
WBC: 12.1 10*3/uL — ABNORMAL HIGH (ref 3.4–10.8)

## 2020-11-20 DIAGNOSIS — Z8616 Personal history of COVID-19: Secondary | ICD-10-CM | POA: Diagnosis not present

## 2020-11-20 DIAGNOSIS — K5900 Constipation, unspecified: Secondary | ICD-10-CM | POA: Diagnosis not present

## 2020-11-20 DIAGNOSIS — I10 Essential (primary) hypertension: Secondary | ICD-10-CM | POA: Diagnosis not present

## 2020-11-20 DIAGNOSIS — G20 Parkinson's disease: Secondary | ICD-10-CM | POA: Insufficient documentation

## 2020-11-20 DIAGNOSIS — R634 Abnormal weight loss: Secondary | ICD-10-CM | POA: Diagnosis not present

## 2020-11-20 LAB — TSH: TSH: 1.5 u[IU]/mL (ref 0.450–4.500)

## 2020-11-20 LAB — SPECIMEN STATUS REPORT

## 2020-11-22 ENCOUNTER — Ambulatory Visit (INDEPENDENT_AMBULATORY_CARE_PROVIDER_SITE_OTHER): Payer: Medicare Other | Admitting: Bariatrics

## 2020-11-25 ENCOUNTER — Other Ambulatory Visit: Payer: Medicare Other

## 2020-12-03 ENCOUNTER — Ambulatory Visit: Payer: Medicare Other | Admitting: Family Medicine

## 2020-12-05 ENCOUNTER — Other Ambulatory Visit: Payer: Self-pay | Admitting: Nurse Practitioner

## 2020-12-05 DIAGNOSIS — J301 Allergic rhinitis due to pollen: Secondary | ICD-10-CM

## 2020-12-09 NOTE — Progress Notes (Deleted)
   [address] [place] [address] Dept: [phone]  FOLLOW UP NOTE  Patient ID: Whitney Eaton, female    DOB: 12-01-1950  Age: 70 y.o. MRN: [id number] Date of Office Visit: 12/10/2020  Assessment  Chief Complaint: No chief complaint on file.  HPI Whitney Eaton is a 70 year old female who presents the clinic for follow-up visit.  She was last seen in this clinic on 09/03/2020 by Dr. Nelva Bush for evaluation of asthma and chronic rhinitis.  Her last environmental allergy testing was via blood work on 09/03/2020 and was negative to the panel.   Drug Allergies:  Allergies  Allergen Reactions   Crestor [Rosuvastatin]     myalgia   Fenofibrate     myalgia   Jardiance [Empagliflozin]     Nausea.   Lipitor [Atorvastatin]     myalgia    Physical Exam: There were no vitals taken for this visit.   Physical Exam  Diagnostics:    Assessment and Plan: No diagnosis found.  No orders of the defined types were placed in this encounter.   There are no Patient Instructions on file for this visit.  No follow-ups on file.    Thank you for the opportunity to care for this patient.  Please do not hesitate to contact me with questions.  Gareth Morgan, FNP [facility]

## 2020-12-10 ENCOUNTER — Ambulatory Visit: Payer: Medicare Other | Admitting: Family Medicine

## 2020-12-10 ENCOUNTER — Ambulatory Visit: Payer: Medicare Other | Admitting: Allergy

## 2020-12-10 ENCOUNTER — Ambulatory Visit (INDEPENDENT_AMBULATORY_CARE_PROVIDER_SITE_OTHER): Payer: Medicare Other | Admitting: Family Medicine

## 2020-12-10 ENCOUNTER — Encounter: Payer: Self-pay | Admitting: Family Medicine

## 2020-12-10 ENCOUNTER — Other Ambulatory Visit: Payer: Self-pay

## 2020-12-10 VITALS — BP 136/78 | HR 64 | Temp 97.3°F | Ht 68.0 in | Wt 257.0 lb

## 2020-12-10 DIAGNOSIS — E871 Hypo-osmolality and hyponatremia: Secondary | ICD-10-CM | POA: Diagnosis not present

## 2020-12-10 DIAGNOSIS — R197 Diarrhea, unspecified: Secondary | ICD-10-CM | POA: Insufficient documentation

## 2020-12-10 DIAGNOSIS — E440 Moderate protein-calorie malnutrition: Secondary | ICD-10-CM | POA: Diagnosis not present

## 2020-12-10 DIAGNOSIS — K582 Mixed irritable bowel syndrome: Secondary | ICD-10-CM

## 2020-12-10 MED ORDER — RIFAXIMIN 550 MG PO TABS: 550.0000 mg | ORAL_TABLET | Freq: Three times a day (TID) | ORAL | 0 refills | Status: DC

## 2020-12-10 NOTE — Progress Notes (Signed)
Acute Office Visit  Subjective:    Patient ID: Whitney Eaton, female    DOB: [birth year], 70 y.o.   MRN: [id number]  Chief Complaint  Patient presents with   Diarrhea    Still having since the beginning of September   Patient is in today for reevaluation of persistent diarrhea, nausea, anorexia, and weight loss.  Diarrhea, nausea, and unexplained weight loss.  Patient has lost 37 pounds since September 2.  She has been eating a brat diet.  The symptoms came on 2 days after getting the pneumonia and the flu shot on September 2.  She was seen at the urgent care on 11/11/20 and was given Zofran for nausea and started the bland diet.  She then followed up with Whitney Belfast, NP on 11/18/2020 at our office.  At that time, Whitney Eaton held her metformin.  Ordered stool studies.  Recommend Lomotil for diarrhea up to 4 times a day.  Recommended continue Zofran for nausea and recommended she push fluids.  On September 20 Whitney Eaton sent her to the emergency department due to worsening symptoms.  She was seen at [facility].  CT scan of her abdomen pelvis showed a moderate colonic stool burden, as well as a small to moderate sized rectal stool ball.  Diverticuli are present but no diverticulitis.   Recommendations from the emergency department include: "A single enema led to large volume stool with foul smell. Her appetite improved. She had improved energy and was able to ambulate in the halls of the ED without dizziness or fatigue.  - Maintain Daily soft, large, satisfying stools with minimal gas or smell. - start daily 800-1240m, 1-2 timse daily magnesium as primary daily laxative & magnesium supplement. - Senna 2 tablets, 1-2 times daily as needed for slow transit. - Miralax 1-3 capfuls, 1-2 times daily as needed for harder stools. - [facility] 9/26 already scheduled with PCP"  She returns today for reevaluation and is not feeling much  better.  Patient was found to high hypomagnesemia.  This was replaced in the emergency department.  She has not continued the magnesium that was recommended as an outpatient.  She continues to have issues with eating.  She develops a lot of gaseous pain and small amounts of loose stool about 1 to 2 hours after eating.  She is continued her bland diet and weight loss.  Sometimes she even feels as if the gaseous is coming up as reflux.  Patient is taking MiraLAX and Peri-Colace every other day.  She is very tired and feels weak.  Stamina is significantly decreased.    Diarrhea  Associated symptoms include abdominal pain. Pertinent negatives include no arthralgias, chills, coughing, fever, headaches, myalgias or vomiting.    Past Medical History:  Diagnosis Date   Asthma    Diabetes [place]    Hyperlipemia    Hypertension    Psoriasis    Sleep apnea    Thyroid disease     Past Surgical History:  Procedure Laterality Date   ABDOMINAL HYSTERECTOMY     CHOLECYSTECTOMY  2011   HYSTERECTOMY ABDOMINAL WITH SALPINGECTOMY     OOPHORECTOMY  1980    Family History  Problem Relation Age of Onset   Alzheimer's disease Mother    Breast cancer Mother    Arthritis Mother    Diabetes Mother    Eczema Father    CAD Father    Diabetes Father    Parkinson's disease Father  Breast cancer Sister    Neurologic Disorder Brother    Glaucoma Maternal Grandmother    Heart disease Other     Social History   Socioeconomic History   Marital status: Married    Spouse name: Not on file   Number of children: 2   Years of education: Not on file   Highest education level: Not on file  Occupational History   Occupation: homemaker  Tobacco Use   Smoking status: Never   Smokeless tobacco: Never  Vaping Use   Vaping Use: Never used  Substance and Sexual Activity   Alcohol use: Never   Drug use: Never   Sexual activity: Not on file  Other Topics Concern   Not on file  Social History Narrative    Not on file   Social Determinants of Health   Financial Resource Strain: Low Risk    Difficulty of Paying Living Expenses: Not hard at all  Food Insecurity: No Food Insecurity   Worried About Charity fundraiser in the Last Year: Never true   [place] in the Last Year: Never true  Transportation Needs: No Transportation Needs   Lack of Transportation (Medical): No   Lack of Transportation (Non-Medical): No  Physical Activity: Insufficiently Active   Days of Exercise per Week: 4 days   Minutes of Exercise per Session: 20 min  Stress: No Stress Concern Present   Feeling of Stress : Only a little  Social Connections: Engineer, building services of Communication with Friends and Family: More than three times a week   Frequency of Social Gatherings with Friends and Family: More than three times a week   Attends Religious Services: More than 4 times per year   Active Member of [employer] or Organizations: Yes   Attends Music therapist: More than 4 times per year   Marital Status: Married  Human resources officer Violence: Not At Risk   Fear of Current or Ex-Partner: No   Emotionally Abused: No   Physically Abused: No   Sexually Abused: No    Outpatient Medications Prior to Visit  Medication Sig Dispense Refill   Accu-Chek Softclix Lancets lancets CHECK FASTING BLOOD SUGAR ONCE DAILY 100 each 3   albuterol (VENTOLIN HFA) 108 (90 Base) MCG/ACT inhaler INHALE 2 PUFFS INTO THE LUNGS EVERY 6 HOURS AS NEEDED FOR WHEEZING OR SHORTNESS OF BREATH 8.5 g 1   atenolol (TENORMIN) 25 MG tablet TAKE 1 TABLET(25 MG) BY MOUTH DAILY 90 tablet 1   blood glucose meter kit and supplies KIT Check sugars fasting. E11.69 1 each 0   BREZTRI AEROSPHERE 160-9-4.8 MCG/ACT AERO INHALE 2 PUFFS INTO THE LUNGS IN THE MORNING AND AT BEDTIME 10.7 g 0   Cyanocobalamin (VITAMIN B 12 PO) Take 5,000 mcg by mouth daily. B-12 sublingual drops     estradiol (ESTRACE) 0.1 MG/GM vaginal cream  Monday/Wednesday/Friday in the evening     fluticasone (FLONASE) 50 MCG/ACT nasal spray SHAKE LIQUID AND USE 2 SPRAYS IN EACH NOSTRIL DAILY 16 g 6   glucose blood (ACCU-CHEK GUIDE) test strip USE TO CHECK FASTING BLOOD SUGAR AS DIRECTED 50 strip 4   hydrALAZINE (APRESOLINE) 25 MG tablet TAKE 1 TABLET(25 MG) BY MOUTH IN THE MORNING AND AT BEDTIME 180 tablet 0   Ixekizumab (TALTZ) 80 MG/ML SOAJ Inject into the skin. Every 2 weeks. Done by Payton Mccallum.     levocetirizine (XYZAL) 5 MG tablet Take 5 mg by mouth every evening.  levothyroxine (SYNTHROID) 150 MCG tablet TAKE 1 TABLET(150 MCG) BY MOUTH DAILY 90 tablet 1   loratadine (CLARITIN) 10 MG tablet TAKE 1 TABLET(10 MG) BY MOUTH DAILY 90 tablet 1   meloxicam (MOBIC) 15 MG tablet Take 15 mg by mouth daily.     montelukast (SINGULAIR) 10 MG tablet Take 1 tablet (10 mg total) by mouth at bedtime. 30 tablet 11   Multiple Vitamins-Minerals (SENIOR MULTIVITAMIN PLUS PO) Take 1 tablet by mouth daily.     Omega-3 1000 MG CAPS Take 1 capsule by mouth daily.     ondansetron (ZOFRAN-ODT) 4 MG disintegrating tablet Take 1 tablet (4 mg total) by mouth every 8 (eight) hours as needed. 30 tablet 1   tamsulosin (FLOMAX) 0.4 MG CAPS capsule Take 0.4 mg by mouth 2 (two) times daily.     traMADol (ULTRAM) 50 MG tablet Take 1 tablet (50 mg total) by mouth every 6 (six) hours as needed for moderate pain or severe pain. 30 tablet 1   UNABLE TO FIND Med Name: D-Mannose Powder - 1 teaspoonful in cup of water     valsartan-hydrochlorothiazide (DIOVAN-HCT) 320-25 MG tablet Take 1 tablet by mouth in the morning. 90 tablet 3   zolpidem (AMBIEN) 10 MG tablet TAKE 1 TABLET(10 MG) BY MOUTH AT BEDTIME (Patient taking differently: Take 10 mg by mouth at bedtime.) 90 tablet 1   diphenoxylate-atropine (LOMOTIL) 2.5-0.025 MG tablet Take 1 tablet by mouth 4 (four) times daily as needed for diarrhea or loose stools. (Patient not taking: Reported on 12/10/2020) 30 tablet 0   MAGNESIUM-OXIDE  400 (240 Mg) MG tablet Take by mouth 2 (two) times daily. (Patient not taking: Reported on 12/10/2020)     metFORMIN (GLUCOPHAGE) 1000 MG tablet TAKE 1 TABLET(1000 MG) BY MOUTH TWICE DAILY WITH A MEAL (Patient not taking: Reported on 12/10/2020) 180 tablet 0   No facility-administered medications prior to visit.    Allergies  Allergen Reactions   Crestor [Rosuvastatin]     myalgia   Fenofibrate     myalgia   Jardiance [Empagliflozin]     Nausea.   Lipitor [Atorvastatin]     myalgia    Review of Systems  Constitutional:  Positive for appetite change, fatigue and unexpected weight change (Weight loss). Negative for chills and fever.  HENT:  Negative for congestion, ear pain, sinus pressure and sore throat.   Eyes:  Negative for pain.  Respiratory:  Negative for cough, chest tightness, shortness of breath and wheezing.   Cardiovascular:  Negative for chest pain and palpitations.  Gastrointestinal:  Positive for abdominal pain, diarrhea and nausea. Negative for constipation and vomiting.  Genitourinary:  Negative for dysuria and hematuria.  Musculoskeletal:  Negative for arthralgias, back pain, joint swelling and myalgias.  Skin:  Negative for rash.  Neurological:  Negative for dizziness, weakness and headaches.  Psychiatric/Behavioral:  Negative for dysphoric mood. The patient is not nervous/anxious.       Objective:    Physical Exam Vitals reviewed.  Constitutional:      Appearance: She is ill-appearing (Disheveled.  Appears exhausted.).  Cardiovascular:     Rate and Rhythm: Normal rate and regular rhythm.     Heart sounds: Normal heart sounds.  Pulmonary:     Effort: Pulmonary effort is normal.     Breath sounds: Normal breath sounds.  Abdominal:     Palpations: Abdomen is soft.     Tenderness: There is abdominal tenderness (Mild diffuse). There is no guarding or rebound.  Comments: Hyperactive bowel sounds  Neurological:     Mental Status: She is alert and oriented  to person, place, and time.  Psychiatric:        Mood and Affect: Mood normal.        Behavior: Behavior normal.    BP 136/78 (BP Location: Right Arm, Patient Position: Sitting)   Pulse 64   Temp (!) 97.3 F (36.3 C) (Temporal)   Ht 5' 8"  (1.727 m)   Wt 257 lb (116.6 kg)   SpO2 97%   BMI 39.08 kg/m  Wt Readings from Last 3 Encounters:  12/10/20 257 lb (116.6 kg)  11/18/20 265 lb (120.2 kg)  11/01/20 293 lb (132.9 kg)    Health Maintenance Due  Topic Date Due   Hepatitis C Screening  Never done   TETANUS/TDAP  Never done   OPHTHALMOLOGY EXAM  08/22/2020   COVID-19 Vaccine (5 - Booster for Moderna series) 11/19/2020    There are no preventive care reminders to display for this patient.   Lab Results  Component Value Date   TSH 1.500 11/18/2020   Lab Results  Component Value Date   WBC 12.1 (H) 11/18/2020   HGB 14.6 11/18/2020   HCT 44.5 11/18/2020   MCV 89 11/18/2020   PLT 306 11/18/2020   Lab Results  Component Value Date   NA 129 (L) 11/18/2020   K 3.9 11/18/2020   CO2 30 (H) 11/18/2020   GLUCOSE 102 (H) 11/18/2020   BUN 10 11/18/2020   CREATININE 0.84 11/18/2020   BILITOT 1.4 (H) 11/18/2020   ALKPHOS 84 11/18/2020   AST 15 11/18/2020   ALT 12 11/18/2020   PROT 7.3 11/18/2020   ALBUMIN 4.5 11/18/2020   CALCIUM 9.9 11/18/2020   EGFR 75 11/18/2020   Lab Results  Component Value Date   CHOL 146 10/28/2020   Lab Results  Component Value Date   HDL 53 10/28/2020   Lab Results  Component Value Date   LDLCALC 76 10/28/2020   Lab Results  Component Value Date   TRIG 89 10/28/2020   Lab Results  Component Value Date   CHOLHDL 2.8 10/28/2020   Lab Results  Component Value Date   HGBA1C 5.9 (H) 10/28/2020         Assessment & Plan:   There are no diagnoses linked to this encounter.   No orders of the defined types were placed in this encounter.   I,Lauren M Auman,acting as a scribe for Rochel Brome, MD.,have documented all relevant  documentation on the behalf of Rochel Brome, MD,as directed by  Rochel Brome, MD while in the presence of Rochel Brome, MD.   Oleta Mouse, CMA

## 2020-12-10 NOTE — Patient Instructions (Signed)
Start xifaxin 550 mg one three times a day x 14 days.  Referred to Dr. Lyndel Safe.  Continue pericolace, but stop miralax .

## 2020-12-11 LAB — CBC WITH DIFFERENTIAL/PLATELET
Basophils Absolute: 0.1 10*3/uL (ref 0.0–0.2)
Basos: 1 %
EOS (ABSOLUTE): 0.2 10*3/uL (ref 0.0–0.4)
Eos: 2 %
Hematocrit: 39.9 % (ref 34.0–46.6)
Hemoglobin: 14 g/dL (ref 11.1–15.9)
Immature Grans (Abs): 0 10*3/uL (ref 0.0–0.1)
Immature Granulocytes: 0 %
Lymphocytes Absolute: 1.9 10*3/uL (ref 0.7–3.1)
Lymphs: 21 %
MCH: 30 pg (ref 26.6–33.0)
MCHC: 35.1 g/dL (ref 31.5–35.7)
MCV: 85 fL (ref 79–97)
Monocytes Absolute: 0.7 10*3/uL (ref 0.1–0.9)
Monocytes: 8 %
Neutrophils Absolute: 6 10*3/uL (ref 1.4–7.0)
Neutrophils: 68 %
Platelets: 250 10*3/uL (ref 150–450)
RBC: 4.67 x10E6/uL (ref 3.77–5.28)
RDW: 14.1 % (ref 11.7–15.4)
WBC: 8.9 10*3/uL (ref 3.4–10.8)

## 2020-12-11 LAB — COMPREHENSIVE METABOLIC PANEL
ALT: 12 IU/L (ref 0–32)
AST: 15 IU/L (ref 0–40)
Albumin/Globulin Ratio: 1.5 (ref 1.2–2.2)
Albumin: 3.8 g/dL (ref 3.8–4.8)
Alkaline Phosphatase: 85 IU/L (ref 44–121)
BUN/Creatinine Ratio: 13 (ref 12–28)
BUN: 9 mg/dL (ref 8–27)
Bilirubin Total: 0.7 mg/dL (ref 0.0–1.2)
CO2: 29 mmol/L (ref 20–29)
Calcium: 9.9 mg/dL (ref 8.7–10.3)
Chloride: 90 mmol/L — ABNORMAL LOW (ref 96–106)
Creatinine, Ser: 0.72 mg/dL (ref 0.57–1.00)
Globulin, Total: 2.5 g/dL (ref 1.5–4.5)
Glucose: 112 mg/dL — ABNORMAL HIGH (ref 70–99)
Potassium: 3.7 mmol/L (ref 3.5–5.2)
Sodium: 133 mmol/L — ABNORMAL LOW (ref 134–144)
Total Protein: 6.3 g/dL (ref 6.0–8.5)
eGFR: 90 mL/min/{1.73_m2} (ref >59)

## 2020-12-11 LAB — MAGNESIUM: Magnesium: 1.6 mg/dL (ref 1.6–2.3)

## 2020-12-12 DIAGNOSIS — E440 Moderate protein-calorie malnutrition: Secondary | ICD-10-CM | POA: Insufficient documentation

## 2020-12-12 NOTE — Assessment & Plan Note (Signed)
She has lost more than 3% of her weight in the last 5 weeks.

## 2020-12-12 NOTE — Assessment & Plan Note (Signed)
Check magnesium level 

## 2020-12-12 NOTE — Progress Notes (Signed)
Normal or stable.

## 2020-12-12 NOTE — Assessment & Plan Note (Signed)
Check chemistry panel

## 2020-12-12 NOTE — Assessment & Plan Note (Signed)
Symptoms are concerning for IBS-D. Patient needs a colonoscopy.  Refer to Dr. Lyndel Safe.

## 2020-12-12 NOTE — Assessment & Plan Note (Signed)
Start xifaxin 550 mg one three times a day x 14 days.  Referred to Dr. Lyndel Safe.  Continue pericolace, but stop miralax .

## 2020-12-16 ENCOUNTER — Telehealth: Payer: Self-pay

## 2020-12-16 NOTE — Chronic Care Management (AMB) (Signed)
Chronic Care Management Pharmacy Assistant   Name: Whitney Eaton  MRN: [id number] DOB: 1950/11/23   Reason for Encounter: Disease State/ Diabetes  Recent office visits:  10-28-2020 Lillard Anes, MD. Kenalog injection given. Reordered Tramadol 50 mg every 6 hours as needed.  11-01-2020 Rochel Brome, MD. Flu vaccine and pneumococcal given. DG bone density scan ordered.  11-18-2020 Rip Harbour, NP. Order placed for stool studies. WBC= 12.1, neutrophils absolute= 9.1, monocytes absolute= 1.0. Sodium= 102, sodium= 129, chloride= 85, CO2= 30, bilirubin= 1.4. START lomotil 2.5-0.025 mg 4 times daily as needed. HOLD metformin. Follow up in 2 weeks.  12-10-2020 Rochel Brome, MD. STOP miralax, Magnesium oxide, lomotil. START xifaxin 550 mg 3 times daily for 2 weeks.  Recent consult visits:  11-19-2020 Janeice Robinson, DO. Colonoscopy procedure  Hospital visits:  None in previous 6 months  Medications: Outpatient Encounter Medications as of 12/16/2020  Medication Sig   Accu-Chek Softclix Lancets lancets CHECK FASTING BLOOD SUGAR ONCE DAILY   albuterol (VENTOLIN HFA) 108 (90 Base) MCG/ACT inhaler INHALE 2 PUFFS INTO THE LUNGS EVERY 6 HOURS AS NEEDED FOR WHEEZING OR SHORTNESS OF BREATH   atenolol (TENORMIN) 25 MG tablet TAKE 1 TABLET(25 MG) BY MOUTH DAILY   blood glucose meter kit and supplies KIT Check sugars fasting. E11.69   BREZTRI AEROSPHERE 160-9-4.8 MCG/ACT AERO INHALE 2 PUFFS INTO THE LUNGS IN THE MORNING AND AT BEDTIME   Cyanocobalamin (VITAMIN B 12 PO) Take 5,000 mcg by mouth daily. B-12 sublingual drops   estradiol (ESTRACE) 0.1 MG/GM vaginal cream Monday/Wednesday/Friday in the evening   fluticasone (FLONASE) 50 MCG/ACT nasal spray SHAKE LIQUID AND USE 2 SPRAYS IN EACH NOSTRIL DAILY   glucose blood (ACCU-CHEK GUIDE) test strip USE TO CHECK FASTING BLOOD SUGAR AS DIRECTED   hydrALAZINE (APRESOLINE) 25 MG tablet TAKE 1 TABLET(25 MG) BY MOUTH IN THE MORNING AND  AT BEDTIME   Ixekizumab (TALTZ) 80 MG/ML SOAJ Inject into the skin. Every 2 weeks. Done by Payton Mccallum.   levocetirizine (XYZAL) 5 MG tablet Take 5 mg by mouth every evening.   levothyroxine (SYNTHROID) 150 MCG tablet TAKE 1 TABLET(150 MCG) BY MOUTH DAILY   loratadine (CLARITIN) 10 MG tablet TAKE 1 TABLET(10 MG) BY MOUTH DAILY   meloxicam (MOBIC) 15 MG tablet Take 15 mg by mouth daily.   montelukast (SINGULAIR) 10 MG tablet Take 1 tablet (10 mg total) by mouth at bedtime.   Multiple Vitamins-Minerals (SENIOR MULTIVITAMIN PLUS PO) Take 1 tablet by mouth daily.   Omega-3 1000 MG CAPS Take 1 capsule by mouth daily.   ondansetron (ZOFRAN-ODT) 4 MG disintegrating tablet Take 1 tablet (4 mg total) by mouth every 8 (eight) hours as needed.   rifaximin (XIFAXAN) 550 MG TABS tablet Take 1 tablet (550 mg total) by mouth 3 (three) times daily.   tamsulosin (FLOMAX) 0.4 MG CAPS capsule Take 0.4 mg by mouth 2 (two) times daily.   traMADol (ULTRAM) 50 MG tablet Take 1 tablet (50 mg total) by mouth every 6 (six) hours as needed for moderate pain or severe pain.   UNABLE TO FIND Med Name: D-Mannose Powder - 1 teaspoonful in cup of water   valsartan-hydrochlorothiazide (DIOVAN-HCT) 320-25 MG tablet Take 1 tablet by mouth in the morning.   zolpidem (AMBIEN) 10 MG tablet TAKE 1 TABLET(10 MG) BY MOUTH AT BEDTIME (Patient taking differently: Take 10 mg by mouth at bedtime.)   No facility-administered encounter medications on file as of 12/16/2020.   Recent Relevant Labs: Lab  Results  Component Value Date/Time   HGBA1C 5.9 (H) 10/28/2020 03:43 PM   HGBA1C 6.1 (H) 07/23/2020 02:44 PM   MICROALBUR 10 04/24/2020 06:52 PM   MICROALBUR 10 09/25/2019 11:23 AM    Kidney Function Lab Results  Component Value Date/Time   CREATININE 0.72 12/10/2020 03:57 PM   CREATININE 0.84 11/18/2020 02:49 PM   GFRNONAA 72 04/22/2020 12:00 PM   GGYIR 48 04/22/2020 12:00 PM     12-16-2020: 1st attempt left VM 12-18-2020: 2nd attempt  left VM 12-20-2020: 3rd attempt left VM   Care Gaps: Last eye exam / Retinopathy Screening? 09-04-2020 Last Annual Wellness Visit? 06-04-2020 Last Diabetic Foot Exam? 07-26-2020 Hep c screening overdue Tdap overdue Covid booster overdue last completed 07-19-2020  Star Rating Drugs:  Metformin 1000 mg- Last filled 09-17-2020 90 DS Valsartan/HCTZ 30-25 mg- 11-05-2020 90 DS  [place] Clinical Pharmacist Assistant [phone]

## 2020-12-26 DIAGNOSIS — G4733 Obstructive sleep apnea (adult) (pediatric): Secondary | ICD-10-CM | POA: Diagnosis not present

## 2021-01-29 ENCOUNTER — Ambulatory Visit: Payer: Medicare Other | Admitting: Internal Medicine

## 2021-02-05 ENCOUNTER — Encounter: Payer: Self-pay | Admitting: Family Medicine

## 2021-02-11 ENCOUNTER — Other Ambulatory Visit: Payer: Self-pay | Admitting: Physician Assistant

## 2021-02-25 ENCOUNTER — Other Ambulatory Visit: Payer: Self-pay | Admitting: Family Medicine

## 2021-02-25 DIAGNOSIS — E038 Other specified hypothyroidism: Secondary | ICD-10-CM

## 2021-02-27 NOTE — Telephone Encounter (Signed)
BONE DENSITY HAS BEEN RESCHEDULED FOR 04/09/21

## 2021-02-28 ENCOUNTER — Other Ambulatory Visit: Payer: Self-pay | Admitting: Physician Assistant

## 2021-03-04 ENCOUNTER — Other Ambulatory Visit: Payer: Self-pay | Admitting: Family Medicine

## 2021-03-05 ENCOUNTER — Encounter: Payer: Self-pay | Admitting: Family Medicine

## 2021-03-06 ENCOUNTER — Other Ambulatory Visit: Payer: Medicare Other

## 2021-03-10 ENCOUNTER — Telehealth: Payer: Self-pay

## 2021-03-10 ENCOUNTER — Ambulatory Visit: Payer: Medicare Other | Admitting: Family Medicine

## 2021-03-10 NOTE — Chronic Care Management (AMB) (Signed)
I called pt today to reschedule f/ with CPP but I had to leave a message for her to call me back to reschedule. I went on ahead and canceled the apt and will wait for pt to call back.  Elray Mcgregor, [place] Pharmacist Assistant  [phone]

## 2021-03-24 ENCOUNTER — Telehealth: Payer: Medicare Other

## 2021-03-24 ENCOUNTER — Other Ambulatory Visit: Payer: Self-pay | Admitting: Family Medicine

## 2021-03-24 DIAGNOSIS — I10 Essential (primary) hypertension: Secondary | ICD-10-CM

## 2021-03-25 ENCOUNTER — Other Ambulatory Visit: Payer: Self-pay | Admitting: Family Medicine

## 2021-04-03 ENCOUNTER — Ambulatory Visit: Payer: Medicare Other | Admitting: Nurse Practitioner

## 2021-04-14 ENCOUNTER — Other Ambulatory Visit: Payer: Medicare Other

## 2021-04-14 ENCOUNTER — Other Ambulatory Visit: Payer: Self-pay

## 2021-04-14 DIAGNOSIS — E782 Mixed hyperlipidemia: Secondary | ICD-10-CM

## 2021-04-14 DIAGNOSIS — E038 Other specified hypothyroidism: Secondary | ICD-10-CM

## 2021-04-14 DIAGNOSIS — E119 Type 2 diabetes mellitus without complications: Secondary | ICD-10-CM

## 2021-04-14 DIAGNOSIS — I10 Essential (primary) hypertension: Secondary | ICD-10-CM | POA: Diagnosis not present

## 2021-04-15 LAB — CBC WITH DIFFERENTIAL/PLATELET
Basophils Absolute: 0.1 10*3/uL (ref 0.0–0.2)
Basos: 1 %
EOS (ABSOLUTE): 0.2 10*3/uL (ref 0.0–0.4)
Eos: 2 %
Hematocrit: 40.6 % (ref 34.0–46.6)
Hemoglobin: 13.8 g/dL (ref 11.1–15.9)
Immature Grans (Abs): 0 10*3/uL (ref 0.0–0.1)
Immature Granulocytes: 0 %
Lymphocytes Absolute: 2.7 10*3/uL (ref 0.7–3.1)
Lymphs: 29 %
MCH: 31.2 pg (ref 26.6–33.0)
MCHC: 34 g/dL (ref 31.5–35.7)
MCV: 92 fL (ref 79–97)
Monocytes Absolute: 0.7 10*3/uL (ref 0.1–0.9)
Monocytes: 7 %
Neutrophils Absolute: 5.5 10*3/uL (ref 1.4–7.0)
Neutrophils: 61 %
Platelets: 245 10*3/uL (ref 150–450)
RBC: 4.43 x10E6/uL (ref 3.77–5.28)
RDW: 11.8 % (ref 11.7–15.4)
WBC: 9.2 10*3/uL (ref 3.4–10.8)

## 2021-04-15 LAB — COMPREHENSIVE METABOLIC PANEL
ALT: 14 IU/L (ref 0–32)
AST: 16 IU/L (ref 0–40)
Albumin/Globulin Ratio: 1.3 (ref 1.2–2.2)
Albumin: 3.9 g/dL (ref 3.8–4.8)
Alkaline Phosphatase: 112 IU/L (ref 44–121)
BUN/Creatinine Ratio: 14 (ref 12–28)
BUN: 10 mg/dL (ref 8–27)
Bilirubin Total: 0.6 mg/dL (ref 0.0–1.2)
CO2: 29 mmol/L (ref 20–29)
Calcium: 9.5 mg/dL (ref 8.7–10.3)
Chloride: 95 mmol/L — ABNORMAL LOW (ref 96–106)
Creatinine, Ser: 0.71 mg/dL (ref 0.57–1.00)
Globulin, Total: 3.1 g/dL (ref 1.5–4.5)
Glucose: 118 mg/dL — ABNORMAL HIGH (ref 70–99)
Potassium: 3.2 mmol/L — ABNORMAL LOW (ref 3.5–5.2)
Sodium: 139 mmol/L (ref 134–144)
Total Protein: 7 g/dL (ref 6.0–8.5)
eGFR: 91 mL/min/{1.73_m2} (ref >59)

## 2021-04-15 LAB — LIPID PANEL
Chol/HDL Ratio: 4.5 ratio — ABNORMAL HIGH (ref 0.0–4.4)
Cholesterol, Total: 211 mg/dL — ABNORMAL HIGH (ref 100–199)
HDL: 47 mg/dL (ref >39)
LDL Chol Calc (NIH): 135 mg/dL — ABNORMAL HIGH (ref 0–99)
Triglycerides: 164 mg/dL — ABNORMAL HIGH (ref 0–149)
VLDL Cholesterol Cal: 29 mg/dL (ref 5–40)

## 2021-04-15 LAB — HEMOGLOBIN A1C
Est. average glucose Bld gHb Est-mCnc: 120 mg/dL
Hgb A1c MFr Bld: 5.8 % — ABNORMAL HIGH (ref 4.8–5.6)

## 2021-04-15 LAB — CARDIOVASCULAR RISK ASSESSMENT

## 2021-04-15 LAB — TSH: TSH: 15.4 u[IU]/mL — ABNORMAL HIGH (ref 0.450–4.500)

## 2021-04-15 NOTE — Progress Notes (Signed)
Blood count normal.  Liver function normal.  Kidney function normal.  Potassium low  Thyroid function abnormal. Ask if patient has been taking synthroid 150 mcg daily. If so, I would increase to synthroid 175 mcg once daily. If not, restart synthroid 150 mcg daily.  Cholesterol: elevated. Will discuss on Thursday.  HBA1C: 5.8.

## 2021-04-17 ENCOUNTER — Other Ambulatory Visit: Payer: Self-pay

## 2021-04-17 ENCOUNTER — Ambulatory Visit (INDEPENDENT_AMBULATORY_CARE_PROVIDER_SITE_OTHER): Payer: Medicare Other | Admitting: Family Medicine

## 2021-04-17 ENCOUNTER — Ambulatory Visit: Payer: Medicare Other | Admitting: Family Medicine

## 2021-04-17 VITALS — BP 136/90 | HR 84 | Temp 95.5°F | Resp 18 | Ht 66.0 in | Wt 271.0 lb

## 2021-04-17 DIAGNOSIS — E782 Mixed hyperlipidemia: Secondary | ICD-10-CM

## 2021-04-17 DIAGNOSIS — E038 Other specified hypothyroidism: Secondary | ICD-10-CM | POA: Diagnosis not present

## 2021-04-17 DIAGNOSIS — E1169 Type 2 diabetes mellitus with other specified complication: Secondary | ICD-10-CM | POA: Diagnosis not present

## 2021-04-17 DIAGNOSIS — Z6841 Body Mass Index (BMI) 40.0 and over, adult: Secondary | ICD-10-CM

## 2021-04-17 DIAGNOSIS — E876 Hypokalemia: Secondary | ICD-10-CM | POA: Diagnosis not present

## 2021-04-17 DIAGNOSIS — G720 Drug-induced myopathy: Secondary | ICD-10-CM

## 2021-04-17 DIAGNOSIS — E1159 Type 2 diabetes mellitus with other circulatory complications: Secondary | ICD-10-CM

## 2021-04-17 DIAGNOSIS — G4733 Obstructive sleep apnea (adult) (pediatric): Secondary | ICD-10-CM | POA: Diagnosis not present

## 2021-04-17 DIAGNOSIS — I152 Hypertension secondary to endocrine disorders: Secondary | ICD-10-CM

## 2021-04-17 DIAGNOSIS — L4050 Arthropathic psoriasis, unspecified: Secondary | ICD-10-CM

## 2021-04-17 NOTE — Patient Instructions (Addendum)
Start on synthroid 150 mcg 1/2 pill daily  Start on valsartan/hydrochlorothiazide 320/25 1/2 pill daily.  Start [place] for psoriatic arthritis.

## 2021-04-17 NOTE — Progress Notes (Addendum)
Subjective:  Patient ID: Whitney Eaton, female    DOB: September 13, 1950  Age: 71 y.o. MRN: [id number]  Chief Complaint  Patient presents with   Hyperlipidemia   Hypertension   Diabetes    HPI Diabetes:  Complications: Glucose checking: not checking. Most recent A1C: 5.8  Current medications: none.  Hyperlipidemia: Not taking any medicines. Intolerant to statins.  Hypertension: Current medications: stopped meds few months ago (previously was on atenolol 25 mg daily, hydralazine 25 mg twice daily, Valsartan/hydrochlorothiazide 320/25 mg once daily.)  OSA: needs new cpap. CPAP stopped working. Has not had a sleep study in over 5 yers.   Asthma: Uses breztri seasonally  Psoriatic arthritis: Severe flare. Worse than they have ever been. Previously saw Dr. Benjamine Mola and Butch Penny at [facility]. Meloxicam does not help. Off Taltz.  Diet: fairly healthy.  Exercise: no  In 10/2020: patient had major GI issue and so stopped medicines all except ambien.   Falls: [place] in June or July. Saw Dr. De Blanch, but was not getting better. Saw Dr. Henrene Pastor and given tramadol three times a day, but became severely constipated. Had major issues with coccyx pain.  Has had 3 more falls approximately since. Trips or lost balance each time but no major injuries other than her hips hurt.   Current Outpatient Medications on File Prior to Visit  Medication Sig Dispense Refill   ACCU-CHEK GUIDE test strip USE TO CHECK FASTING BLOOD SUGAR AS DIRECTED 50 strip 4   Accu-Chek Softclix Lancets lancets CHECK FASTING BLOOD SUGAR ONCE DAILY 100 each 3   blood glucose meter kit and supplies KIT Check sugars fasting. E11.69 1 each 0   No current facility-administered medications on file prior to visit.   Past Medical History:  Diagnosis Date   Asthma    Diabetes ([place])    Hyperlipemia    Hypertension    Psoriasis    Sleep apnea    Thyroid disease    Past Surgical History:  Procedure Laterality Date   ABDOMINAL  HYSTERECTOMY     CHOLECYSTECTOMY  2011   HYSTERECTOMY ABDOMINAL WITH SALPINGECTOMY     OOPHORECTOMY  1980    Family History  Problem Relation Age of Onset   Alzheimer's disease Mother    Breast cancer Mother    Arthritis Mother    Diabetes Mother    Eczema Father    CAD Father    Diabetes Father    Parkinson's disease Father    Breast cancer Sister    Neurologic Disorder Brother    Glaucoma Maternal Grandmother    Heart disease Other    Social History   Socioeconomic History   Marital status: Married    Spouse name: Not on file   Number of children: 2   Years of education: Not on file   Highest education level: Not on file  Occupational History   Occupation: homemaker  Tobacco Use   Smoking status: Never   Smokeless tobacco: Never  Vaping Use   Vaping Use: Never used  Substance and Sexual Activity   Alcohol use: Never   Drug use: Never   Sexual activity: Not on file  Other Topics Concern   Not on file  Social History Narrative   Not on file   Social Determinants of Health   Financial Resource Strain: Low Risk    Difficulty of Paying Living Expenses: Not hard at all  Food Insecurity: No Food Insecurity   Worried About [place] in the Last Year:  Never true   Ran Out of Food in the Last Year: Never true  Transportation Needs: No Transportation Needs   Lack of Transportation (Medical): No   Lack of Transportation (Non-Medical): No  Physical Activity: Insufficiently Active   Days of Exercise per Week: 4 days   Minutes of Exercise per Session: 20 min  Stress: No Stress Concern Present   Feeling of Stress : Only a little  Social Connections: Engineer, building services of Communication with Friends and Family: More than three times a week   Frequency of Social Gatherings with Friends and Family: More than three times a week   Attends Religious Services: More than 4 times per year   Active Member of [employer] or Organizations: Yes   Attends Programme researcher, broadcasting/film/video: More than 4 times per year   Marital Status: Married    Review of Systems  Constitutional:  Positive for fatigue. Negative for chills and fever.  HENT:  Negative for congestion, rhinorrhea and sore throat.   Respiratory:  Negative for cough and shortness of breath.   Cardiovascular:  Negative for chest pain and palpitations.  Gastrointestinal:  Negative for abdominal pain, constipation, diarrhea, nausea and vomiting.       Abdominal bloating and discomfort after eating.  Genitourinary:  Negative for dysuria and urgency.  Musculoskeletal:  Positive for arthralgias (bilateral knee) and back pain. Negative for myalgias.  Neurological:  Positive for weakness. Negative for dizziness, light-headedness and headaches.  Psychiatric/Behavioral:  Negative for dysphoric mood. The patient is not nervous/anxious.     Objective:  BP 136/90    Pulse 84    Temp (!) 95.5 F (35.3 C)    Resp 18    Ht _0  (1.676 m)    Wt 271 lb (122.9 kg)    BMI 43.74 kg/m   BP/Weight 04/17/2021 12/10/2020 8/50/2774  Systolic BP 128 786 767  Diastolic BP 90 78 68  Wt. (Lbs) 271 257 265  BMI 43.74 39.08 40.29    Physical Exam  Diabetic Foot Exam - Simple   No data filed      Lab Results  Component Value Date   WBC 9.2 04/14/2021   HGB 13.8 04/14/2021   HCT 40.6 04/14/2021   PLT 245 04/14/2021   GLUCOSE 118 (H) 04/14/2021   CHOL 211 (H) 04/14/2021   TRIG 164 (H) 04/14/2021   HDL 47 04/14/2021   LDLCALC 135 (H) 04/14/2021   ALT 14 04/14/2021   AST 16 04/14/2021   NA 139 04/14/2021   K 3.2 (L) 04/14/2021   CL 95 (L) 04/14/2021   CREATININE 0.71 04/14/2021   BUN 10 04/14/2021   CO2 29 04/14/2021   TSH 15.400 (H) 04/14/2021   HGBA1C 5.8 (H) 04/14/2021   MICROALBUR 10 04/24/2020      Assessment & Plan:   Problem List Items Addressed This Visit       Cardiovascular and Mediastinum   Hypertension associated with diabetes ([place])    Not quite at goal  Start on  valsartan/hydrochlorothiazide 320/25 1/2 pill daily.        Relevant Medications   valsartan-hydrochlorothiazide (DIOVAN-HCT) 320-25 MG tablet     Respiratory   Obstructive sleep apnea - Primary    Order home sleep study.      Relevant Orders   Home sleep test     Endocrine   Other specified hypothyroidism    Restart on synthroid 150 mcg 1/2 pill daily. Recheck in 6 weeks.  Relevant Medications   levothyroxine (SYNTHROID) 150 MCG tablet   Other Relevant Orders   TSH   Combined hyperlipidemia associated with type 2 diabetes mellitus ([place])    Control: very good Does not need to check sugars.  Recommend check feet daily. Recommend annual eye exams. Medicines: none Continue to work on eating a healthy diet and exercise.  Labs drawn today.         Relevant Medications   valsartan-hydrochlorothiazide (DIOVAN-HCT) 320-25 MG tablet     Musculoskeletal and Integument   Psoriatic arthritis ([place])    Poorly controlled.  Recommend use meloxicam 15 mg daily Started on otezla starter pack. Tried on numerous injectable medicines. Recommend patient call dermatology and rheumatology and see with whom she can get in sooner.      Relevant Medications   meloxicam (MOBIC) 15 MG tablet   Apremilast (OTEZLA) 10 & 20 & 30 MG TBPK   Drug-induced myopathy    Intolerant to statins.         Other   Mixed hyperlipidemia    Elevated.  Recommend continue to work on eating healthy diet and exercise. Intolerant to statins.       Relevant Medications   valsartan-hydrochlorothiazide (DIOVAN-HCT) 320-25 MG tablet   Class 3 severe obesity due to excess calories with serious comorbidity and body mass index (BMI) of 40.0 to 44.9 in adult [facility])    Recommend continue to work on eating healthy diet and exercise. Consider GLP 1: ozempic.      Hypokalemia    Mild. Recommend increase potassium containing foods.       Relevant Orders   Comprehensive metabolic panel  .  Meds  ordered this encounter  Medications   meloxicam (MOBIC) 15 MG tablet    Sig: Take 1 tablet (15 mg total) by mouth daily.    Dispense:  1 tablet    Refill:  0   levothyroxine (SYNTHROID) 150 MCG tablet    Sig: Take 0.5 tablets (75 mcg total) by mouth daily.    Dispense:  1 tablet    Refill:  0   valsartan-hydrochlorothiazide (DIOVAN-HCT) 320-25 MG tablet    Sig: Take 0.5 tablets by mouth daily.    Dispense:  1 tablet    Refill:  0   Apremilast (OTEZLA) 10 & 20 & 30 MG TBPK    Sig: Titration starting pack    Dispense:  60 each    Refill:  0    Orders Placed This Encounter  Procedures   Comprehensive metabolic panel   TSH   Home sleep test     Follow-up: Return in about 6 weeks (around 05/29/2021) for chronic follow up.  An After Visit Summary was printed and given to the patient.  Rochel Brome, MD [facility] [phone]

## 2021-04-20 ENCOUNTER — Encounter: Payer: Self-pay | Admitting: Family Medicine

## 2021-04-20 DIAGNOSIS — E876 Hypokalemia: Secondary | ICD-10-CM | POA: Insufficient documentation

## 2021-04-20 DIAGNOSIS — E1169 Type 2 diabetes mellitus with other specified complication: Secondary | ICD-10-CM | POA: Insufficient documentation

## 2021-04-20 DIAGNOSIS — E782 Mixed hyperlipidemia: Secondary | ICD-10-CM | POA: Insufficient documentation

## 2021-04-20 MED ORDER — OTEZLA 10 & 20 & 30 MG PO TBPK: ORAL_TABLET | ORAL | 0 refills | Status: DC

## 2021-04-20 MED ORDER — MELOXICAM 15 MG PO TABS: 15.0000 mg | ORAL_TABLET | Freq: Every day | ORAL | 0 refills | Status: DC

## 2021-04-20 MED ORDER — LEVOTHYROXINE SODIUM 150 MCG PO TABS: 75.0000 ug | ORAL_TABLET | Freq: Every day | ORAL | 0 refills | Status: DC

## 2021-04-20 MED ORDER — VALSARTAN-HYDROCHLOROTHIAZIDE 320-25 MG PO TABS: 0.5000 | ORAL_TABLET | Freq: Every day | ORAL | 0 refills | Status: DC

## 2021-04-20 NOTE — Assessment & Plan Note (Signed)
Mild. Recommend increase potassium containing foods.

## 2021-04-20 NOTE — Assessment & Plan Note (Signed)
Intolerant to statins. 

## 2021-04-20 NOTE — Assessment & Plan Note (Signed)
Not quite at goal  Start on valsartan/hydrochlorothiazide 320/25 1/2 pill daily.

## 2021-04-20 NOTE — Assessment & Plan Note (Signed)
Recommend continue to work on eating healthy diet and exercise. Consider GLP 1: ozempic.

## 2021-04-20 NOTE — Assessment & Plan Note (Signed)
Order home sleep study  

## 2021-04-20 NOTE — Assessment & Plan Note (Signed)
Poorly controlled.  Recommend use meloxicam 15 mg daily Started on otezla starter pack. Tried on numerous injectable medicines. Recommend patient call dermatology and rheumatology and see with whom she can get in sooner.

## 2021-04-20 NOTE — Assessment & Plan Note (Signed)
Control: very good Does not need to check sugars.  Recommend check feet daily. Recommend annual eye exams. Medicines: none Continue to work on eating a healthy diet and exercise.  Labs drawn today.

## 2021-04-20 NOTE — Assessment & Plan Note (Addendum)
Restart on synthroid 150 mcg 1/2 pill daily. Recheck in 6 weeks.

## 2021-04-20 NOTE — Assessment & Plan Note (Signed)
Elevated.  Recommend continue to work on eating healthy diet and exercise. Intolerant to statins.

## 2021-05-01 DIAGNOSIS — G4733 Obstructive sleep apnea (adult) (pediatric): Secondary | ICD-10-CM | POA: Diagnosis not present

## 2021-05-01 DIAGNOSIS — R0602 Shortness of breath: Secondary | ICD-10-CM | POA: Diagnosis not present

## 2021-05-02 DIAGNOSIS — G4733 Obstructive sleep apnea (adult) (pediatric): Secondary | ICD-10-CM | POA: Diagnosis not present

## 2021-05-02 DIAGNOSIS — R0602 Shortness of breath: Secondary | ICD-10-CM | POA: Diagnosis not present

## 2021-05-07 ENCOUNTER — Ambulatory Visit: Payer: Medicare Other | Admitting: Internal Medicine

## 2021-05-18 ENCOUNTER — Other Ambulatory Visit: Payer: Self-pay | Admitting: Family Medicine

## 2021-05-26 ENCOUNTER — Other Ambulatory Visit: Payer: Self-pay | Admitting: Family Medicine

## 2021-05-26 ENCOUNTER — Encounter: Payer: Self-pay | Admitting: Family Medicine

## 2021-05-26 DIAGNOSIS — E038 Other specified hypothyroidism: Secondary | ICD-10-CM

## 2021-05-29 ENCOUNTER — Other Ambulatory Visit: Payer: Medicare Other

## 2021-06-02 ENCOUNTER — Ambulatory Visit (INDEPENDENT_AMBULATORY_CARE_PROVIDER_SITE_OTHER): Payer: Medicare Other | Admitting: Family Medicine

## 2021-06-02 DIAGNOSIS — E038 Other specified hypothyroidism: Secondary | ICD-10-CM

## 2021-06-02 NOTE — Progress Notes (Signed)
Cancelled. Dr. Cox  

## 2021-06-17 NOTE — Telephone Encounter (Signed)
Pt no showed bone density appointment ?

## 2021-06-22 ENCOUNTER — Other Ambulatory Visit: Payer: Self-pay | Admitting: Family Medicine

## 2021-06-22 DIAGNOSIS — I10 Essential (primary) hypertension: Secondary | ICD-10-CM

## 2021-06-24 ENCOUNTER — Telehealth: Payer: Self-pay

## 2021-06-24 NOTE — Telephone Encounter (Signed)
I left message on voicemail to call us back to set up an appointment for AWV. ?

## 2021-07-02 DIAGNOSIS — L4050 Arthropathic psoriasis, unspecified: Secondary | ICD-10-CM | POA: Diagnosis not present

## 2021-07-02 DIAGNOSIS — L299 Pruritus, unspecified: Secondary | ICD-10-CM | POA: Diagnosis not present

## 2021-07-02 DIAGNOSIS — L400 Psoriasis vulgaris: Secondary | ICD-10-CM | POA: Diagnosis not present

## 2021-07-16 ENCOUNTER — Telehealth: Payer: Self-pay

## 2021-07-16 NOTE — Progress Notes (Unsigned)
    Chronic Care Management Pharmacy Assistant   Name: Whitney Eaton  MRN: [id number] DOB: [birth year]  Reason for Encounter: Disease State/ Cholesterol  Recent office visits:  04-17-2021 Rochel Brome, MD. CHANGE Valsartan/ HCTZ 320-25 mg daily TO half tablet daily. START otezla pack. Patient reported not taking atenolol, albuterol inhaler, breztri, vitamin B12, estradoil cream, flonase, hydralazine, taltz, xyzal, claritin, metformin, multivitamin, omega 3, zofran, rifaxmin, flomax, tramadol and ambien.  Recent consult visits:  None  Hospital visits:  None in previous 6 months  Medications: Outpatient Encounter Medications as of 07/16/2021  Medication Sig   ACCU-CHEK GUIDE test strip USE TO CHECK FASTING BLOOD SUGAR AS DIRECTED   Accu-Chek Softclix Lancets lancets CHECK FASTING BLOOD SUGAR ONCE DAILY   Apremilast (OTEZLA) 10 & 20 & 30 MG TBPK Titration starting pack   blood glucose meter kit and supplies KIT Check sugars fasting. E11.69   levothyroxine (SYNTHROID) 150 MCG tablet Take 0.5 tablets (75 mcg total) by mouth daily.   meloxicam (MOBIC) 15 MG tablet Take 1 tablet (15 mg total) by mouth daily.   valsartan-hydrochlorothiazide (DIOVAN-HCT) 320-25 MG tablet Take 0.5 tablets by mouth daily.   zolpidem (AMBIEN) 10 MG tablet TAKE 1 TABLET(10 MG) BY MOUTH AT BEDTIME   No facility-administered encounter medications on file as of 07/16/2021.  Lipid Panel    Component Value Date/Time   CHOL 211 (H) 04/14/2021 0923   TRIG 164 (H) 04/14/2021 0923   HDL 47 04/14/2021 0923   LDLCALC 135 (H) 04/14/2021 0923    Care Gaps: Last annual wellness visit? 06-04-2020 Hep C screening overdue Tdap overdue Yearly ophthalmology overdue Covid booster overdue Mammogram overdue Colonoscopy overdue  07-16-2021: 1st attempt left VM 07-21-2021: 2nd attempt left VM 07-22-2021: 3rd attempt left VM  Star Rating Drugs: Valsartan/HCTZ 30-25 mg- Last filled 04-20-2021  [place]  Pharmacist Assistant [phone]

## 2021-07-17 DIAGNOSIS — G4733 Obstructive sleep apnea (adult) (pediatric): Secondary | ICD-10-CM | POA: Diagnosis not present

## 2021-07-23 ENCOUNTER — Encounter: Payer: Self-pay | Admitting: Gastroenterology

## 2021-09-04 DIAGNOSIS — R531 Weakness: Secondary | ICD-10-CM | POA: Diagnosis not present

## 2021-09-04 DIAGNOSIS — L4050 Arthropathic psoriasis, unspecified: Secondary | ICD-10-CM | POA: Diagnosis not present

## 2021-09-04 DIAGNOSIS — L299 Pruritus, unspecified: Secondary | ICD-10-CM | POA: Diagnosis not present

## 2021-09-04 DIAGNOSIS — L400 Psoriasis vulgaris: Secondary | ICD-10-CM | POA: Diagnosis not present

## 2021-09-08 ENCOUNTER — Other Ambulatory Visit: Payer: Self-pay | Admitting: Family Medicine

## 2021-09-08 ENCOUNTER — Other Ambulatory Visit: Payer: Self-pay

## 2021-09-08 MED ORDER — POTASSIUM CHLORIDE 20 MEQ PO PACK: 20.0000 meq | PACK | Freq: Every day | ORAL | 1 refills | Status: DC

## 2021-09-10 ENCOUNTER — Other Ambulatory Visit: Payer: Self-pay | Admitting: Family Medicine

## 2021-09-10 DIAGNOSIS — E038 Other specified hypothyroidism: Secondary | ICD-10-CM

## 2021-09-23 ENCOUNTER — Other Ambulatory Visit: Payer: Medicare Other

## 2021-10-08 ENCOUNTER — Encounter (INDEPENDENT_AMBULATORY_CARE_PROVIDER_SITE_OTHER): Payer: Self-pay

## 2021-10-09 ENCOUNTER — Ambulatory Visit: Payer: Medicare Other | Admitting: Family Medicine

## 2021-10-10 ENCOUNTER — Telehealth: Payer: Self-pay

## 2021-10-10 NOTE — Progress Notes (Unsigned)
Care Gap(s) Not Met that Need to be Addressed:   Colorectal Cancer Screening  Controlling High Blood Pressure   Action Taken: Messaged PCP about discussing being overdue for colonoscopy called pt to get an updated BP reading: 1st attempt lvm , 2nd attempt lvm, 3rd attempt no answer    Follow Up: None

## 2021-10-14 ENCOUNTER — Other Ambulatory Visit: Payer: Self-pay | Admitting: Family Medicine

## 2021-10-15 ENCOUNTER — Telehealth: Payer: Self-pay

## 2021-10-15 NOTE — Chronic Care Management (AMB) (Signed)
    Chronic Care Management Pharmacy Assistant   Name: Whitney Eaton  MRN: [id number] DOB: 25-Aug-1950  Reason for Encounter: Disease State/ General  Recent office visits:  None  Recent consult visits:  07-02-2021 Jolene Schimke, MD (Dermatology). Unable to view encounter.  Hospital visits:  None in previous 6 months  Medications: Outpatient Encounter Medications as of 10/15/2021  Medication Sig   ACCU-CHEK GUIDE test strip USE TO CHECK FASTING BLOOD SUGAR AS DIRECTED   Accu-Chek Softclix Lancets lancets CHECK FASTING BLOOD SUGAR ONCE DAILY   Apremilast (OTEZLA) 10 & 20 & 30 MG TBPK Titration starting pack   blood glucose meter kit and supplies KIT Check sugars fasting. E11.69   KLOR-CON 20 MEQ packet TAKE 1 PACKET BY MOUTH DAILY IN THE AFTERNOON   levothyroxine (SYNTHROID) 150 MCG tablet Take 0.5 tablets (75 mcg total) by mouth daily.   meloxicam (MOBIC) 15 MG tablet Take 1 tablet (15 mg total) by mouth daily.   valsartan-hydrochlorothiazide (DIOVAN-HCT) 320-25 MG tablet Take 0.5 tablets by mouth daily.   zolpidem (AMBIEN) 10 MG tablet TAKE 1 TABLET(10 MG) BY MOUTH AT BEDTIME   No facility-administered encounter medications on file as of 10/15/2021.     10-15-2021: 1st attempt left VM 10-21-2021: 2nd attempt left VM 10-23-2021: 3rd attempt left VM  Care Gaps: Last annual wellness visit? 06-04-2020 Flu vaccine overdue A1C overdue Tdap overdue Yearly ophthalmology overdue Covid booster overdue Mammogram overdue Colonoscopy overdue  Star Rating Drugs: Valsartan/HCTZ 30-25 mg- Last filled 01-24-2021 (Called [facility] and was told last filled 01-30-2021 90 DS)  [place] Clinical Pharmacist Assistant [phone]

## 2021-11-24 ENCOUNTER — Other Ambulatory Visit: Payer: Self-pay | Admitting: Family Medicine

## 2021-11-24 DIAGNOSIS — E038 Other specified hypothyroidism: Secondary | ICD-10-CM

## 2021-11-24 DIAGNOSIS — I152 Hypertension secondary to endocrine disorders: Secondary | ICD-10-CM

## 2021-11-25 ENCOUNTER — Other Ambulatory Visit: Payer: Self-pay

## 2021-11-25 ENCOUNTER — Other Ambulatory Visit: Payer: Self-pay | Admitting: Family Medicine

## 2021-11-25 DIAGNOSIS — I152 Hypertension secondary to endocrine disorders: Secondary | ICD-10-CM

## 2021-11-25 DIAGNOSIS — E038 Other specified hypothyroidism: Secondary | ICD-10-CM

## 2021-11-25 MED ORDER — LEVOTHYROXINE SODIUM 150 MCG PO TABS: 75.0000 ug | ORAL_TABLET | Freq: Every day | ORAL | 0 refills | Status: DC

## 2021-11-25 MED ORDER — VALSARTAN-HYDROCHLOROTHIAZIDE 320-25 MG PO TABS: 0.5000 | ORAL_TABLET | Freq: Every day | ORAL | 0 refills | Status: DC

## 2021-11-25 NOTE — Telephone Encounter (Signed)
Follow-up appointment scheduled.  Refills for 1 month sent to pharmacy.

## 2021-12-02 ENCOUNTER — Ambulatory Visit: Payer: Medicare Other | Admitting: Family Medicine

## 2021-12-10 ENCOUNTER — Other Ambulatory Visit: Payer: Self-pay | Admitting: Family Medicine

## 2021-12-10 DIAGNOSIS — E038 Other specified hypothyroidism: Secondary | ICD-10-CM

## 2022-01-26 ENCOUNTER — Other Ambulatory Visit: Payer: Self-pay

## 2022-01-26 DIAGNOSIS — E1159 Type 2 diabetes mellitus with other circulatory complications: Secondary | ICD-10-CM

## 2022-01-26 MED ORDER — VALSARTAN-HYDROCHLOROTHIAZIDE 320-25 MG PO TABS: 0.5000 | ORAL_TABLET | Freq: Every day | ORAL | 0 refills | Status: DC

## 2022-01-29 ENCOUNTER — Telehealth: Payer: Self-pay

## 2022-01-29 NOTE — Telephone Encounter (Signed)
error 

## 2022-02-10 DIAGNOSIS — G4733 Obstructive sleep apnea (adult) (pediatric): Secondary | ICD-10-CM | POA: Diagnosis not present

## 2022-02-17 ENCOUNTER — Telehealth: Payer: Self-pay

## 2022-02-17 ENCOUNTER — Ambulatory Visit (INDEPENDENT_AMBULATORY_CARE_PROVIDER_SITE_OTHER): Payer: Medicare Other | Admitting: Family Medicine

## 2022-02-17 ENCOUNTER — Other Ambulatory Visit: Payer: Self-pay

## 2022-02-17 ENCOUNTER — Other Ambulatory Visit: Payer: Self-pay | Admitting: Family Medicine

## 2022-02-17 VITALS — BP 132/76 | HR 84 | Temp 96.5°F | Resp 18 | Ht 66.0 in | Wt 302.0 lb

## 2022-02-17 DIAGNOSIS — I152 Hypertension secondary to endocrine disorders: Secondary | ICD-10-CM | POA: Diagnosis not present

## 2022-02-17 DIAGNOSIS — G720 Drug-induced myopathy: Secondary | ICD-10-CM | POA: Diagnosis not present

## 2022-02-17 DIAGNOSIS — E782 Mixed hyperlipidemia: Secondary | ICD-10-CM

## 2022-02-17 DIAGNOSIS — M25551 Pain in right hip: Secondary | ICD-10-CM | POA: Diagnosis not present

## 2022-02-17 DIAGNOSIS — G4733 Obstructive sleep apnea (adult) (pediatric): Secondary | ICD-10-CM | POA: Diagnosis not present

## 2022-02-17 DIAGNOSIS — Z23 Encounter for immunization: Secondary | ICD-10-CM

## 2022-02-17 DIAGNOSIS — E039 Hypothyroidism, unspecified: Secondary | ICD-10-CM | POA: Diagnosis not present

## 2022-02-17 DIAGNOSIS — T466X5A Adverse effect of antihyperlipidemic and antiarteriosclerotic drugs, initial encounter: Secondary | ICD-10-CM

## 2022-02-17 DIAGNOSIS — E1169 Type 2 diabetes mellitus with other specified complication: Secondary | ICD-10-CM

## 2022-02-17 DIAGNOSIS — L4050 Arthropathic psoriasis, unspecified: Secondary | ICD-10-CM

## 2022-02-17 DIAGNOSIS — E038 Other specified hypothyroidism: Secondary | ICD-10-CM

## 2022-02-17 DIAGNOSIS — E559 Vitamin D deficiency, unspecified: Secondary | ICD-10-CM

## 2022-02-17 DIAGNOSIS — E876 Hypokalemia: Secondary | ICD-10-CM

## 2022-02-17 DIAGNOSIS — E1159 Type 2 diabetes mellitus with other circulatory complications: Secondary | ICD-10-CM

## 2022-02-17 MED ORDER — LEVOTHYROXINE SODIUM 150 MCG PO TABS: 75.0000 ug | ORAL_TABLET | Freq: Every day | ORAL | 0 refills | Status: DC

## 2022-02-17 MED ORDER — CELECOXIB 200 MG PO CAPS: 200.0000 mg | ORAL_CAPSULE | Freq: Every day | ORAL | 1 refills | Status: DC

## 2022-02-17 MED ORDER — ZOLPIDEM TARTRATE 10 MG PO TABS: ORAL_TABLET | ORAL | 5 refills | Status: DC

## 2022-02-17 MED ORDER — VALSARTAN-HYDROCHLOROTHIAZIDE 320-25 MG PO TABS: 1.0000 | ORAL_TABLET | Freq: Every day | ORAL | 1 refills | Status: DC

## 2022-02-17 NOTE — Progress Notes (Signed)
Subjective:  Patient ID: Whitney Eaton, female    DOB: [birth year]  Age: 71 y.o. MRN: [id number]  Chief Complaint  Patient presents with   Hypothyroidism   Diabetes   Hyperlipidemia    HPI Diabetes:  Complications: hypertension  Glucose checking: not checking. Most recent A1C: 5.8  Current medications: none.  Hyperlipidemia: Not taking any medicines. Intolerant to statins.  Hypertension: Current medications: Valsartan/hydrochlorothiazide 320/25 mg once daily.  OSA: needs new cpap. Patient had sleep study in February. She needs new cpap equipment.  Asthma: Not using breztri.  Psoriatic arthritis:  Mobility has worsened. Has pain in right hip. Not grocery shopping. Taking advil which helps some. Heating pad helps. Had fall earlier this year. No major injuries.   She has been staying at home. She likes this, but she feels insecure.  She texts a lot. She sees her family when they come to visit.   Current Outpatient Medications on File Prior to Visit  Medication Sig Dispense Refill   ACCU-CHEK GUIDE test strip USE TO CHECK FASTING BLOOD SUGAR AS DIRECTED 50 strip 4   Accu-Chek Softclix Lancets lancets CHECK FASTING BLOOD SUGAR ONCE DAILY 100 each 3   blood glucose meter kit and supplies KIT Check sugars fasting. E11.69 1 each 0   metFORMIN (GLUCOPHAGE) 1000 MG tablet Take 1,000 mg by mouth 2 (two) times daily with a meal.     KLOR-CON 20 MEQ packet TAKE 1 PACKET BY MOUTH DAILY IN THE AFTERNOON 90 each 0   No current facility-administered medications on file prior to visit.   Past Medical History:  Diagnosis Date   Asthma    Diabetes ([place])    Hyperlipemia    Hypertension    Psoriasis    Sleep apnea    Thyroid disease    Past Surgical History:  Procedure Laterality Date   ABDOMINAL HYSTERECTOMY     CHOLECYSTECTOMY  2011   HYSTERECTOMY ABDOMINAL WITH SALPINGECTOMY     OOPHORECTOMY  1980    Family History  Problem Relation Age of Onset   Alzheimer's disease Mother     Breast cancer Mother    Arthritis Mother    Diabetes Mother    Eczema Father    CAD Father    Diabetes Father    Parkinson's disease Father    Breast cancer Sister    Neurologic Disorder Brother    Glaucoma Maternal Grandmother    Heart disease Other    Social History   Socioeconomic History   Marital status: Married    Spouse name: Not on file   Number of children: 2   Years of education: Not on file   Highest education level: Not on file  Occupational History   Occupation: homemaker  Tobacco Use   Smoking status: Never   Smokeless tobacco: Never  Vaping Use   Vaping Use: Never used  Substance and Sexual Activity   Alcohol use: Never   Drug use: Never   Sexual activity: Not on file  Other Topics Concern   Not on file  Social History Narrative   Not on file   Social Determinants of Health   Financial Resource Strain: Low Risk  (06/04/2020)   Overall Financial Resource Strain (CARDIA)    Difficulty of Paying Living Expenses: Not hard at all  Food Insecurity: No Food Insecurity (06/04/2020)   Hunger Vital Sign    Worried About Running Out of Food in the Last Year: Never true    Ran Out of Food in  the Last Year: Never true  Transportation Needs: No Transportation Needs (06/04/2020)   PRAPARE - Hydrologist (Medical): No    Lack of Transportation (Non-Medical): No  Physical Activity: Insufficiently Active (06/04/2020)   Exercise Vital Sign    Days of Exercise per Week: 4 days    Minutes of Exercise per Session: 20 min  Stress: No Stress Concern Present (06/04/2020)   [place]    Feeling of Stress : Only a little  Social Connections: Socially Integrated (06/04/2020)   Social Connection and Isolation Panel [NHANES]    Frequency of Communication with Friends and Family: More than three times a week    Frequency of Social Gatherings with Friends and Family: More than three times a  week    Attends Religious Services: More than 4 times per year    Active Member of [employer] or Organizations: Yes    Attends Music therapist: More than 4 times per year    Marital Status: Married    Review of Systems  Constitutional:  Negative for chills, fatigue and fever.  HENT:  Negative for congestion, rhinorrhea and sore throat.   Respiratory:  Negative for cough and shortness of breath.   Cardiovascular:  Negative for chest pain.  Gastrointestinal:  Negative for abdominal pain, constipation, diarrhea, nausea and vomiting.  Genitourinary:  Negative for dysuria and urgency.  Musculoskeletal:  Positive for arthralgias (right hip pain) and back pain. Negative for myalgias.  Neurological:  Negative for dizziness, weakness, light-headedness and headaches.  Psychiatric/Behavioral:  Positive for dysphoric mood. The patient is not nervous/anxious.      Objective:  BP 132/76   Pulse 84   Temp (!) 96.5 F (35.8 C)   Resp 18   Ht _0  (1.676 m)   Wt (!) 302 lb (137 kg)   BMI 48.74 kg/m      02/17/2022   11:27 AM 04/17/2021    3:10 PM 12/10/2020    3:15 PM  BP/Weight  Systolic BP 401 027 253  Diastolic BP 76 90 78  Wt. (Lbs) 302 271 257  BMI 48.74 kg/m2 43.74 kg/m2 39.08 kg/m2    Physical Exam Vitals reviewed.  Constitutional:      Appearance: Normal appearance. She is obese.  Neck:     Vascular: No carotid bruit.  Cardiovascular:     Rate and Rhythm: Normal rate and regular rhythm.     Heart sounds: Normal heart sounds.  Pulmonary:     Effort: Pulmonary effort is normal. No respiratory distress.     Breath sounds: Normal breath sounds.  Abdominal:     General: Abdomen is flat. Bowel sounds are normal.     Palpations: Abdomen is soft.     Tenderness: There is no abdominal tenderness.  Neurological:     Mental Status: She is alert and oriented to person, place, and time.  Psychiatric:        Mood and Affect: Mood normal.        Behavior: Behavior  normal.     Diabetic Foot Exam - Simple   Simple Foot Form Diabetic Foot exam was performed with the following findings: Yes 02/17/2022  9:43 PM  Visual Inspection No deformities, no ulcerations, no other skin breakdown bilaterally: Yes Sensation Testing Intact to touch and monofilament testing bilaterally: Yes Pulse Check Posterior Tibialis and Dorsalis pulse intact bilaterally: Yes Comments      Lab Results  Component  Value Date   WBC 9.0 02/17/2022   HGB 13.2 02/17/2022   HCT 38.5 02/17/2022   PLT 246 02/17/2022   GLUCOSE 175 (H) 02/17/2022   CHOL 223 (H) 02/17/2022   TRIG 259 (H) 02/17/2022   HDL 43 02/17/2022   LDLCALC 134 (H) 02/17/2022   ALT 16 02/17/2022   AST 17 02/17/2022   NA 135 02/17/2022   K 3.1 (L) 02/17/2022   CL 91 (L) 02/17/2022   CREATININE 1.06 (H) 02/17/2022   BUN 14 02/17/2022   CO2 28 02/17/2022   TSH 66.500 (H) 02/17/2022   HGBA1C 7.3 (H) 02/17/2022   MICROALBUR 10 04/24/2020      Assessment & Plan:   Problem List Items Addressed This Visit       Cardiovascular and Mediastinum   Hypertension associated with diabetes ([place])    Well controlled.  No changes to medicines. Continue Valsartan/hydrochlorothiazide 320/25 mg once daily. Continue to work on eating a healthy diet and exercise.  Labs drawn today.        Relevant Medications   metFORMIN (GLUCOPHAGE) 1000 MG tablet   valsartan-hydrochlorothiazide (DIOVAN-HCT) 320-25 MG tablet   Other Relevant Orders   CBC with Differential/Platelet (Completed)   Comprehensive metabolic panel (Completed)   Hemoglobin A1c (Completed)   Cardiovascular Risk Assessment (Completed)     Respiratory   Obstructive sleep apnea    Order sleep apnea supplies.         Endocrine   Other specified hypothyroidism    Previously well controlled, but much worse.  Thyroid function abnormal. TSH very off. Please confirm if she has been taking her medicine and what dose. This will certainly cause her to  feel tired and low energy.  Recheck TSH in 6 weeks.       Relevant Medications   zolpidem (AMBIEN) 10 MG tablet   Combined hyperlipidemia associated with type 2 diabetes mellitus (HCC)    Control: Poorly Recommend check sugars fasting daily. Recommend check feet daily. Recommend annual eye exams. Medicines: HBA1C: 5.8 increased to 7.3. Poorly controlled. Not spilling protein in urine. Recommend change metformin to xigduo xr 06/998 mg 2 daily in am. Labs drawn today and recommendations as above.         Relevant Medications   metFORMIN (GLUCOPHAGE) 1000 MG tablet   valsartan-hydrochlorothiazide (DIOVAN-HCT) 320-25 MG tablet   Other Relevant Orders   Lipid panel (Completed)   Microalbumin / creatinine urine ratio (Completed)     Musculoskeletal and Integument   Psoriatic arthritis ([place])    Recommend follow up with dermatology.       Relevant Medications   celecoxib (CELEBREX) 200 MG capsule   Statin myopathy    Intolerant to statins.        Other   Vitamin D insufficiency    Vitamin D is low. Recommend vitamin D 50K weekly.      Mixed hyperlipidemia    Not at goal.  Recommend continue to work on eating healthy diet and exercise. Cholesterol: LDL 134, Trigs 259. Recommend repatha 140 mg shot every 2 weeks (aortic atherosclerosis on xray), Recommend vascepa 1 gm 2 capsules twice daily. Checked labs and recommendations are as above.       Relevant Medications   valsartan-hydrochlorothiazide (DIOVAN-HCT) 320-25 MG tablet   Hypokalemia    Potassium 3.1 too low. Change POTASSIUM CHLORIDE packets to POTASSIUM CHLORIDE microtab 10 meq 2 daily.      Other Visit Diagnoses     Need for influenza vaccination    -  Primary   Relevant Orders   Flu Vaccine QUAD High Dose(Fluad) (Completed)   Acquired hypothyroidism       Relevant Orders   TSH (Completed)   Vitamin D deficiency       Relevant Orders   VITAMIN D 25 Hydroxy (Vit-D Deficiency, Fractures) (Completed)    Right hip pain       Relevant Medications   celecoxib (CELEBREX) 200 MG capsule   Other Relevant Orders   DG Hip Unilat W OR W/O Pelvis Min 4 Views Right     .  Meds ordered this encounter  Medications   celecoxib (CELEBREX) 200 MG capsule    Sig: Take 1 capsule (200 mg total) by mouth daily.    Dispense:  90 capsule    Refill:  1   zolpidem (AMBIEN) 10 MG tablet    Sig: TAKE 1 TABLET(10 MG) BY MOUTH AT BEDTIME    Dispense:  30 tablet    Refill:  5   valsartan-hydrochlorothiazide (DIOVAN-HCT) 320-25 MG tablet    Sig: Take 1 tablet by mouth daily.    Dispense:  90 tablet    Refill:  1    Orders Placed This Encounter  Procedures   DG Hip Unilat W OR W/O Pelvis Min 4 Views Right   Flu Vaccine QUAD High Dose(Fluad)   CBC with Differential/Platelet   Comprehensive metabolic panel   Hemoglobin A1c   Lipid panel   TSH   VITAMIN D 25 Hydroxy (Vit-D Deficiency, Fractures)   Microalbumin / creatinine urine ratio   Cardiovascular Risk Assessment     Follow-up: Return in about 3 months (around 05/19/2022) for chronic fasting.  An After Visit Summary was printed and given to the patient.  Rochel Brome, MD [facility] [phone]

## 2022-02-17 NOTE — Patient Instructions (Addendum)
Start Celebrex 200 mg once daily.  Ordering home physical therapy in 2024.

## 2022-02-17 NOTE — Chronic Care Management (AMB) (Signed)
    Chronic Care Management Pharmacy Assistant   Name: Whitney Eaton  MRN: [id number] DOB: 02-14-1951  Reason for Encounter: Disease State/ General  Recent office visits:  None  Recent consult visits:  09-04-2021 Schuyler Amor (Dermatology). Unable to view encounter.  Hospital visits:  None in previous 6 months  Medications: Outpatient Encounter Medications as of 02/17/2022  Medication Sig   ACCU-CHEK GUIDE test strip USE TO CHECK FASTING BLOOD SUGAR AS DIRECTED   Accu-Chek Softclix Lancets lancets CHECK FASTING BLOOD SUGAR ONCE DAILY   Apremilast (OTEZLA) 10 & 20 & 30 MG TBPK Titration starting pack   blood glucose meter kit and supplies KIT Check sugars fasting. E11.69   KLOR-CON 20 MEQ packet TAKE 1 PACKET BY MOUTH DAILY IN THE AFTERNOON   levothyroxine (SYNTHROID) 150 MCG tablet Take 0.5 tablets (75 mcg total) by mouth daily.   meloxicam (MOBIC) 15 MG tablet Take 1 tablet (15 mg total) by mouth daily.   valsartan-hydrochlorothiazide (DIOVAN-HCT) 320-25 MG tablet Take 0.5 tablets by mouth daily.   zolpidem (AMBIEN) 10 MG tablet TAKE 1 TABLET(10 MG) BY MOUTH AT BEDTIME   No facility-administered encounter medications on file as of 02/17/2022.   02-17-2022: 1st attempt left VM 02-18-2022: 2nd attempt left VM 02-19-2022: 3rd attempt left VM  Care Gaps: Tdap overdue Yearly ophthalmology overdue Covid booster overdue Mammogram overdue Colonoscopy overdue Uacr overdue AWV overdue Flu vaccine overdue  A1C overdue  Foot exam overdue  Star Rating Drugs: Valsartan/HCTZ 320-25 mg- Last filled 01-26-2022 60 DS. Previous 11-25-2021 60 DS.  [place] Pharmacist Assistant [phone]

## 2022-02-18 ENCOUNTER — Ambulatory Visit (INDEPENDENT_AMBULATORY_CARE_PROVIDER_SITE_OTHER): Payer: Medicare Other

## 2022-02-18 DIAGNOSIS — Z0000 Encounter for general adult medical examination without abnormal findings: Secondary | ICD-10-CM | POA: Diagnosis not present

## 2022-02-18 LAB — COMPREHENSIVE METABOLIC PANEL
ALT: 16 IU/L (ref 0–32)
AST: 17 IU/L (ref 0–40)
Albumin/Globulin Ratio: 1.3 (ref 1.2–2.2)
Albumin: 4.2 g/dL (ref 3.9–4.9)
Alkaline Phosphatase: 95 IU/L (ref 44–121)
BUN/Creatinine Ratio: 13 (ref 12–28)
BUN: 14 mg/dL (ref 8–27)
Bilirubin Total: 0.7 mg/dL (ref 0.0–1.2)
CO2: 28 mmol/L (ref 20–29)
Calcium: 9.5 mg/dL (ref 8.7–10.3)
Chloride: 91 mmol/L — ABNORMAL LOW (ref 96–106)
Creatinine, Ser: 1.06 mg/dL — ABNORMAL HIGH (ref 0.57–1.00)
Globulin, Total: 3.2 g/dL (ref 1.5–4.5)
Glucose: 175 mg/dL — ABNORMAL HIGH (ref 70–99)
Potassium: 3.1 mmol/L — ABNORMAL LOW (ref 3.5–5.2)
Sodium: 135 mmol/L (ref 134–144)
Total Protein: 7.4 g/dL (ref 6.0–8.5)
eGFR: 57 mL/min/{1.73_m2} — ABNORMAL LOW (ref >59)

## 2022-02-18 LAB — LIPID PANEL
Chol/HDL Ratio: 5.2 ratio — ABNORMAL HIGH (ref 0.0–4.4)
Cholesterol, Total: 223 mg/dL — ABNORMAL HIGH (ref 100–199)
HDL: 43 mg/dL (ref >39)
LDL Chol Calc (NIH): 134 mg/dL — ABNORMAL HIGH (ref 0–99)
Triglycerides: 259 mg/dL — ABNORMAL HIGH (ref 0–149)
VLDL Cholesterol Cal: 46 mg/dL — ABNORMAL HIGH (ref 5–40)

## 2022-02-18 LAB — CBC WITH DIFFERENTIAL/PLATELET
Basophils Absolute: 0.1 10*3/uL (ref 0.0–0.2)
Basos: 1 %
EOS (ABSOLUTE): 0.1 10*3/uL (ref 0.0–0.4)
Eos: 1 %
Hematocrit: 38.5 % (ref 34.0–46.6)
Hemoglobin: 13.2 g/dL (ref 11.1–15.9)
Immature Grans (Abs): 0 10*3/uL (ref 0.0–0.1)
Immature Granulocytes: 0 %
Lymphocytes Absolute: 1.8 10*3/uL (ref 0.7–3.1)
Lymphs: 20 %
MCH: 31.7 pg (ref 26.6–33.0)
MCHC: 34.3 g/dL (ref 31.5–35.7)
MCV: 92 fL (ref 79–97)
Monocytes Absolute: 0.5 10*3/uL (ref 0.1–0.9)
Monocytes: 6 %
Neutrophils Absolute: 6.4 10*3/uL (ref 1.4–7.0)
Neutrophils: 72 %
Platelets: 246 10*3/uL (ref 150–450)
RBC: 4.17 x10E6/uL (ref 3.77–5.28)
RDW: 13.6 % (ref 11.7–15.4)
WBC: 9 10*3/uL (ref 3.4–10.8)

## 2022-02-18 LAB — CARDIOVASCULAR RISK ASSESSMENT

## 2022-02-18 LAB — VITAMIN D 25 HYDROXY (VIT D DEFICIENCY, FRACTURES): Vit D, 25-Hydroxy: 20.4 ng/mL — ABNORMAL LOW (ref 30.0–100.0)

## 2022-02-18 LAB — TSH: TSH: 66.5 u[IU]/mL — ABNORMAL HIGH (ref 0.450–4.500)

## 2022-02-18 LAB — HEMOGLOBIN A1C
Est. average glucose Bld gHb Est-mCnc: 163 mg/dL
Hgb A1c MFr Bld: 7.3 % — ABNORMAL HIGH (ref 4.8–5.6)

## 2022-02-18 LAB — MICROALBUMIN / CREATININE URINE RATIO
Creatinine, Urine: 286.1 mg/dL
Microalb/Creat Ratio: 10 mg/g creat (ref 0–29)
Microalbumin, Urine: 28.5 ug/mL

## 2022-02-18 NOTE — Progress Notes (Signed)
Blood count normal.  Liver function normal.  Kidney function abnormal. Mild. Recommend hydration. Return in 2 weeks for CMP.  Potassium 3.1 too low. Change POTASSIUM CHLORIDE packets to POTASSIUM CHLORIDE microtab 10 meq 2 daily.  Thyroid function abnormal. TSH very off. Please confirm if she has been taking her medicine and what dose. This will certainly cause her to feel tired and low energy.  Cholesterol: LDL 134, Trigs 259. Recommend repatha 140 mg shot every 2 weeks (aortic atherosclerosis on xray), Recommend vascepa 1 gm 2 capsules twice daily.  HBA1C: 5.8 increased to 7.3. Poorly controlled. Not spilling protein in urine. Recommend change metformin to xigduo xr 06/998 mg 2 daily in am.  Vitamin D is low. Recommend vitamin D 50K weekly.

## 2022-02-19 NOTE — Progress Notes (Signed)
Subjective:   Whitney Eaton is a 71 y.o. female who presents for Medicare Annual (Subsequent) preventive examination.   I connected with  Whitney Eaton on 02/19/22 by a audio enabled telemedicine application and verified that I am speaking with the correct person using two identifiers.  Patient Location: Home  Provider Location: Office/Clinic  I discussed the limitations of evaluation and management by telemedicine. The patient expressed understanding and agreed to proceed.  Cardiac Risk Factors include: diabetes mellitus;advanced age (>73mn, >>68women);hypertension;obesity (BMI >30kg/m2)     Objective:    There were no vitals filed for this visit. There is no height or weight on file to calculate BMI.     06/04/2020   10:36 AM  Advanced Directives  Does Patient Have a Medical Advance Directive? Yes  Type of AParamedicof ABisonLiving will  Does patient want to make changes to medical advance directive? No - Patient declined  Copy of HHollidayin Chart? No - copy requested    Current Medications (verified) Outpatient Encounter Medications as of 02/18/2022  Medication Sig   ACCU-CHEK GUIDE test strip USE TO CHECK FASTING BLOOD SUGAR AS DIRECTED   Accu-Chek Softclix Lancets lancets CHECK FASTING BLOOD SUGAR ONCE DAILY   blood glucose meter kit and supplies KIT Check sugars fasting. E11.69   celecoxib (CELEBREX) 200 MG capsule Take 1 capsule (200 mg total) by mouth daily.   KLOR-CON 20 MEQ packet TAKE 1 PACKET BY MOUTH DAILY IN THE AFTERNOON   levothyroxine (SYNTHROID) 150 MCG tablet Take 0.5 tablets (75 mcg total) by mouth daily.   metFORMIN (GLUCOPHAGE) 1000 MG tablet Take 1,000 mg by mouth 2 (two) times daily with a meal.   valsartan-hydrochlorothiazide (DIOVAN-HCT) 320-25 MG tablet Take 1 tablet by mouth daily.   zolpidem (AMBIEN) 10 MG tablet TAKE 1 TABLET(10 MG) BY MOUTH AT BEDTIME   No facility-administered encounter  medications on file as of 02/18/2022.    Allergies (verified) Crestor [rosuvastatin], Fenofibrate, Jardiance [empagliflozin], and Lipitor [atorvastatin]   History: Past Medical History:  Diagnosis Date   [place]hma    [place]    Hyperlipemia    Hypertension    Psoriasis    Sleep apnea    Thyroid disease    Past Surgical History:  Procedure Laterality Date   ABDOMINAL HYSTERECTOMY     CHOLECYSTECTOMY  2011   HYSTERECTOMY ABDOMINAL WITH SALPINGECTOMY     OOPHORECTOMY  1980   Family History  Problem Relation Age of Onset   Alzheimer's disease Mother    Breast cancer Mother    Arthritis Mother    Diabetes Mother    Eczema Father    CAD Father    Diabetes Father    Parkinson's disease Father    Breast cancer Sister    Neurologic Disorder Brother    Glaucoma Maternal Grandmother    Heart disease Other    Social History   Socioeconomic History   Marital status: Married    Spouse name: Not on file   Number of children: 2   Years of education: Not on file   Highest education level: Not on file  Occupational History   Occupation: homemaker  Tobacco Use   Smoking status: Never   Smokeless tobacco: Never  Vaping Use   Vaping Use: Never used  Substance and Sexual Activity   Alcohol use: Never   Drug use: Never   Sexual activity: Not on file  Other Topics Concern   Not on file  Social History Narrative   Not on file   Social Determinants of Health   Financial Resource Strain: Low Risk  (06/04/2020)   Overall Financial Resource Strain (CARDIA)    Difficulty of Paying Living Expenses: Not hard at all  Food Insecurity: No Food Insecurity (06/04/2020)   Hunger Vital Sign    Worried About Running Out of Food in the Last Year: Never true    Ran Out of Food in the Last Year: Never true  Transportation Needs: No Transportation Needs (06/04/2020)   PRAPARE - Hydrologist (Medical): No    Lack of Transportation (Non-Medical): No  Physical  Activity: Insufficiently Active (06/04/2020)   Exercise Vital Sign    Days of Exercise per Week: 4 days    Minutes of Exercise per Session: 20 min  Stress: No Stress Concern Present (06/04/2020)   [place]    Feeling of Stress : Only a little  Social Connections: Socially Integrated (06/04/2020)   Social Connection and Isolation Panel [NHANES]    Frequency of Communication with Friends and Family: More than three times a week    Frequency of Social Gatherings with Friends and Family: More than three times a week    Attends Religious Services: More than 4 times per year    Active Member of [employer] or Organizations: Yes    Attends Music therapist: More than 4 times per year    Marital Status: Married    Tobacco Counseling Counseling given: Not Answered   Clinical Intake:  Pre-visit preparation completed: Yes     Nutritional Status: BMI > 30  Obese Diabetes: Yes (A1C has increased to 7.3) How often do you need to have someone help you when you read instructions, pamphlets, or other written materials from your doctor or pharmacy?: 1 - Never Interpreter Needed?: No    Activities of Daily Living    02/19/2022    8:28 AM  In your present state of health, do you have any difficulty performing the following activities:  Hearing? 0  Vision? 0  Difficulty concentrating or making decisions? 0  Walking or climbing stairs? 1  Dressing or bathing? 0  Doing errands, shopping? 0  Preparing Food and eating ? N  Using the Toilet? N  In the past six months, have you accidently leaked urine? N  Do you have problems with loss of bowel control? N  Managing your Medications? N  Managing your Finances? N  Housekeeping or managing your Housekeeping? N    Patient Care Team: Rochel Brome, MD as PCP - General (Family Medicine) [place], Ancil Boozer, [place] as Referring Physician (Chiropractic Medicine) Schuyler Amor, PA-C  (Dermatology) Phylliss Blakes, OD (Optometry) Sherlyn Lees, FNP as Nurse Practitioner (Urology)     Assessment:   This is a routine wellness examination for Whitney Eaton.  Hearing/Vision screen No results found.  Dietary issues and exercise activities discussed: Current Exercise Habits: The patient does not participate in regular exercise at present  Depression Screen    02/19/2022    8:26 AM 04/17/2021    3:20 PM 11/01/2020   11:25 AM 10/28/2020    9:53 AM 09/26/2020   11:42 AM 06/04/2020   10:37 AM 09/25/2019   10:39 AM  PHQ 2/9 Scores  PHQ - 2 Score 2 6 0 0 0 0 0  PHQ- 9 Score 9 15         Fall Risk    02/19/2022  8:25 AM 04/17/2021    3:19 PM 11/01/2020   11:25 AM 10/28/2020    9:53 AM 06/04/2020   10:36 AM  Fall Risk   Falls in the past year? _0 0  Number falls in past yr: 1 1 0 0 0  Injury with Fall? 0 1 0 0 0  Risk for fall due to : History of fall(s)  No Fall Risks  No Fall Risks  Follow up Falls evaluation completed;Education provided Falls evaluation completed Falls evaluation completed  Falls evaluation completed;Education provided;Falls prevention discussed    FALL RISK PREVENTION PERTAINING TO THE HOME:  Any stairs in or around the home? Yes  If so, are there any without handrails? No  Home free of loose throw rugs in walkways, pet beds, electrical cords, etc? Yes  Adequate lighting in your home to reduce risk of falls? Yes   ASSISTIVE DEVICES UTILIZED TO PREVENT FALLS:  Life alert? No  Use of a cane, walker or w/c? Yes  Grab bars in the bathroom? No  Shower chair or bench in shower? No  Elevated toilet seat or a handicapped toilet? No   Cognitive Function:        02/19/2022    8:30 AM 06/04/2020    1:23 PM  6CIT Screen  What Year? 0 points 0 points  What month? 0 points 0 points  What time? 0 points 0 points  Count back from 20 0 points 0 points  Months in reverse 0 points 0 points  Repeat phrase 0 points 0 points  Total Score 0 points 0 points     Immunizations Immunization History  Administered Date(s) Administered   Fluad Quad(high Dose 65+) 01/17/2020, 11/01/2020, 02/17/2022   Influenza,inj,quad, With Preservative 12/19/2013, 10/29/2014, 10/31/2015   Influenza-Unspecified 11/27/2011, 01/02/2013, 01/15/2017, 12/16/2017, 01/18/2019   Moderna Sars-Covid-2 Vaccination 04/06/2019, 05/02/2019, 02/21/2020, 07/19/2020   Pneumococcal Conjugate-13 08/05/2016   Pneumococcal Polysaccharide-23 03/16/2012, 11/01/2020   Zoster Recombinat (Shingrix) 08/19/2020, 10/19/2020   Zoster, Live 01/21/2011    TDAP status: Due, Education has been provided regarding the importance of this vaccine. Advised may receive this vaccine at local pharmacy or Health Dept. Aware to provide a copy of the vaccination record if obtained from local pharmacy or Health Dept. Verbalized acceptance and understanding.  Flu Vaccine status: Up to date  Pneumococcal vaccine status: Up to date  Covid-19 vaccine status: Information provided on how to obtain vaccines.   Qualifies for Shingles Vaccine? Yes   Zostavax completed No   Shingrix Completed?: Yes  Screening Tests Health Maintenance  Topic Date Due   DTaP/Tdap/Td (1 - Tdap) Never done   OPHTHALMOLOGY EXAM  08/22/2020   MAMMOGRAM  05/27/2021   Medicare Annual Wellness (AWV)  06/04/2021   COLONOSCOPY (Pts 45-15yr Insurance coverage will need to be confirmed)  06/24/2021   COVID-19 Vaccine (5 - 2023-24 season) 10/31/2021   FOOT EXAM  11/01/2021   HEMOGLOBIN A1C  08/19/2022   Diabetic kidney evaluation - eGFR measurement  02/18/2023   Diabetic kidney evaluation - Urine ACR  02/18/2023   Pneumonia Vaccine 70 Years old  Completed   INFLUENZA VACCINE  Completed   DEXA SCAN  Completed   Hepatitis C Screening  Completed   Zoster Vaccines- Shingrix  Completed   HPV VACCINES  Aged Out    Health Maintenance  Health Maintenance Due  Topic Date Due   DTaP/Tdap/Td (1 - Tdap) Never done   OPHTHALMOLOGY EXAM   08/22/2020   MAMMOGRAM  05/27/2021  Medicare Annual Wellness (AWV)  06/04/2021   COLONOSCOPY (Pts 45-34yr Insurance coverage will need to be confirmed)  06/24/2021   COVID-19 Vaccine (5 - 2023-24 season) 10/31/2021   FOOT EXAM  11/01/2021    Colorectal cancer screening: Type of screening: Colonoscopy. Completed 2013. Repeat every 10 years   Lung Cancer Screening: (Low Dose CT Chest recommended if Age 70-80years, 30 pack-year currently smoking OR have quit w/in 15years.) does not qualify.    Additional Screening:  Hepatitis C Screening: does qualify; Completed 09/2020  Vision Screening: Recommended annual ophthalmology exams for early detection of glaucoma and other disorders of the eye. Is the patient up to date with their annual eye exam?  No   Dental Screening: Recommended annual dental exams for proper oral hygiene  Community Resource Referral / Chronic Care Management: CRR required this visit?  No   CCM required this visit?  No      Plan:    1- Tetanus vaccine - can get at pharmacy 2- I will call patient in February to discuss ordering screenings  I have personally reviewed and noted the following in the patient's chart:   Medical and social history Use of alcohol, tobacco or illicit drugs  Current medications and supplements including opioid prescriptions. Patient is not currently taking opioid prescriptions. Functional ability and status Nutritional status Physical activity Advanced directives List of other physicians Hospitalizations, surgeries, and ER visits in previous 12 months Vitals Screenings to include cognitive, depression, and falls Referrals and appointments  In addition, I have reviewed and discussed with patient certain preventive protocols, quality metrics, and best practice recommendations. A written personalized care plan for preventive services as well as general preventive health recommendations were provided to patient.     KErie Noe LPN   [id number]

## 2022-02-19 NOTE — Patient Instructions (Signed)
Whitney Eaton , Thank you for taking time to come for your Medicare Wellness Visit. I appreciate your ongoing commitment to your health goals. Please review the following plan we discussed and let me know if I can assist you in the future.   Screening recommendations/referrals: Colonoscopy: Due Mammogram: Due Bone Density: Due Recommended yearly ophthalmology/optometry visit for glaucoma screening and checkup Recommended yearly dental visit for hygiene and checkup  Vaccinations: Influenza vaccine: up-to-date Pneumococcal vaccine: up-to-date Tdap vaccine: Due - get this at the pharmacy Shingles vaccine: Complete    Next appointment: I will call you Feb 15 @ 2pm to discuss scheduling some of the screenings you are due for.   Preventive Care 70 Years and Older, Female   Preventive care refers to lifestyle choices and visits with your health care provider that can promote health and wellness.  What does preventive care include? A yearly physical exam. This is also called an annual well check. Dental exams once or twice a year. Routine eye exams. Ask your health care provider how often you should have your eyes checked. Personal lifestyle choices, including: Daily care of your teeth and gums. Regular physical activity. Eating a healthy diet. Avoiding tobacco and drug use. Limiting alcohol use. Practicing safe sex. Taking low-dose aspirin every day. Taking vitamin and mineral supplements as recommended by your health care provider.  What happens during an annual well check? The services and screenings done by your health care provider during your annual well check will depend on your age, overall health, lifestyle risk factors, and family history of disease.  Counseling Your health care provider may ask you questions about your: Alcohol use. Tobacco use. Drug use. Emotional well-being. Home and relationship well-being. Sexual activity. Eating habits. History of falls. Memory  and ability to understand (cognition). Work and work Statistician. Reproductive health.  Screening You may have the following tests or measurements: Height, weight, and BMI. Blood pressure. Lipid and cholesterol levels. These may be checked every 5 years, or more frequently if you are over 59 years old. Skin check. Lung cancer screening. You may have this screening every year starting at age 81 if you have a 30-pack-year history of smoking and currently smoke or have quit within the past 15 years. Fecal occult blood test (FOBT) of the stool. You may have this test every year starting at age 58. Flexible sigmoidoscopy or colonoscopy. You may have a sigmoidoscopy every 5 years or a colonoscopy every 10 years starting at age 51. Hepatitis C blood test. Hepatitis B blood test. Sexually transmitted disease (STD) testing. Diabetes screening. This is done by checking your blood sugar (glucose) after you have not eaten for a while (fasting). You may have this done every 1-3 years. Bone density scan. This is done to screen for osteoporosis. You may have this done starting at age 47. Mammogram. This may be done every 1-2 years. Talk to your health care provider about how often you should have regular mammograms. Talk with your health care provider about your test results, treatment options, and if necessary, the need for more tests.  Vaccines Your health care provider may recommend certain vaccines, such as: Influenza vaccine. This is recommended every year. Tetanus, diphtheria, and acellular pertussis (Tdap, Td) vaccine. You may need a Td booster every 10 years. Zoster vaccine. You may need this after age 84. Pneumococcal 13-valent conjugate (PCV13) vaccine. One dose is recommended after age 24. Pneumococcal polysaccharide (PPSV23) vaccine. One dose is recommended after age 58. Talk to your  health care provider about which screenings and vaccines you need and how often you need them.  This  information is not intended to replace advice given to you by your health care provider. Make sure you discuss any questions you have with your health care provider. Document Released: 03/15/2015 Document Revised: 11/06/2015 Document Reviewed: 12/18/2014 Elsevier Interactive Patient Education  2017 [employer] Prevention in the Home  Falls can cause injuries. They can happen to people of all ages. There are many things you can do to make your home safe and to help prevent falls.  What can I do on the outside of my home? Regularly fix the edges of walkways and driveways and fix any cracks. Remove anything that might make you trip as you walk through a door, such as a raised step or threshold. Trim any bushes or trees on the path to your home. Use bright outdoor lighting. Clear any walking paths of anything that might make someone trip, such as rocks or tools. Regularly check to see if handrails are loose or broken. Make sure that both sides of any steps have handrails. Any raised decks and porches should have guardrails on the edges. Have any leaves, snow, or ice cleared regularly. Use sand or salt on walking paths during winter. Clean up any spills in your garage right away. This includes oil or grease spills.  What can I do in the bathroom? Use night lights. Install grab bars by the toilet and in the tub and shower. Do not use towel bars as grab bars. Use non-skid mats or decals in the tub or shower. If you need to sit down in the shower, use a plastic, non-slip stool. Keep the floor dry. Clean up any water that spills on the floor as soon as it happens. Remove soap buildup in the tub or shower regularly. Attach bath mats securely with double-sided non-slip rug tape. Do not have throw rugs and other things on the floor that can make you trip.  What can I do in the bedroom? Use night lights. Make sure that you have a light by your bed that is easy to reach. Do not use any  sheets or blankets that are too big for your bed. They should not hang down onto the floor. Have a firm chair that has side arms. You can use this for support while you get dressed. Do not have throw rugs and other things on the floor that can make you trip.  What can I do in the kitchen? Clean up any spills right away. Avoid walking on wet floors. Keep items that you use a lot in easy-to-reach places. If you need to reach something above you, use a strong step stool that has a grab bar. Keep electrical cords out of the way. Do not use floor polish or wax that makes floors slippery. If you must use wax, use non-skid floor wax. Do not have throw rugs and other things on the floor that can make you trip.  What can I do with my stairs? Do not leave any items on the stairs. Make sure that there are handrails on both sides of the stairs and use them. Fix handrails that are broken or loose. Make sure that handrails are as long as the stairways. Check any carpeting to make sure that it is firmly attached to the stairs. Fix any carpet that is loose or worn. Avoid having throw rugs at the top or bottom of the stairs.  If you do have throw rugs, attach them to the floor with carpet tape. Make sure that you have a light switch at the top of the stairs and the bottom of the stairs. If you do not have them, ask someone to add them for you.  What else can I do to help prevent falls? Wear shoes that: Do not have high heels. Have rubber bottoms. Are comfortable and fit you well. Are closed at the toe. Do not wear sandals. If you use a stepladder: Make sure that it is fully opened. Do not climb a closed stepladder. Make sure that both sides of the stepladder are locked into place. Ask someone to hold it for you, if possible. Clearly mark and make sure that you can see: Any grab bars or handrails. First and last steps. Where the edge of each step is. Use tools that help you move around (mobility aids)  if they are needed. These include: Canes. Walkers. Scooters. Crutches. Turn on the lights when you go into a dark area. Replace any light bulbs as soon as they burn out. Set up your furniture so you have a clear path. Avoid moving your furniture around. If any of your floors are uneven, fix them. If there are any pets around you, be aware of where they are. Review your medicines with your doctor. Some medicines can make you feel dizzy. This can increase your chance of falling. Ask your doctor what other things that you can do to help prevent falls.  This information is not intended to replace advice given to you by your health care provider. Make sure you discuss any questions you have with your health care provider. Document Released: 12/13/2008 Document Revised: 07/25/2015 Document Reviewed: 03/23/2014 Elsevier Interactive Patient Education  2017 [employer].

## 2022-02-23 ENCOUNTER — Encounter: Payer: Self-pay | Admitting: Family Medicine

## 2022-02-23 NOTE — Assessment & Plan Note (Signed)
Previously well controlled, but much worse.  Thyroid function abnormal. TSH very off. Please confirm if she has been taking her medicine and what dose. This will certainly cause her to feel tired and low energy.  Recheck TSH in 6 weeks.

## 2022-02-23 NOTE — Assessment & Plan Note (Signed)
Recommend follow up with dermatology.

## 2022-02-23 NOTE — Assessment & Plan Note (Signed)
Intolerant to statins. 

## 2022-02-23 NOTE — Assessment & Plan Note (Signed)
Well controlled.  No changes to medicines. Continue Valsartan/hydrochlorothiazide 320/25 mg once daily. Continue to work on eating a healthy diet and exercise.  Labs drawn today.

## 2022-02-23 NOTE — Assessment & Plan Note (Signed)
Not at goal.  Recommend continue to work on eating healthy diet and exercise. Cholesterol: LDL 134, Trigs 259. Recommend repatha 140 mg shot every 2 weeks (aortic atherosclerosis on xray), Recommend vascepa 1 gm 2 capsules twice daily. Checked labs and recommendations are as above.

## 2022-02-23 NOTE — Assessment & Plan Note (Signed)
Vitamin D is low. Recommend vitamin D 50K weekly.

## 2022-02-23 NOTE — Assessment & Plan Note (Signed)
Order sleep apnea supplies.

## 2022-02-23 NOTE — Assessment & Plan Note (Signed)
Potassium 3.1 too low. Change POTASSIUM CHLORIDE packets to POTASSIUM CHLORIDE microtab 10 meq 2 daily.

## 2022-02-23 NOTE — Assessment & Plan Note (Signed)
Control: Poorly Recommend check sugars fasting daily. Recommend check feet daily. Recommend annual eye exams. Medicines: HBA1C: 5.8 increased to 7.3. Poorly controlled. Not spilling protein in urine. Recommend change metformin to xigduo xr 06/998 mg 2 daily in am. Labs drawn today and recommendations as above.

## 2022-02-26 ENCOUNTER — Other Ambulatory Visit: Payer: Self-pay

## 2022-02-26 DIAGNOSIS — E559 Vitamin D deficiency, unspecified: Secondary | ICD-10-CM

## 2022-02-26 DIAGNOSIS — N289 Disorder of kidney and ureter, unspecified: Secondary | ICD-10-CM

## 2022-02-26 DIAGNOSIS — E1169 Type 2 diabetes mellitus with other specified complication: Secondary | ICD-10-CM

## 2022-02-26 DIAGNOSIS — E876 Hypokalemia: Secondary | ICD-10-CM

## 2022-02-26 MED ORDER — VITAMIN D (ERGOCALCIFEROL) 1.25 MG (50000 UNIT) PO CAPS: 50000.0000 [IU] | ORAL_CAPSULE | ORAL | 0 refills | Status: DC

## 2022-02-26 MED ORDER — XIGDUO XR 5-1000 MG PO TB24: 2.0000 | ORAL_TABLET | Freq: Every morning | ORAL | 0 refills | Status: DC

## 2022-02-26 MED ORDER — POTASSIUM CHLORIDE CRYS ER 10 MEQ PO TBCR: 10.0000 meq | EXTENDED_RELEASE_TABLET | Freq: Two times a day (BID) | ORAL | 0 refills | Status: DC

## 2022-03-09 DIAGNOSIS — M1611 Unilateral primary osteoarthritis, right hip: Secondary | ICD-10-CM | POA: Diagnosis not present

## 2022-03-09 DIAGNOSIS — M25551 Pain in right hip: Secondary | ICD-10-CM | POA: Diagnosis not present

## 2022-03-11 ENCOUNTER — Telehealth: Payer: Self-pay

## 2022-03-12 ENCOUNTER — Other Ambulatory Visit: Payer: Self-pay

## 2022-03-12 DIAGNOSIS — M25551 Pain in right hip: Secondary | ICD-10-CM

## 2022-03-18 ENCOUNTER — Other Ambulatory Visit: Payer: Self-pay

## 2022-03-18 DIAGNOSIS — M25551 Pain in right hip: Secondary | ICD-10-CM

## 2022-03-19 ENCOUNTER — Other Ambulatory Visit: Payer: Self-pay | Admitting: Family Medicine

## 2022-03-19 DIAGNOSIS — E038 Other specified hypothyroidism: Secondary | ICD-10-CM

## 2022-04-16 ENCOUNTER — Ambulatory Visit: Payer: Medicare Other

## 2022-04-22 ENCOUNTER — Other Ambulatory Visit: Payer: Self-pay

## 2022-04-22 DIAGNOSIS — E038 Other specified hypothyroidism: Secondary | ICD-10-CM

## 2022-04-22 MED ORDER — LEVOTHYROXINE SODIUM 150 MCG PO TABS: ORAL_TABLET | ORAL | 0 refills | Status: DC

## 2022-05-05 DIAGNOSIS — G4733 Obstructive sleep apnea (adult) (pediatric): Secondary | ICD-10-CM | POA: Diagnosis not present

## 2022-05-07 ENCOUNTER — Telehealth: Payer: Self-pay

## 2022-05-07 NOTE — Progress Notes (Unsigned)
Care Management & Coordination Services Pharmacy Team  Reason for Encounter: General adherence update   Contacted patient for general health update and medication adherence call.  {US HC Outreach:28874}   What concerns do you have about your medications?  The patient {denies/reports:25180} side effects with their medications.   How often do you forget or accidentally miss a dose? {missed doses:25554}  Do you use a pillbox? {yes/no:20286}  Are you having any problems getting your medications from your pharmacy? {yes/no:20286}  Has the cost of your medications been a concern? {yes/no:20286} If yes, what medication and is patient assistance available or has it been applied for?  Since last visit with PharmD, {no/thefollowing:25210} interventions have been made.   The patient {has/has not:25209} had an ED visit since last contact.   The patient {denies/reports:25180} problems with their health.   Patient {denies/reports:25180} concerns or questions for ***, PharmD at this time.   Counseled patient on: {GENERALCOUNSELING:28686}   Chart Updates: Recent office visits:  02-18-2022 Erie Noe, LPN. Medicare wellness visit.  02-17-2022 Rochel Brome, MD. Glucose= 175, Creatinine= 1.06, eGFR= 57, Potassium= 3.1, Chloride= 91. A1C= 7.3. Cholesterol= 223, Trig= 259. VLDL= 46, LDL= 134, Chol/HDL= 5.2. TSH= 66.500. Vit D= 20.4. START celebrex 200 mg daily. STOP Otezla and meloxicam.  Recent consult visits:  None  Hospital visits:  None in previous 6 months  Medications: Outpatient Encounter Medications as of 05/07/2022  Medication Sig   ACCU-CHEK GUIDE test strip USE TO CHECK FASTING BLOOD SUGAR AS DIRECTED   Accu-Chek Softclix Lancets lancets CHECK FASTING BLOOD SUGAR ONCE DAILY   blood glucose meter kit and supplies KIT Check sugars fasting. E11.69   celecoxib (CELEBREX) 200 MG capsule Take 1 capsule (200 mg total) by mouth daily.   Dapagliflozin Pro-metFORMIN ER (XIGDUO  XR) 06-998 MG TB24 Take 2 tablets by mouth every morning.   levothyroxine (SYNTHROID) 150 MCG tablet TAKE 1/2 TABLET(75 MCG) BY MOUTH DAILY   potassium chloride (KLOR-CON M) 10 MEQ tablet Take 1 tablet (10 mEq total) by mouth 2 (two) times daily.   valsartan-hydrochlorothiazide (DIOVAN-HCT) 320-25 MG tablet Take 1 tablet by mouth daily.   Vitamin D, Ergocalciferol, (DRISDOL) 1.25 MG (50000 UNIT) CAPS capsule Take 1 capsule (50,000 Units total) by mouth every 7 (seven) days.   zolpidem (AMBIEN) 10 MG tablet TAKE 1 TABLET(10 MG) BY MOUTH AT BEDTIME   No facility-administered encounter medications on file as of 05/07/2022.    Recent vitals BP Readings from Last 3 Encounters:  02/17/22 132/76  04/17/21 136/90  12/10/20 136/78   Pulse Readings from Last 3 Encounters:  02/17/22 84  04/17/21 84  12/10/20 64   Wt Readings from Last 3 Encounters:  02/17/22 (!) 302 lb (137 kg)  04/17/21 271 lb (122.9 kg)  12/10/20 257 lb (116.6 kg)   BMI Readings from Last 3 Encounters:  02/17/22 48.74 kg/m  04/17/21 43.74 kg/m  12/10/20 39.08 kg/m    Recent lab results    Component Value Date/Time   NA 135 02/17/2022 1214   K 3.1 (L) 02/17/2022 1214   CL 91 (L) 02/17/2022 1214   CO2 28 02/17/2022 1214   GLUCOSE 175 (H) 02/17/2022 1214   BUN 14 02/17/2022 1214   CREATININE 1.06 (H) 02/17/2022 1214   CALCIUM 9.5 02/17/2022 1214    Lab Results  Component Value Date   CREATININE 1.06 (H) 02/17/2022   EGFR 57 (L) 02/17/2022   GFRNONAA 72 04/22/2020   GFRAA 83 04/22/2020   Lab Results  Component  Value Date/Time   HGBA1C 7.3 (H) 02/17/2022 12:14 PM   HGBA1C 5.8 (H) 04/14/2021 09:23 AM   MICROALBUR 10 04/24/2020 06:52 PM   MICROALBUR 10 09/25/2019 11:23 AM    Lab Results  Component Value Date   CHOL 223 (H) 02/17/2022   HDL 43 02/17/2022   LDLCALC 134 (H) 02/17/2022   TRIG 259 (H) 02/17/2022   CHOLHDL 5.2 (H) 02/17/2022   05-07-2022: 1st attempt left VM 05-08-2022: 2nd attempt left  VM  Care Gaps: Annual wellness visit in last year? Yes Tdap overdue Eye exam overdue Mammogram overdue Colonoscopy overdue Covid booster overdue  If Diabetic: Last eye exam / retinopathy screening: Last diabetic foot exam: Last UACR: 02-17-2022  Star Rating Drugs:  Valsartan/HCTZ 360-25 mg- Last filled 02-18-2022 90 DS. Previous 01-26-2022 60 DS. Xigduo XR 06-998 mg- Last filled 02-26-2022 90 DS. No previous fills  [employer] manager [phone]

## 2022-05-22 ENCOUNTER — Ambulatory Visit: Payer: Medicare Other | Admitting: Family Medicine

## 2022-07-25 DIAGNOSIS — G4733 Obstructive sleep apnea (adult) (pediatric): Secondary | ICD-10-CM | POA: Diagnosis not present

## 2022-10-21 DIAGNOSIS — G4733 Obstructive sleep apnea (adult) (pediatric): Secondary | ICD-10-CM | POA: Diagnosis not present

## 2022-11-06 ENCOUNTER — Other Ambulatory Visit: Payer: Self-pay

## 2022-11-06 DIAGNOSIS — E038 Other specified hypothyroidism: Secondary | ICD-10-CM

## 2022-11-06 DIAGNOSIS — I152 Hypertension secondary to endocrine disorders: Secondary | ICD-10-CM

## 2022-11-06 DIAGNOSIS — E1159 Type 2 diabetes mellitus with other circulatory complications: Secondary | ICD-10-CM

## 2022-11-09 MED ORDER — LEVOTHYROXINE SODIUM 150 MCG PO TABS
ORAL_TABLET | ORAL | 0 refills | Status: DC
Start: 2022-11-09 — End: 2023-02-18

## 2022-11-09 MED ORDER — METFORMIN HCL 1000 MG PO TABS
1000.0000 mg | ORAL_TABLET | Freq: Two times a day (BID) | ORAL | 0 refills | Status: DC
Start: 2022-11-09 — End: 2023-01-21

## 2022-11-09 MED ORDER — VALSARTAN-HYDROCHLOROTHIAZIDE 320-25 MG PO TABS
1.0000 | ORAL_TABLET | Freq: Every day | ORAL | 1 refills | Status: DC
Start: 2022-11-09 — End: 2023-03-08

## 2022-12-10 ENCOUNTER — Ambulatory Visit: Payer: Medicare Other | Admitting: Family Medicine

## 2022-12-10 NOTE — Assessment & Plan Note (Deleted)
Well controlled.  No changes to medicines. Continue Valsartan/hydrochlorothiazide 320/25 mg once daily. Continue to work on eating a healthy diet and exercise.  Labs drawn today.

## 2022-12-10 NOTE — Assessment & Plan Note (Deleted)
a 

## 2023-01-04 ENCOUNTER — Telehealth: Payer: Self-pay

## 2023-01-04 NOTE — Patient Outreach (Signed)
Successful call to patient on today regarding preventative mammogram screening. Patient declined at this time and will call back later in the week.  Baruch Gouty [facility] Assistant [facility] Population Health [phone]

## 2023-01-06 ENCOUNTER — Telehealth: Payer: Self-pay

## 2023-01-06 NOTE — Patient Outreach (Signed)
[facility] Assistant attempted to call patient on today regarding preventative mammogram screening. No answer from patient after multiple rings. Assistant left confidential voicemail for patient to return call.   Baruch Gouty [facility] Assistant [facility] Population Health [phone]

## 2023-01-15 DIAGNOSIS — H6123 Impacted cerumen, bilateral: Secondary | ICD-10-CM | POA: Diagnosis not present

## 2023-01-15 DIAGNOSIS — R0981 Nasal congestion: Secondary | ICD-10-CM | POA: Diagnosis not present

## 2023-01-15 DIAGNOSIS — R051 Acute cough: Secondary | ICD-10-CM | POA: Diagnosis not present

## 2023-01-15 DIAGNOSIS — J019 Acute sinusitis, unspecified: Secondary | ICD-10-CM | POA: Diagnosis not present

## 2023-01-15 DIAGNOSIS — H6691 Otitis media, unspecified, right ear: Secondary | ICD-10-CM | POA: Diagnosis not present

## 2023-01-16 DIAGNOSIS — I959 Hypotension, unspecified: Secondary | ICD-10-CM | POA: Diagnosis not present

## 2023-01-16 DIAGNOSIS — E1165 Type 2 diabetes mellitus with hyperglycemia: Secondary | ICD-10-CM | POA: Diagnosis not present

## 2023-01-16 DIAGNOSIS — R9431 Abnormal electrocardiogram [ECG] [EKG]: Secondary | ICD-10-CM | POA: Diagnosis not present

## 2023-01-16 DIAGNOSIS — R739 Hyperglycemia, unspecified: Secondary | ICD-10-CM | POA: Diagnosis not present

## 2023-01-16 DIAGNOSIS — S8010XA Contusion of unspecified lower leg, initial encounter: Secondary | ICD-10-CM | POA: Diagnosis not present

## 2023-01-16 DIAGNOSIS — Z743 Need for continuous supervision: Secondary | ICD-10-CM | POA: Diagnosis not present

## 2023-01-16 DIAGNOSIS — S0990XA Unspecified injury of head, initial encounter: Secondary | ICD-10-CM | POA: Diagnosis not present

## 2023-01-16 DIAGNOSIS — I700 Atherosclerosis of aorta: Secondary | ICD-10-CM | POA: Diagnosis not present

## 2023-01-16 DIAGNOSIS — W19XXXA Unspecified fall, initial encounter: Secondary | ICD-10-CM | POA: Diagnosis not present

## 2023-01-16 DIAGNOSIS — Z043 Encounter for examination and observation following other accident: Secondary | ICD-10-CM | POA: Diagnosis not present

## 2023-01-16 DIAGNOSIS — M549 Dorsalgia, unspecified: Secondary | ICD-10-CM | POA: Diagnosis not present

## 2023-01-16 DIAGNOSIS — M25551 Pain in right hip: Secondary | ICD-10-CM | POA: Diagnosis not present

## 2023-01-16 DIAGNOSIS — M25562 Pain in left knee: Secondary | ICD-10-CM | POA: Diagnosis not present

## 2023-01-16 DIAGNOSIS — S199XXA Unspecified injury of neck, initial encounter: Secondary | ICD-10-CM | POA: Diagnosis not present

## 2023-01-16 DIAGNOSIS — M160 Bilateral primary osteoarthritis of hip: Secondary | ICD-10-CM | POA: Diagnosis not present

## 2023-01-17 DIAGNOSIS — Z91148 Patient's other noncompliance with medication regimen for other reason: Secondary | ICD-10-CM | POA: Diagnosis not present

## 2023-01-17 DIAGNOSIS — S0990XA Unspecified injury of head, initial encounter: Secondary | ICD-10-CM | POA: Diagnosis not present

## 2023-01-17 DIAGNOSIS — S8010XA Contusion of unspecified lower leg, initial encounter: Secondary | ICD-10-CM | POA: Diagnosis not present

## 2023-01-17 DIAGNOSIS — E876 Hypokalemia: Secondary | ICD-10-CM | POA: Diagnosis not present

## 2023-01-17 DIAGNOSIS — S199XXA Unspecified injury of neck, initial encounter: Secondary | ICD-10-CM | POA: Diagnosis not present

## 2023-01-17 DIAGNOSIS — G9341 Metabolic encephalopathy: Secondary | ICD-10-CM | POA: Diagnosis not present

## 2023-01-17 DIAGNOSIS — B9689 Other specified bacterial agents as the cause of diseases classified elsewhere: Secondary | ICD-10-CM | POA: Diagnosis not present

## 2023-01-17 DIAGNOSIS — Z79899 Other long term (current) drug therapy: Secondary | ICD-10-CM | POA: Diagnosis not present

## 2023-01-17 DIAGNOSIS — I517 Cardiomegaly: Secondary | ICD-10-CM | POA: Diagnosis not present

## 2023-01-17 DIAGNOSIS — M160 Bilateral primary osteoarthritis of hip: Secondary | ICD-10-CM | POA: Diagnosis not present

## 2023-01-17 DIAGNOSIS — E1165 Type 2 diabetes mellitus with hyperglycemia: Secondary | ICD-10-CM | POA: Diagnosis not present

## 2023-01-17 DIAGNOSIS — Z792 Long term (current) use of antibiotics: Secondary | ICD-10-CM | POA: Diagnosis not present

## 2023-01-17 DIAGNOSIS — E872 Acidosis, unspecified: Secondary | ICD-10-CM | POA: Diagnosis not present

## 2023-01-17 DIAGNOSIS — T796XXA Traumatic ischemia of muscle, initial encounter: Secondary | ICD-10-CM | POA: Diagnosis not present

## 2023-01-17 DIAGNOSIS — G4733 Obstructive sleep apnea (adult) (pediatric): Secondary | ICD-10-CM | POA: Diagnosis not present

## 2023-01-17 DIAGNOSIS — Z794 Long term (current) use of insulin: Secondary | ICD-10-CM | POA: Diagnosis not present

## 2023-01-17 DIAGNOSIS — R9431 Abnormal electrocardiogram [ECG] [EKG]: Secondary | ICD-10-CM | POA: Diagnosis not present

## 2023-01-17 DIAGNOSIS — I10 Essential (primary) hypertension: Secondary | ICD-10-CM | POA: Diagnosis not present

## 2023-01-17 DIAGNOSIS — I700 Atherosclerosis of aorta: Secondary | ICD-10-CM | POA: Diagnosis not present

## 2023-01-17 DIAGNOSIS — M25562 Pain in left knee: Secondary | ICD-10-CM | POA: Diagnosis not present

## 2023-01-17 DIAGNOSIS — W19XXXA Unspecified fall, initial encounter: Secondary | ICD-10-CM | POA: Diagnosis not present

## 2023-01-17 DIAGNOSIS — R7989 Other specified abnormal findings of blood chemistry: Secondary | ICD-10-CM | POA: Diagnosis not present

## 2023-01-17 DIAGNOSIS — Z043 Encounter for examination and observation following other accident: Secondary | ICD-10-CM | POA: Diagnosis not present

## 2023-01-17 DIAGNOSIS — E039 Hypothyroidism, unspecified: Secondary | ICD-10-CM | POA: Diagnosis not present

## 2023-01-17 DIAGNOSIS — J019 Acute sinusitis, unspecified: Secondary | ICD-10-CM | POA: Diagnosis not present

## 2023-01-17 DIAGNOSIS — E860 Dehydration: Secondary | ICD-10-CM | POA: Diagnosis not present

## 2023-01-19 DIAGNOSIS — I517 Cardiomegaly: Secondary | ICD-10-CM | POA: Diagnosis not present

## 2023-01-21 ENCOUNTER — Telehealth: Payer: Self-pay

## 2023-01-21 ENCOUNTER — Other Ambulatory Visit: Payer: Self-pay | Admitting: Family Medicine

## 2023-01-21 MED ORDER — ACCU-CHEK SOFTCLIX LANCETS MISC: 3 refills | Status: DC

## 2023-01-21 NOTE — Telephone Encounter (Signed)
Copied from CRM [id number]. Topic: Clinical - Medication Refill >> Jan 21, 2023  2:05 PM Maxwell Marion wrote: Most Recent Primary Care Visit:  Provider: Jacklynn Bue  Department: [facility]  Visit Type: MEDICARE AWV, SEQUENTIAL  Date: 02/18/2022  Medication: ***  Has the patient contacted their pharmacy?  (Agent: If no, request that the patient contact the pharmacy for the refill. If patient does not wish to contact the pharmacy document the reason why and proceed with request.) (Agent: If yes, when and what did the pharmacy advise?)  Is this the correct pharmacy for this prescription?  If no, delete pharmacy and type the correct one.  This is the patient's preferred pharmacy:  [id number], [place] - [address] [place] [address] Phone: [phone] Fax: [phone]   Has the prescription been filled recently?   Is the patient out of the medication?   Has the patient been seen for an appointment in the last year OR does the patient have an upcoming appointment?   Can we respond through MyChart?   Agent: Please be advised that Rx refills may take up to 3 business days. We ask that you follow-up with your pharmacy.

## 2023-01-21 NOTE — Transitions of Care (Post Inpatient/ED Visit) (Signed)
01/21/2023  Name: Whitney Eaton MRN: [id number] DOB: 08/24/1950  Today's TOC FU Call Status: Today's TOC FU Call Status:: Successful TOC FU Call Completed TOC FU Call Complete Date: 01/21/23 Patient's Name and Date of Birth confirmed.  Transition Care Management Follow-up Telephone Call Date of Discharge: 01/20/23 Discharge Facility: Other ([facility]) Name of Other ([facility]) Discharge Facility: [facility] Type of Discharge: Inpatient Admission Primary Inpatient Discharge Diagnosis:: Rhabdomyolysis, Diabetes, Sinusitis How have you been since you were released from the hospital?: Better Any questions or concerns?: No  Patient admitted to [facility] on 01/17/2023 after a fall at home landing on her left side.  Patient was unable to get up and was on the floor for aprox 5 hours.   -Labs done in the ED significant for blood sugar of 570, CK 1180, Troponin 0.07 then subsequent 0.06, UA: Glucose 4+, Ketones 3+, Nitrates 2+ -EKG demonstrated sinus rhythm without ST or T-wave change -No ACUTE findings in the CT head/C-Spine or CT of the Chest Treated in the ED with a couple of liters of IV fluid boluses, a dose of Zosyn, and 8 units of insulin. -Zosyn discontinued after urine culture negative  After admission staff noticed blank stares so EEG was done and was negative for epileptiform activity. -ECHO done with normal EF with diastolic dysfunction noted -Patient received diabetic education during admission as her A1C was 14 (patient not taking metformin due to GI intolerance)  See discharge summary in MEDIA for further information  Items Reviewed: Did you receive and understand the discharge instructions provided?: Yes Medications obtained,verified, and reconciled?: Yes (Medications Reviewed) Any new allergies since your discharge?: No Dietary orders reviewed?: Yes Type of Diet Ordered:: Diabetic Do you have support at home?: Yes People in Home: spouse  Medications  Reviewed Today: Medications Reviewed Today     Reviewed by Jacklynn Bue, LPN (Licensed Practical Nurse) on 01/21/23 at 1058  Med List Status: <None>   Medication Order Taking? Sig Documenting Provider Last Dose Status Informant  ACCU-CHEK GUIDE test strip [id number] Yes USE TO CHECK FASTING BLOOD SUGAR AS DIRECTED Marianne Sofia, PA-C Taking Active   Accu-Chek Softclix Lancets lancets [id number] Yes CHECK FASTING BLOOD SUGAR ONCE DAILY Cox, Kirsten, MD Taking Active   amoxicillin-clavulanate (AUGMENTIN) 875-125 MG tablet [id number] Yes Take 1 tablet by mouth 2 (two) times daily. [provider] Taking Active   blood glucose meter kit and supplies KIT [id number] Yes Check sugars fasting. E11.69 CoxFritzi Mandes, MD Taking Active   celecoxib (CELEBREX) 200 MG capsule [id number] No Take 1 capsule (200 mg total) by mouth daily.  Patient not taking: Reported on 01/21/2023   CoxFritzi Mandes, MD Not Taking Active   Dapagliflozin Pro-metFORMIN ER (XIGDUO XR) 06-998 MG TB24 [id number] No Take 2 tablets by mouth every morning.  Patient not taking: Reported on 01/21/2023   Blane Ohara, MD Not Taking Active   LANTUS SOLOSTAR 100 UNIT/ML Solostar Pen [id number] Yes Inject 35 Units into the skin daily. [provider] Taking Active   levothyroxine (SYNTHROID) 150 MCG tablet [id number] Yes TAKE 1/2 TABLET(75 MCG) BY MOUTH DAILY Sirivol, Mamatha, MD Taking Active   metFORMIN (GLUCOPHAGE) 1000 MG tablet [id number] No Take 1 tablet (1,000 mg total) by mouth 2 (two) times daily with a meal.  Patient not taking: Reported on 01/21/2023   Windell Moment, MD Not Taking Active   potassium chloride (KLOR-CON M) 10 MEQ tablet [id number] Yes Take 1 tablet (10 mEq total) by mouth 2 (two) times daily. Cox,  Kirsten, MD Taking Active   valsartan-hydrochlorothiazide (DIOVAN-HCT) 320-25 MG tablet [id number] Yes Take 1 tablet by mouth daily. Windell Moment, MD Taking Active   Vitamin D, Ergocalciferol, (DRISDOL) 1.25 MG  (50000 UNIT) CAPS capsule [id number] No Take 1 capsule (50,000 Units total) by mouth every 7 (seven) days.  Patient not taking: Reported on 01/21/2023   CoxFritzi Mandes, MD Not Taking Active   zolpidem (AMBIEN) 10 MG tablet [id number] No TAKE 1 TABLET(10 MG) BY MOUTH AT BEDTIME  Patient not taking: Reported on 01/21/2023   Blane Ohara, MD Not Taking Active             Home Care and Equipment/Supplies: Were Home Health Services Ordered?: Yes Name of Home Health Agency:: [facility] Has Agency set up a time to come to your home?: Yes First Home Health Visit Date: 01/22/23 Any new equipment or medical supplies ordered?: No  Functional Questionnaire: Do you need assistance with bathing/showering or dressing?: No Do you need assistance with meal preparation?: No Do you need assistance with eating?: No Do you have difficulty maintaining continence: No Do you need assistance with getting out of bed/getting out of a chair/moving?: No Do you have difficulty managing or taking your medications?: No  Follow up appointments reviewed: PCP Follow-up appointment confirmed?: Yes Date of PCP follow-up appointment?: 01/25/23 Follow-up Provider: [facility] Follow-up appointment confirmed?: NA Do you need transportation to your follow-up appointment?: No Do you understand care options if your condition(s) worsen?: Yes-patient verbalized understanding  SDOH Interventions Today    Flowsheet Row Most Recent Value  SDOH Interventions   Food Insecurity Interventions Intervention Not Indicated  Housing Interventions Intervention Not Indicated  Transportation Interventions Intervention Not Indicated  Utilities Interventions Intervention Not Indicated  Alcohol Usage Interventions Intervention Not Indicated (Score <7)       SIGNATURE: Elmyra Ricks, LPN

## 2023-01-22 DIAGNOSIS — M47812 Spondylosis without myelopathy or radiculopathy, cervical region: Secondary | ICD-10-CM | POA: Diagnosis not present

## 2023-01-22 DIAGNOSIS — I10 Essential (primary) hypertension: Secondary | ICD-10-CM | POA: Diagnosis not present

## 2023-01-22 DIAGNOSIS — G4700 Insomnia, unspecified: Secondary | ICD-10-CM | POA: Diagnosis not present

## 2023-01-22 DIAGNOSIS — E039 Hypothyroidism, unspecified: Secondary | ICD-10-CM | POA: Diagnosis not present

## 2023-01-22 DIAGNOSIS — J9811 Atelectasis: Secondary | ICD-10-CM | POA: Diagnosis not present

## 2023-01-22 DIAGNOSIS — E876 Hypokalemia: Secondary | ICD-10-CM | POA: Diagnosis not present

## 2023-01-22 DIAGNOSIS — M1610 Unilateral primary osteoarthritis, unspecified hip: Secondary | ICD-10-CM | POA: Diagnosis not present

## 2023-01-22 DIAGNOSIS — E1165 Type 2 diabetes mellitus with hyperglycemia: Secondary | ICD-10-CM | POA: Diagnosis not present

## 2023-01-22 DIAGNOSIS — G4733 Obstructive sleep apnea (adult) (pediatric): Secondary | ICD-10-CM | POA: Diagnosis not present

## 2023-01-22 DIAGNOSIS — Z792 Long term (current) use of antibiotics: Secondary | ICD-10-CM | POA: Diagnosis not present

## 2023-01-22 DIAGNOSIS — Z9181 History of falling: Secondary | ICD-10-CM | POA: Diagnosis not present

## 2023-01-22 DIAGNOSIS — J019 Acute sinusitis, unspecified: Secondary | ICD-10-CM | POA: Diagnosis not present

## 2023-01-22 DIAGNOSIS — I21A1 Myocardial infarction type 2: Secondary | ICD-10-CM | POA: Diagnosis not present

## 2023-01-22 DIAGNOSIS — B9689 Other specified bacterial agents as the cause of diseases classified elsewhere: Secondary | ICD-10-CM | POA: Diagnosis not present

## 2023-01-22 DIAGNOSIS — E872 Acidosis, unspecified: Secondary | ICD-10-CM | POA: Diagnosis not present

## 2023-01-22 DIAGNOSIS — M1712 Unilateral primary osteoarthritis, left knee: Secondary | ICD-10-CM | POA: Diagnosis not present

## 2023-01-22 DIAGNOSIS — Z794 Long term (current) use of insulin: Secondary | ICD-10-CM | POA: Diagnosis not present

## 2023-01-25 ENCOUNTER — Ambulatory Visit (INDEPENDENT_AMBULATORY_CARE_PROVIDER_SITE_OTHER): Payer: Medicare Other

## 2023-01-25 VITALS — BP 125/80 | HR 74 | Temp 97.6°F | Ht 66.0 in | Wt 289.0 lb

## 2023-01-25 DIAGNOSIS — E038 Other specified hypothyroidism: Secondary | ICD-10-CM

## 2023-01-25 DIAGNOSIS — J0100 Acute maxillary sinusitis, unspecified: Secondary | ICD-10-CM | POA: Insufficient documentation

## 2023-01-25 DIAGNOSIS — E1159 Type 2 diabetes mellitus with other circulatory complications: Secondary | ICD-10-CM

## 2023-01-25 DIAGNOSIS — G4733 Obstructive sleep apnea (adult) (pediatric): Secondary | ICD-10-CM | POA: Diagnosis not present

## 2023-01-25 DIAGNOSIS — G9341 Metabolic encephalopathy: Secondary | ICD-10-CM | POA: Insufficient documentation

## 2023-01-25 DIAGNOSIS — E66813 Obesity, class 3: Secondary | ICD-10-CM

## 2023-01-25 DIAGNOSIS — E1165 Type 2 diabetes mellitus with hyperglycemia: Secondary | ICD-10-CM | POA: Insufficient documentation

## 2023-01-25 DIAGNOSIS — I152 Hypertension secondary to endocrine disorders: Secondary | ICD-10-CM

## 2023-01-25 DIAGNOSIS — Z6841 Body Mass Index (BMI) 40.0 and over, adult: Secondary | ICD-10-CM

## 2023-01-25 DIAGNOSIS — T796XXA Traumatic ischemia of muscle, initial encounter: Secondary | ICD-10-CM | POA: Insufficient documentation

## 2023-01-25 MED ORDER — TIRZEPATIDE 2.5 MG/0.5ML ~~LOC~~ SOAJ
2.5000 mg | SUBCUTANEOUS | 2 refills | Status: DC
Start: 2023-01-25 — End: 2023-03-22

## 2023-01-25 NOTE — Assessment & Plan Note (Signed)
Resolved metabolic encephalopathy secondary to hyperglycemia and possible UTI. Lethargy improved with IV fluids and insulin therapy. Urine cultures were negative, leading to discontinuation of Zosyn. - Continue monitoring blood glucose levels - Follow up with primary care physician for further management

## 2023-01-25 NOTE — Assessment & Plan Note (Signed)
Hypothyroidism with recent TSH level of 64. Currently taking 75 mcg of levothyroxine. - Check TSH levels at the next appointment with Dr. Sedalia Muta - Adjust levothyroxine dose based on TSH results

## 2023-01-25 NOTE — Progress Notes (Signed)
Subjective:  Patient ID: Frankey Shown, female    DOB: Nov 18, 1950  Age: 72 y.o. MRN: [id number]  Chief Complaint  Patient presents with   accidental fall with progressive weakness    HPI The pt was admitted into 01/17/2023 and discharged on 01/20/2023.  Alena, a 72 year old individual, presents for a follow-up after a recent hospital admission due to metabolic encephalopathy and rhabdomyolysis. The hospitalization was prompted by an accidental fall at home, during which Lameisha remained on the floor for approximately five hours, unable to get up. The fall resulted in left knee and lower extremity pain, rendering Alixis unable to bear weight on the left lower extremity.    Pt said that she was also dx with low potassium, sinusitis, and unknown name of dx for inflamed muscles?  Pt stated that she was seen at [facility] in [place] on Friday for a sinus congestion/pressure, and vision issues. Augmentin but was discontinued once she was admitted at the hospital. [facility] [place]. New rx: lantus pen 35 units (20 units this morning-she was unsure) Humulin r sliding scale and potassium chloride 10 meq po daily   In the hospital, Gudrun's blood work revealed an elevated white cell count, high blood sugar, elevated total bilirubin, and increased lactic acid and creatinine kinase levels. Urinalysis was positive for glucose, ketones, and nitrites. A CT scan of the head and spine showed chronic cervical degenerative changes but no acute intracranial abnormality. Tenise received IV fluid boluses, ibuprofen, and insulin for elevated blood sugars.  Providencia's altered mental status reportedly resolved within a day of being in the hospital. It was noted that Delesia had been noncompliant with oral metformin therapy for diabetes due to gastrointestinal side effects and had not been taking it for several months. Porsche was also observed to have blank stares, prompting an EEG, which ruled out seizure-like  activity.  During the hospital stay, Christionna complained of severe right-sided earache and pressure in the right maxillary sinus area, leading to a diagnosis of possible acute bacterial maxillary sinusitis and a prescription for Augmentin.  Cumi reports feeling much better since the hospital stay. The fall was due to slipping off the bed while trying to put on pants. Summit reports being disoriented at the time of the fall and experiencing extreme thirst. Dandra denies losing consciousness during the fall.  Dayane has been noncompliant with metformin for diabetes management due to gastrointestinal side effects. Frederika's blood sugar levels have been high, and she is now on insulin therapy. Amyria also reports frequent urination and is currently experiencing discomfort in the lower back and hip area. Arnola sleeps in a recliner due to pain when lying in bed and is currently experiencing swelling, which is improving with the use of compression stockings.        02/19/2022    8:26 AM 04/17/2021    3:20 PM 11/01/2020   11:25 AM 10/28/2020    9:53 AM 09/26/2020   11:42 AM  Depression screen PHQ 2/9  Decreased Interest 1 3 0 0 0  Down, Depressed, Hopeless 1 3 0 0 0  PHQ - 2 Score 2 6 0 0 0  Altered sleeping 2 2     Tired, decreased energy 3 3     Change in appetite 1 2     Feeling bad or failure about yourself  1 2     Trouble concentrating 0 0     Moving slowly or fidgety/restless 0 0     Suicidal thoughts 0  0     PHQ-9 Score 9 15     Difficult doing work/chores Somewhat difficult Very difficult           02/19/2022    8:25 AM  Fall Risk   Falls in the past year? 1  Number falls in past yr: 1  Injury with Fall? 0  Risk for fall due to : History of fall(s)  Follow up Falls evaluation completed;Education provided    Patient Care Team: Blane Ohara, MD as PCP - General (Family Medicine) Denamur, Darin, DC as Referring Physician (Chiropractic Medicine) Jonell Cluck, PA-C  (Dermatology) Yvette Rack, OD (Optometry) Prescilla Sours, FNP as Nurse Practitioner (Urology)   Review of Systems  Constitutional: Negative.   HENT: Negative.    Eyes: Negative.   Respiratory: Negative.    Cardiovascular: Negative.   Musculoskeletal:  Positive for arthralgias and back pain.  Neurological: Negative.   Psychiatric/Behavioral: Negative.    All other systems reviewed and are negative.   Current Outpatient Medications on File Prior to Visit  Medication Sig Dispense Refill   ACCU-CHEK GUIDE test strip USE TO CHECK FASTING BLOOD SUGAR AS DIRECTED 50 strip 4   Accu-Chek Softclix Lancets lancets CHECK FASTING BLOOD SUGAR ONCE DAILY 100 each 3   blood glucose meter kit and supplies KIT Check sugars fasting. E11.69 1 each 0   LANTUS SOLOSTAR 100 UNIT/ML Solostar Pen Inject 35 Units into the skin daily.     levothyroxine (SYNTHROID) 150 MCG tablet TAKE 1/2 TABLET(75 MCG) BY MOUTH DAILY 45 tablet 0   potassium chloride (KLOR-CON M) 10 MEQ tablet Take 1 tablet (10 mEq total) by mouth 2 (two) times daily. 180 tablet 0   valsartan-hydrochlorothiazide (DIOVAN-HCT) 320-25 MG tablet Take 1 tablet by mouth daily. 90 tablet 1   Vitamin D, Ergocalciferol, (DRISDOL) 1.25 MG (50000 UNIT) CAPS capsule Take 1 capsule (50,000 Units total) by mouth every 7 (seven) days. 12 capsule 0   amoxicillin-clavulanate (AUGMENTIN) 875-125 MG tablet Take 1 tablet by mouth 2 (two) times daily. (Patient not taking: Reported on 01/25/2023)     celecoxib (CELEBREX) 200 MG capsule Take 1 capsule (200 mg total) by mouth daily. (Patient not taking: Reported on 01/21/2023) 90 capsule 1   zolpidem (AMBIEN) 10 MG tablet TAKE 1 TABLET(10 MG) BY MOUTH AT BEDTIME (Patient not taking: Reported on 01/21/2023) 30 tablet 5   No current facility-administered medications on file prior to visit.   Past Medical History:  Diagnosis Date   Asthma    Diabetes (HCC)    Hyperlipemia    Hypertension    Psoriasis    Sleep apnea     Thyroid disease    Past Surgical History:  Procedure Laterality Date   ABDOMINAL HYSTERECTOMY     CHOLECYSTECTOMY  2011   HYSTERECTOMY ABDOMINAL WITH SALPINGECTOMY     OOPHORECTOMY  1980    Family History  Problem Relation Age of Onset   Alzheimer's disease Mother    Breast cancer Mother    Arthritis Mother    Diabetes Mother    Eczema Father    CAD Father    Diabetes Father    Parkinson's disease Father    Breast cancer Sister    Neurologic Disorder Brother    Glaucoma Maternal Grandmother    Heart disease Other    Social History   Socioeconomic History   Marital status: Married    Spouse name: Not on file   Number of children: 2   Years of education:  Not on file   Highest education level: Not on file  Occupational History   Occupation: homemaker  Tobacco Use   Smoking status: Never   Smokeless tobacco: Never  Vaping Use   Vaping status: Never Used  Substance and Sexual Activity   Alcohol use: Never   Drug use: Never   Sexual activity: Not on file  Other Topics Concern   Not on file  Social History Narrative   Not on file   Social Determinants of Health   Financial Resource Strain: Low Risk  (06/04/2020)   Overall Financial Resource Strain (CARDIA)    Difficulty of Paying Living Expenses: Not hard at all  Food Insecurity: No Food Insecurity (01/21/2023)   Hunger Vital Sign    Worried About Running Out of Food in the Last Year: Never true    Ran Out of Food in the Last Year: Never true  Transportation Needs: No Transportation Needs (01/21/2023)   PRAPARE - Administrator, Civil Service (Medical): No    Lack of Transportation (Non-Medical): No  Physical Activity: Insufficiently Active (06/04/2020)   Exercise Vital Sign    Days of Exercise per Week: 4 days    Minutes of Exercise per Session: 20 min  Stress: No Stress Concern Present (06/04/2020)   [employer] of Occupational Health - Occupational Stress Questionnaire    Feeling of Stress  : Only a little  Social Connections: Socially Integrated (06/04/2020)   Social Connection and Isolation Panel [NHANES]    Frequency of Communication with Friends and Family: More than three times a week    Frequency of Social Gatherings with Friends and Family: More than three times a week    Attends Religious Services: More than 4 times per year    Active Member of [employer] or Organizations: Yes    Attends Engineer, structural: More than 4 times per year    Marital Status: Married    Objective:  BP 125/80   Pulse 74   Temp 97.6 F (36.4 C)   Ht 5\' 6"  (1.676 m)   Wt 289 lb (131.1 kg)   SpO2 96%   BMI 46.65 kg/m      01/25/2023    9:12 AM 02/17/2022   11:27 AM 04/17/2021    3:10 PM  BP/Weight  Systolic BP 125 132 136  Diastolic BP 80 76 90  Wt. (Lbs) 289 302 271  BMI 46.65 kg/m2 48.74 kg/m2 43.74 kg/m2    Physical Exam Vitals and nursing note reviewed.  Constitutional:      Appearance: She is obese.  HENT:     Head: Normocephalic and atraumatic.     Mouth/Throat:     Mouth: Mucous membranes are moist.  Cardiovascular:     Rate and Rhythm: Normal rate and regular rhythm.  Pulmonary:     Effort: Pulmonary effort is normal.     Breath sounds: Normal breath sounds.  Musculoskeletal:        General: Normal range of motion.     Comments: Compression stockings noted  Skin:    General: Skin is warm.  Neurological:     General: No focal deficit present.     Mental Status: She is alert.  Psychiatric:        Mood and Affect: Mood normal.     Diabetic Foot Exam - Simple   No data filed      Lab Results  Component Value Date   WBC 9.0 02/17/2022   HGB 13.2  02/17/2022   HCT 38.5 02/17/2022   PLT 246 02/17/2022   GLUCOSE 175 (H) 02/17/2022   CHOL 223 (H) 02/17/2022   TRIG 259 (H) 02/17/2022   HDL 43 02/17/2022   LDLCALC 134 (H) 02/17/2022   ALT 16 02/17/2022   AST 17 02/17/2022   NA 135 02/17/2022   K 3.1 (L) 02/17/2022   CL 91 (L) 02/17/2022    CREATININE 1.06 (H) 02/17/2022   BUN 14 02/17/2022   CO2 28 02/17/2022   TSH 66.500 (H) 02/17/2022   HGBA1C 7.3 (H) 02/17/2022   MICROALBUR 10 04/24/2020      Assessment & Plan:    Metabolic encephalopathy Assessment & Plan: Resolved metabolic encephalopathy secondary to hyperglycemia and possible UTI. Lethargy improved with IV fluids and insulin therapy. Urine cultures were negative, leading to discontinuation of Zosyn. - Continue monitoring blood glucose levels - Follow up with primary care physician for further management   Traumatic rhabdomyolysis, initial encounter [facility]) Assessment & Plan: Resolved rhabdomyolysis due to prolonged immobility after a fall. Treated with hydration in the hospital. No acute intracranial abnormality on CT scan. - Continue physical therapy to prevent future falls - Monitor for any signs of muscle pain or weakness    Hypertension associated with diabetes (HCC) Assessment & Plan: Well controlled on Valsartan 320 mg-hydrochlorothiazide 25 mg    Obstructive sleep apnea Assessment & Plan: Obstructive sleep apnea managed with CPAP. Prefers [facility] for CPAP supplies. - Continue using CPAP machine - Coordinate with [facility] for CPAP supplies   Other specified hypothyroidism Assessment & Plan: Hypothyroidism with recent TSH level of 64. Currently taking 75 mcg of levothyroxine. - Check TSH levels at the next appointment with Dr. Sedalia Muta - Adjust levothyroxine dose based on TSH results   Uncontrolled type 2 diabetes mellitus with hyperglycemia (HCC) Assessment & Plan: Uncontrolled diabetes with recent A1c of 14.1. Noncompliance with metformin due to GI side effects. Currently on insulin therapy (Lantus 35 units AM, Humulin R before meals and at bedtime). Blood glucose levels remain elevated. Discussed potential benefits of GLP-1 medications like Mounjaro for weight loss and better glycemic control. Risks include nausea,  vomiting, constipation, and diarrhea. Insurance coverage for GLP-1 medications is uncertain. - Prescribe Mounjaro at a low dose and monitor for side effects - Continue current insulin regimen - Monitor blood glucose levels regularly - Encourage dietary modifications and regular exercise - Follow up with endocrinologist if needed   Class 3 severe obesity due to excess calories with serious comorbidity and body mass index (BMI) of 40.0 to 44.9 in adult [facility]) Assessment & Plan: Class 3 severe obesity due to excess calories and decreased physical activity, with serious comorbidities of uncontrolled diabetes type 2.  Started on [facility] for diabetes management, which should help with her weight. Advised to work on increasing physical activity Work on mindful eating Will follow up   Acute non-recurrent maxillary sinusitis Assessment & Plan: Started on Augmentin in the hospital for right-sided earache and pressure in the right maxillary sinus area. Continues to take antibiotics. - Complete the course of Augmentin as prescribed - Monitor for any signs of persistent or worsening symptoms   Other orders -     Tirzepatide; Inject 2.5 mg into the skin once a week.  Dispense: 2 mL; Refill: 2     Meds ordered this encounter  Medications   tirzepatide (MOUNJARO) 2.5 MG/0.5ML Pen    Sig: Inject 2.5 mg into the skin once a week.  Dispense:  2 mL    Refill:  2    No orders of the defined types were placed in this encounter.    Follow-up: No follow-ups on file.   I,Marla I Leal-Borjas,acting as a scribe for [employer], MD.,have documented all relevant documentation on the behalf of Windell Moment, MD,as directed by  Windell Moment, MD while in the presence of Windell Moment, MD.   An After Visit Summary was printed and given to the patient.  Windell Moment, MD [facility] [phone]

## 2023-01-25 NOTE — Assessment & Plan Note (Signed)
Started on Augmentin in the hospital for right-sided earache and pressure in the right maxillary sinus area. Continues to take antibiotics. - Complete the course of Augmentin as prescribed - Monitor for any signs of persistent or worsening symptoms

## 2023-01-25 NOTE — Assessment & Plan Note (Signed)
Uncontrolled diabetes with recent A1c of 14.1. Noncompliance with metformin due to GI side effects. Currently on insulin therapy (Lantus 35 units AM, Humulin R before meals and at bedtime). Blood glucose levels remain elevated. Discussed potential benefits of GLP-1 medications like Mounjaro for weight loss and better glycemic control. Risks include nausea, vomiting, constipation, and diarrhea. Insurance coverage for GLP-1 medications is uncertain. - Prescribe Mounjaro at a low dose and monitor for side effects - Continue current insulin regimen - Monitor blood glucose levels regularly - Encourage dietary modifications and regular exercise - Follow up with endocrinologist if needed

## 2023-01-25 NOTE — Assessment & Plan Note (Signed)
Class 3 severe obesity due to excess calories and decreased physical activity, with serious comorbidities of uncontrolled diabetes type 2.  Started on [facility] for diabetes management, which should help with her weight. Advised to work on increasing physical activity Work on mindful eating Will follow up

## 2023-01-25 NOTE — Assessment & Plan Note (Signed)
Obstructive sleep apnea managed with CPAP. Prefers [facility] for CPAP supplies. - Continue using CPAP machine - Coordinate with [facility] for CPAP supplies

## 2023-01-25 NOTE — Assessment & Plan Note (Signed)
Well controlled on Valsartan 320 mg-hydrochlorothiazide 25 mg

## 2023-01-25 NOTE — Assessment & Plan Note (Signed)
Resolved rhabdomyolysis due to prolonged immobility after a fall. Treated with hydration in the hospital. No acute intracranial abnormality on CT scan. - Continue physical therapy to prevent future falls - Monitor for any signs of muscle pain or weakness

## 2023-01-26 ENCOUNTER — Telehealth: Payer: Self-pay

## 2023-01-26 DIAGNOSIS — J019 Acute sinusitis, unspecified: Secondary | ICD-10-CM | POA: Diagnosis not present

## 2023-01-26 DIAGNOSIS — B9689 Other specified bacterial agents as the cause of diseases classified elsewhere: Secondary | ICD-10-CM | POA: Diagnosis not present

## 2023-01-26 DIAGNOSIS — E876 Hypokalemia: Secondary | ICD-10-CM | POA: Diagnosis not present

## 2023-01-26 DIAGNOSIS — E039 Hypothyroidism, unspecified: Secondary | ICD-10-CM | POA: Diagnosis not present

## 2023-01-26 DIAGNOSIS — G4733 Obstructive sleep apnea (adult) (pediatric): Secondary | ICD-10-CM | POA: Diagnosis not present

## 2023-01-26 DIAGNOSIS — I21A1 Myocardial infarction type 2: Secondary | ICD-10-CM | POA: Diagnosis not present

## 2023-01-26 DIAGNOSIS — E872 Acidosis, unspecified: Secondary | ICD-10-CM | POA: Diagnosis not present

## 2023-01-26 DIAGNOSIS — E1165 Type 2 diabetes mellitus with hyperglycemia: Secondary | ICD-10-CM | POA: Diagnosis not present

## 2023-01-26 DIAGNOSIS — Z794 Long term (current) use of insulin: Secondary | ICD-10-CM | POA: Diagnosis not present

## 2023-01-26 DIAGNOSIS — I10 Essential (primary) hypertension: Secondary | ICD-10-CM | POA: Diagnosis not present

## 2023-01-26 DIAGNOSIS — Z9181 History of falling: Secondary | ICD-10-CM | POA: Diagnosis not present

## 2023-01-26 DIAGNOSIS — M47812 Spondylosis without myelopathy or radiculopathy, cervical region: Secondary | ICD-10-CM | POA: Diagnosis not present

## 2023-01-26 DIAGNOSIS — M1610 Unilateral primary osteoarthritis, unspecified hip: Secondary | ICD-10-CM | POA: Diagnosis not present

## 2023-01-26 DIAGNOSIS — Z792 Long term (current) use of antibiotics: Secondary | ICD-10-CM | POA: Diagnosis not present

## 2023-01-26 DIAGNOSIS — J9811 Atelectasis: Secondary | ICD-10-CM | POA: Diagnosis not present

## 2023-01-26 DIAGNOSIS — M1712 Unilateral primary osteoarthritis, left knee: Secondary | ICD-10-CM | POA: Diagnosis not present

## 2023-01-26 DIAGNOSIS — G4700 Insomnia, unspecified: Secondary | ICD-10-CM | POA: Diagnosis not present

## 2023-01-26 NOTE — Telephone Encounter (Signed)
Reason for CRM: Joni Reining from [facility] called. She is home health nurse for pt and said patient is having urinary symptoms and she would like to have an order for UAC&S.  Her callback number is [phone]  or you may contact pt.  Please advise if it's okay to give verbal order.

## 2023-01-26 NOTE — Telephone Encounter (Signed)
Reason for CRM:  Patient states she recently had a severe UTI and states she feels it has come back.  She states she is dropping off a urine sample to the clinic in the morning but    requests a call back to discuss, [phone].   Called patient and she stated that she was going to send her urine by her husband in the morning, let patient know that is fine to just make sure urine is in something clean and it will be great if she can get a clean catch in the morning.

## 2023-01-26 NOTE — Telephone Encounter (Signed)
Called PT home health back, verbal was given for one visit for this week, 2x a week for 3 weeks, 1x a week for 4 weeks

## 2023-01-26 NOTE — Telephone Encounter (Signed)
Patient husband is dropping off urine sample tomorrow at office. Stated she was seen yesterday and urine was not check and was told she can drop off sample tomorrow. Made her aware we will call with results

## 2023-01-27 ENCOUNTER — Ambulatory Visit: Payer: Medicare Other

## 2023-01-27 ENCOUNTER — Other Ambulatory Visit: Payer: Self-pay | Admitting: Family Medicine

## 2023-01-27 DIAGNOSIS — R350 Frequency of micturition: Secondary | ICD-10-CM | POA: Diagnosis not present

## 2023-01-27 DIAGNOSIS — N3001 Acute cystitis with hematuria: Secondary | ICD-10-CM | POA: Diagnosis not present

## 2023-01-27 LAB — POCT URINALYSIS DIP (CLINITEK)
Bilirubin, UA: NEGATIVE
Glucose, UA: 250 mg/dL — AB
Ketones, POC UA: NEGATIVE mg/dL
Nitrite, UA: NEGATIVE
POC PROTEIN,UA: NEGATIVE
Spec Grav, UA: 1.02 (ref 1.010–1.025)
Urobilinogen, UA: NEGATIVE U/dL — AB
pH, UA: 6 (ref 5.0–8.0)

## 2023-01-27 MED ORDER — CIPROFLOXACIN HCL 250 MG PO TABS: 250.0000 mg | ORAL_TABLET | Freq: Two times a day (BID) | ORAL | 0 refills | Status: AC

## 2023-01-27 NOTE — Progress Notes (Signed)
Patient dropped off urine to be checked for urinalysis and patient was having urinary frequency. The urine showed positive Leukocytes and blood. Consulted with Dr. Sedalia Muta and per provider send for culture and patient is to start Cipro 250 mg 1 tablet BID x 3 days. Called and informed patient and told her we will call her once we receive her urine culture results.

## 2023-01-29 DIAGNOSIS — J019 Acute sinusitis, unspecified: Secondary | ICD-10-CM | POA: Diagnosis not present

## 2023-01-29 DIAGNOSIS — E1165 Type 2 diabetes mellitus with hyperglycemia: Secondary | ICD-10-CM | POA: Diagnosis not present

## 2023-01-29 DIAGNOSIS — J9811 Atelectasis: Secondary | ICD-10-CM | POA: Diagnosis not present

## 2023-01-29 DIAGNOSIS — Z794 Long term (current) use of insulin: Secondary | ICD-10-CM | POA: Diagnosis not present

## 2023-01-29 DIAGNOSIS — E876 Hypokalemia: Secondary | ICD-10-CM | POA: Diagnosis not present

## 2023-01-29 DIAGNOSIS — Z9181 History of falling: Secondary | ICD-10-CM | POA: Diagnosis not present

## 2023-01-29 DIAGNOSIS — I21A1 Myocardial infarction type 2: Secondary | ICD-10-CM | POA: Diagnosis not present

## 2023-01-29 DIAGNOSIS — E872 Acidosis, unspecified: Secondary | ICD-10-CM | POA: Diagnosis not present

## 2023-01-29 DIAGNOSIS — G4733 Obstructive sleep apnea (adult) (pediatric): Secondary | ICD-10-CM | POA: Diagnosis not present

## 2023-01-29 DIAGNOSIS — M1610 Unilateral primary osteoarthritis, unspecified hip: Secondary | ICD-10-CM | POA: Diagnosis not present

## 2023-01-29 DIAGNOSIS — E039 Hypothyroidism, unspecified: Secondary | ICD-10-CM | POA: Diagnosis not present

## 2023-01-29 DIAGNOSIS — B9689 Other specified bacterial agents as the cause of diseases classified elsewhere: Secondary | ICD-10-CM | POA: Diagnosis not present

## 2023-01-29 DIAGNOSIS — M1712 Unilateral primary osteoarthritis, left knee: Secondary | ICD-10-CM | POA: Diagnosis not present

## 2023-01-29 DIAGNOSIS — G4700 Insomnia, unspecified: Secondary | ICD-10-CM | POA: Diagnosis not present

## 2023-01-29 DIAGNOSIS — Z792 Long term (current) use of antibiotics: Secondary | ICD-10-CM | POA: Diagnosis not present

## 2023-01-29 DIAGNOSIS — M47812 Spondylosis without myelopathy or radiculopathy, cervical region: Secondary | ICD-10-CM | POA: Diagnosis not present

## 2023-01-29 DIAGNOSIS — I10 Essential (primary) hypertension: Secondary | ICD-10-CM | POA: Diagnosis not present

## 2023-02-01 ENCOUNTER — Telehealth: Payer: Self-pay

## 2023-02-01 ENCOUNTER — Encounter: Payer: Self-pay | Admitting: Family Medicine

## 2023-02-01 ENCOUNTER — Other Ambulatory Visit: Payer: Self-pay | Admitting: Family Medicine

## 2023-02-01 ENCOUNTER — Ambulatory Visit: Payer: Medicare Other | Admitting: Family Medicine

## 2023-02-01 DIAGNOSIS — E039 Hypothyroidism, unspecified: Secondary | ICD-10-CM | POA: Diagnosis not present

## 2023-02-01 DIAGNOSIS — E1165 Type 2 diabetes mellitus with hyperglycemia: Secondary | ICD-10-CM | POA: Diagnosis not present

## 2023-02-01 DIAGNOSIS — J9811 Atelectasis: Secondary | ICD-10-CM | POA: Diagnosis not present

## 2023-02-01 DIAGNOSIS — M1610 Unilateral primary osteoarthritis, unspecified hip: Secondary | ICD-10-CM | POA: Diagnosis not present

## 2023-02-01 DIAGNOSIS — I21A1 Myocardial infarction type 2: Secondary | ICD-10-CM | POA: Diagnosis not present

## 2023-02-01 DIAGNOSIS — M47812 Spondylosis without myelopathy or radiculopathy, cervical region: Secondary | ICD-10-CM | POA: Diagnosis not present

## 2023-02-01 DIAGNOSIS — Z9181 History of falling: Secondary | ICD-10-CM | POA: Diagnosis not present

## 2023-02-01 DIAGNOSIS — E876 Hypokalemia: Secondary | ICD-10-CM | POA: Diagnosis not present

## 2023-02-01 DIAGNOSIS — G4700 Insomnia, unspecified: Secondary | ICD-10-CM | POA: Diagnosis not present

## 2023-02-01 DIAGNOSIS — E872 Acidosis, unspecified: Secondary | ICD-10-CM | POA: Diagnosis not present

## 2023-02-01 DIAGNOSIS — I10 Essential (primary) hypertension: Secondary | ICD-10-CM | POA: Diagnosis not present

## 2023-02-01 DIAGNOSIS — B9689 Other specified bacterial agents as the cause of diseases classified elsewhere: Secondary | ICD-10-CM | POA: Diagnosis not present

## 2023-02-01 DIAGNOSIS — M1712 Unilateral primary osteoarthritis, left knee: Secondary | ICD-10-CM | POA: Diagnosis not present

## 2023-02-01 DIAGNOSIS — G4733 Obstructive sleep apnea (adult) (pediatric): Secondary | ICD-10-CM | POA: Diagnosis not present

## 2023-02-01 DIAGNOSIS — J019 Acute sinusitis, unspecified: Secondary | ICD-10-CM | POA: Diagnosis not present

## 2023-02-01 DIAGNOSIS — Z792 Long term (current) use of antibiotics: Secondary | ICD-10-CM | POA: Diagnosis not present

## 2023-02-01 DIAGNOSIS — Z794 Long term (current) use of insulin: Secondary | ICD-10-CM | POA: Diagnosis not present

## 2023-02-01 LAB — URINE CULTURE

## 2023-02-01 MED ORDER — FLUCONAZOLE 100 MG PO TABS: 100.0000 mg | ORAL_TABLET | Freq: Every day | ORAL | 0 refills | Status: DC

## 2023-02-01 MED ORDER — FLUCONAZOLE 150 MG PO TABS: 150.0000 mg | ORAL_TABLET | Freq: Once | ORAL | 0 refills | Status: DC

## 2023-02-01 NOTE — Telephone Encounter (Signed)
Joni Reining a nurse with [facility] left a message on 01/29/2023 at 11:55 AM stating that this pt is currently being treated for a UTI and now has developed some itching, the pt thinks she may be getting a yeast infection. Joni Reining was asking if fluconazole could possibly be sent in The pt told Joni Reining that she may have her husband pick something up OTC for a yeast infection. Nicole's call back number: [phone]

## 2023-02-03 ENCOUNTER — Encounter: Payer: Self-pay | Admitting: Family Medicine

## 2023-02-04 ENCOUNTER — Telehealth: Payer: Self-pay

## 2023-02-04 DIAGNOSIS — G4700 Insomnia, unspecified: Secondary | ICD-10-CM | POA: Diagnosis not present

## 2023-02-04 DIAGNOSIS — M1610 Unilateral primary osteoarthritis, unspecified hip: Secondary | ICD-10-CM | POA: Diagnosis not present

## 2023-02-04 DIAGNOSIS — G4733 Obstructive sleep apnea (adult) (pediatric): Secondary | ICD-10-CM | POA: Diagnosis not present

## 2023-02-04 DIAGNOSIS — E039 Hypothyroidism, unspecified: Secondary | ICD-10-CM | POA: Diagnosis not present

## 2023-02-04 DIAGNOSIS — E876 Hypokalemia: Secondary | ICD-10-CM | POA: Diagnosis not present

## 2023-02-04 DIAGNOSIS — Z794 Long term (current) use of insulin: Secondary | ICD-10-CM | POA: Diagnosis not present

## 2023-02-04 DIAGNOSIS — J9811 Atelectasis: Secondary | ICD-10-CM | POA: Diagnosis not present

## 2023-02-04 DIAGNOSIS — I10 Essential (primary) hypertension: Secondary | ICD-10-CM | POA: Diagnosis not present

## 2023-02-04 DIAGNOSIS — E1165 Type 2 diabetes mellitus with hyperglycemia: Secondary | ICD-10-CM | POA: Diagnosis not present

## 2023-02-04 DIAGNOSIS — Z792 Long term (current) use of antibiotics: Secondary | ICD-10-CM | POA: Diagnosis not present

## 2023-02-04 DIAGNOSIS — I21A1 Myocardial infarction type 2: Secondary | ICD-10-CM | POA: Diagnosis not present

## 2023-02-04 DIAGNOSIS — J019 Acute sinusitis, unspecified: Secondary | ICD-10-CM | POA: Diagnosis not present

## 2023-02-04 DIAGNOSIS — Z9181 History of falling: Secondary | ICD-10-CM | POA: Diagnosis not present

## 2023-02-04 DIAGNOSIS — M1712 Unilateral primary osteoarthritis, left knee: Secondary | ICD-10-CM | POA: Diagnosis not present

## 2023-02-04 DIAGNOSIS — B9689 Other specified bacterial agents as the cause of diseases classified elsewhere: Secondary | ICD-10-CM | POA: Diagnosis not present

## 2023-02-04 DIAGNOSIS — M47812 Spondylosis without myelopathy or radiculopathy, cervical region: Secondary | ICD-10-CM | POA: Diagnosis not present

## 2023-02-04 DIAGNOSIS — E872 Acidosis, unspecified: Secondary | ICD-10-CM | POA: Diagnosis not present

## 2023-02-04 NOTE — Telephone Encounter (Signed)
Verbal was given for social work eval every 30 day, to help patient with getting life alert device and libre  Copied from [employer] [phone]. Topic: Clinical - Home Health Verbal Orders >> Feb 04, 2023 12:16 PM Clayton Bibles wrote: Caller/Agency: Harvie Junior Number: [phone] Service Requested: Social Worker Eval  Frequency: 1 time for 30 days Any new concerns about the patient? Life alert device

## 2023-02-05 DIAGNOSIS — J9811 Atelectasis: Secondary | ICD-10-CM | POA: Diagnosis not present

## 2023-02-05 DIAGNOSIS — M1712 Unilateral primary osteoarthritis, left knee: Secondary | ICD-10-CM | POA: Diagnosis not present

## 2023-02-05 DIAGNOSIS — Z9181 History of falling: Secondary | ICD-10-CM | POA: Diagnosis not present

## 2023-02-05 DIAGNOSIS — G4700 Insomnia, unspecified: Secondary | ICD-10-CM | POA: Diagnosis not present

## 2023-02-05 DIAGNOSIS — E039 Hypothyroidism, unspecified: Secondary | ICD-10-CM | POA: Diagnosis not present

## 2023-02-05 DIAGNOSIS — I10 Essential (primary) hypertension: Secondary | ICD-10-CM | POA: Diagnosis not present

## 2023-02-05 DIAGNOSIS — Z792 Long term (current) use of antibiotics: Secondary | ICD-10-CM | POA: Diagnosis not present

## 2023-02-05 DIAGNOSIS — M47812 Spondylosis without myelopathy or radiculopathy, cervical region: Secondary | ICD-10-CM | POA: Diagnosis not present

## 2023-02-05 DIAGNOSIS — G4733 Obstructive sleep apnea (adult) (pediatric): Secondary | ICD-10-CM | POA: Diagnosis not present

## 2023-02-05 DIAGNOSIS — E1165 Type 2 diabetes mellitus with hyperglycemia: Secondary | ICD-10-CM | POA: Diagnosis not present

## 2023-02-05 DIAGNOSIS — M1610 Unilateral primary osteoarthritis, unspecified hip: Secondary | ICD-10-CM | POA: Diagnosis not present

## 2023-02-05 DIAGNOSIS — Z794 Long term (current) use of insulin: Secondary | ICD-10-CM | POA: Diagnosis not present

## 2023-02-05 DIAGNOSIS — E872 Acidosis, unspecified: Secondary | ICD-10-CM | POA: Diagnosis not present

## 2023-02-05 DIAGNOSIS — B9689 Other specified bacterial agents as the cause of diseases classified elsewhere: Secondary | ICD-10-CM | POA: Diagnosis not present

## 2023-02-05 DIAGNOSIS — J019 Acute sinusitis, unspecified: Secondary | ICD-10-CM | POA: Diagnosis not present

## 2023-02-05 DIAGNOSIS — E876 Hypokalemia: Secondary | ICD-10-CM | POA: Diagnosis not present

## 2023-02-05 DIAGNOSIS — I21A1 Myocardial infarction type 2: Secondary | ICD-10-CM | POA: Diagnosis not present

## 2023-02-08 ENCOUNTER — Ambulatory Visit (INDEPENDENT_AMBULATORY_CARE_PROVIDER_SITE_OTHER): Payer: Medicare Other

## 2023-02-08 ENCOUNTER — Telehealth: Payer: Self-pay | Admitting: Family Medicine

## 2023-02-08 ENCOUNTER — Other Ambulatory Visit: Payer: Self-pay

## 2023-02-08 VITALS — Ht 66.0 in | Wt 289.0 lb

## 2023-02-08 DIAGNOSIS — E1165 Type 2 diabetes mellitus with hyperglycemia: Secondary | ICD-10-CM | POA: Diagnosis not present

## 2023-02-08 DIAGNOSIS — B9689 Other specified bacterial agents as the cause of diseases classified elsewhere: Secondary | ICD-10-CM | POA: Diagnosis not present

## 2023-02-08 DIAGNOSIS — E039 Hypothyroidism, unspecified: Secondary | ICD-10-CM | POA: Diagnosis not present

## 2023-02-08 DIAGNOSIS — M47812 Spondylosis without myelopathy or radiculopathy, cervical region: Secondary | ICD-10-CM | POA: Diagnosis not present

## 2023-02-08 DIAGNOSIS — E872 Acidosis, unspecified: Secondary | ICD-10-CM | POA: Diagnosis not present

## 2023-02-08 DIAGNOSIS — J019 Acute sinusitis, unspecified: Secondary | ICD-10-CM | POA: Diagnosis not present

## 2023-02-08 DIAGNOSIS — Z0000 Encounter for general adult medical examination without abnormal findings: Secondary | ICD-10-CM | POA: Diagnosis not present

## 2023-02-08 DIAGNOSIS — G4700 Insomnia, unspecified: Secondary | ICD-10-CM | POA: Diagnosis not present

## 2023-02-08 DIAGNOSIS — E876 Hypokalemia: Secondary | ICD-10-CM | POA: Diagnosis not present

## 2023-02-08 DIAGNOSIS — Z1231 Encounter for screening mammogram for malignant neoplasm of breast: Secondary | ICD-10-CM

## 2023-02-08 DIAGNOSIS — I10 Essential (primary) hypertension: Secondary | ICD-10-CM | POA: Diagnosis not present

## 2023-02-08 DIAGNOSIS — J9811 Atelectasis: Secondary | ICD-10-CM | POA: Diagnosis not present

## 2023-02-08 DIAGNOSIS — Z9181 History of falling: Secondary | ICD-10-CM | POA: Diagnosis not present

## 2023-02-08 DIAGNOSIS — I21A1 Myocardial infarction type 2: Secondary | ICD-10-CM | POA: Diagnosis not present

## 2023-02-08 DIAGNOSIS — M1610 Unilateral primary osteoarthritis, unspecified hip: Secondary | ICD-10-CM | POA: Diagnosis not present

## 2023-02-08 DIAGNOSIS — M1712 Unilateral primary osteoarthritis, left knee: Secondary | ICD-10-CM | POA: Diagnosis not present

## 2023-02-08 DIAGNOSIS — Z794 Long term (current) use of insulin: Secondary | ICD-10-CM | POA: Diagnosis not present

## 2023-02-08 DIAGNOSIS — G4733 Obstructive sleep apnea (adult) (pediatric): Secondary | ICD-10-CM | POA: Diagnosis not present

## 2023-02-08 DIAGNOSIS — Z792 Long term (current) use of antibiotics: Secondary | ICD-10-CM | POA: Diagnosis not present

## 2023-02-08 NOTE — Progress Notes (Signed)
Because this visit was a virtual/telehealth visit,  certain criteria was not obtained, such a blood pressure, CBG if applicable, and timed get up and go. Any medications not marked as "taking" were not mentioned during the medication reconciliation part of the visit. Any vitals not documented were not able to be obtained due to this being a telehealth visit or patient was unable to self-report a recent blood pressure reading due to a lack of equipment at home via telehealth. Vitals that have been documented are verbally provided by the patient.   Subjective:   Whitney Eaton is a 72 y.o. female who presents for Medicare Annual (Subsequent) preventive examination.  Visit Complete: Virtual I connected with  Elfida Hobson on 02/08/23 by a audio enabled telemedicine application and verified that I am speaking with the correct person using two identifiers.  Patient Location: Home  Provider Location: Home Office  I discussed the limitations of evaluation and management by telemedicine. The patient expressed understanding and agreed to proceed.  Vital Signs: Because this visit was a virtual/telehealth visit, some criteria may be missing or patient reported. Any vitals not documented were not able to be obtained and vitals that have been documented are patient reported.  Patient Medicare AWV questionnaire was completed by the patient on 02/03/2023; I have confirmed that all information answered by patient is correct and no changes since this date.  Cardiac Risk Factors include: advanced age (>68men, >88 women);diabetes mellitus;dyslipidemia;hypertension;sedentary lifestyle;obesity (BMI >30kg/m2)     Objective:    Today's Vitals   02/08/23 1431  Weight: 289 lb (131.1 kg)  Height: 5\' 6"  (1.676 m)   Body mass index is 46.65 kg/m.     02/08/2023    2:35 PM 06/04/2020   10:36 AM  Advanced Directives  Does Patient Have a Medical Advance Directive? Yes Yes  Type of Estate agent  of [place];Living will Healthcare Power of [place];Living will  Does patient want to make changes to medical advance directive? No - Patient declined No - Patient declined  Copy of Healthcare Power of Attorney in Chart? Yes - validated most recent copy scanned in chart (See row information) No - copy requested    Current Medications (verified) Outpatient Encounter Medications as of 02/08/2023  Medication Sig   ACCU-CHEK GUIDE test strip USE TO CHECK FASTING BLOOD SUGAR AS DIRECTED   Accu-Chek Softclix Lancets lancets CHECK FASTING BLOOD SUGAR ONCE DAILY   blood glucose meter kit and supplies KIT Check sugars fasting. E11.69   fluconazole (DIFLUCAN) 100 MG tablet Take 1 tablet (100 mg total) by mouth daily.   LANTUS SOLOSTAR 100 UNIT/ML Solostar Pen Inject 35 Units into the skin daily.   levothyroxine (SYNTHROID) 150 MCG tablet TAKE 1/2 TABLET(75 MCG) BY MOUTH DAILY   potassium chloride (KLOR-CON M) 10 MEQ tablet Take 1 tablet (10 mEq total) by mouth 2 (two) times daily.   tirzepatide [facility]) 2.5 MG/0.5ML Pen Inject 2.5 mg into the skin once a week.   valsartan-hydrochlorothiazide (DIOVAN-HCT) 320-25 MG tablet Take 1 tablet by mouth daily.   Vitamin D, Ergocalciferol, (DRISDOL) 1.25 MG (50000 UNIT) CAPS capsule Take 1 capsule (50,000 Units total) by mouth every 7 (seven) days.   zolpidem (AMBIEN) 10 MG tablet TAKE 1 TABLET(10 MG) BY MOUTH AT BEDTIME   amoxicillin-clavulanate (AUGMENTIN) 875-125 MG tablet Take 1 tablet by mouth 2 (two) times daily.   celecoxib (CELEBREX) 200 MG capsule Take 1 capsule (200 mg total) by mouth daily.   No facility-administered encounter medications on  file as of 02/08/2023.    Allergies (verified) Metformin and related, Crestor [rosuvastatin], Fenofibrate, Jardiance [empagliflozin], and Lipitor [atorvastatin]   History: Past Medical History:  Diagnosis Date   Asthma    Diabetes (HCC)    Hyperlipemia    Hypertension    Psoriasis    Sleep apnea     Thyroid disease    Past Surgical History:  Procedure Laterality Date   ABDOMINAL HYSTERECTOMY     CHOLECYSTECTOMY  2011   HYSTERECTOMY ABDOMINAL WITH SALPINGECTOMY     OOPHORECTOMY  1980   Family History  Problem Relation Age of Onset   Alzheimer's disease Mother    Breast cancer Mother    Arthritis Mother    Diabetes Mother    Eczema Father    CAD Father    Diabetes Father    Parkinson's disease Father    Breast cancer Sister    Neurologic Disorder Brother    Glaucoma Maternal Grandmother    Heart disease Other    Social History   Socioeconomic History   Marital status: Married    Spouse name: Not on file   Number of children: 2   Years of education: Not on file   Highest education level: Not on file  Occupational History   Occupation: homemaker  Tobacco Use   Smoking status: Never   Smokeless tobacco: Never  Vaping Use   Vaping status: Never Used  Substance and Sexual Activity   Alcohol use: Never   Drug use: Never   Sexual activity: Not on file  Other Topics Concern   Not on file  Social History Narrative   Not on file   Social Determinants of Health   Financial Resource Strain: Low Risk  (02/03/2023)   Overall Financial Resource Strain (CARDIA)    Difficulty of Paying Living Expenses: Not hard at all  Food Insecurity: No Food Insecurity (02/03/2023)   Hunger Vital Sign    Worried About Running Out of Food in the Last Year: Never true    Ran Out of Food in the Last Year: Never true  Transportation Needs: No Transportation Needs (02/03/2023)   PRAPARE - Administrator, Civil Service (Medical): No    Lack of Transportation (Non-Medical): No  Physical Activity: Inactive (02/03/2023)   Exercise Vital Sign    Days of Exercise per Week: 0 days    Minutes of Exercise per Session: 0 min  Stress: No Stress Concern Present (02/03/2023)   [employer] of Occupational Health - Occupational Stress Questionnaire    Feeling of Stress : Only a little   Social Connections: Socially Integrated (02/03/2023)   Social Connection and Isolation Panel [NHANES]    Frequency of Communication with Friends and Family: Three times a week    Frequency of Social Gatherings with Friends and Family: Once a week    Attends Religious Services: 1 to 4 times per year    Active Member of [employer] or Organizations: Yes    Attends Banker Meetings: 1 to 4 times per year    Marital Status: Married    Tobacco Counseling Counseling given: Yes   Clinical Intake:  Pre-visit preparation completed: Yes  Pain : No/denies pain     BMI - recorded: 46.65 Nutritional Status: BMI > 30  Obese Nutritional Risks: None Diabetes: Yes CBG done?: No (telehealth visit.) Did pt. bring in CBG monitor from home?: No  How often do you need to have someone help you when you read instructions, pamphlets,  or other written materials from your doctor or pharmacy?: 1 - Never  Interpreter Needed?: No  Information entered by :: Abby Tien Spooner, CMA   Activities of Daily Living    02/03/2023    5:24 PM 02/19/2022    8:28 AM  In your present state of health, do you have any difficulty performing the following activities:  Hearing? 0 0  Vision? 0 0  Difficulty concentrating or making decisions? 0 0  Walking or climbing stairs? 1 1  Dressing or bathing? 0 0  Doing errands, shopping? 1 0  Preparing Food and eating ? N N  Using the Toilet? N N  In the past six months, have you accidently leaked urine? Y N  Do you have problems with loss of bowel control? N N  Managing your Medications? N N  Managing your Finances? N N  Housekeeping or managing your Housekeeping? N N    Patient Care Team: CoxFritzi Mandes, MD as PCP - General (Family Medicine) Denamur, Darin, DC as Referring Physician (Chiropractic Medicine) Jonell Cluck, PA-C (Dermatology) Yvette Rack, OD (Optometry) Prescilla Sours, FNP as Nurse Practitioner (Urology)  Indicate any recent Medical Services  you may have received from other than [facility] providers in the past year (date may be approximate).     Assessment:   This is a routine wellness examination for Kerah.  Hearing/Vision screen Hearing Screening - Comments:: Patient denies any hearing difficulties.   Vision Screening - Comments:: Wears rx glasses - up to date with routine eye exams  [facility]   Goals Addressed             This Visit's Progress    Patient Stated       Become more mobile       Depression Screen    02/08/2023    2:43 PM 02/19/2022    8:26 AM 04/17/2021    3:20 PM 11/01/2020   11:25 AM 10/28/2020    9:53 AM 09/26/2020   11:42 AM 06/04/2020   10:37 AM  PHQ 2/9 Scores  PHQ - 2 Score 0 2 6 0 0 0 0  PHQ- 9 Score 0 9 15        Fall Risk    02/03/2023    5:24 PM 02/19/2022    8:25 AM 04/17/2021    3:19 PM 11/01/2020   11:25 AM 10/28/2020    9:53 AM  Fall Risk   Falls in the past year? 1 1 1 1 1   Number falls in past yr: 1 1 1  0 0  Injury with Fall? 1 0 1 0 0  Risk for fall due to : History of fall(s);Impaired balance/gait;Orthopedic patient;Impaired mobility History of fall(s)  No Fall Risks   Follow up Education provided;Falls prevention discussed Falls evaluation completed;Education provided Falls evaluation completed Falls evaluation completed     MEDICARE RISK AT HOME: Medicare Risk at Home Any stairs in or around the home?: Yes If so, are there any without handrails?: No Home free of loose throw rugs in walkways, pet beds, electrical cords, etc?: Yes Adequate lighting in your home to reduce risk of falls?: Yes Life alert?: No Use of a cane, walker or w/c?: Yes Grab bars in the bathroom?: Yes Shower chair or bench in shower?: Yes Elevated toilet seat or a handicapped toilet?: Yes  TIMED UP AND GO:  Was the test performed?  No    Cognitive Function:        02/08/2023    2:45  PM 02/19/2022    8:30 AM 06/04/2020    1:23 PM  6CIT Screen  What Year? 0 points 0 points 0 points   What month? 0 points 0 points 0 points  What time? 0 points 0 points 0 points  Count back from 20 0 points 0 points 0 points  Months in reverse 0 points 0 points 0 points  Repeat phrase 0 points 0 points 0 points  Total Score 0 points 0 points 0 points    Immunizations Immunization History  Administered Date(s) Administered   Fluad Quad(high Dose 65+) 01/17/2020, 11/01/2020, 02/17/2022   Influenza,inj,quad, With Preservative 12/19/2013, 10/29/2014, 10/31/2015   Influenza-Unspecified 11/27/2011, 01/02/2013, 01/15/2017, 12/16/2017, 01/18/2019   Moderna Sars-Covid-2 Vaccination 04/06/2019, 05/02/2019, 02/21/2020, 07/19/2020   Pneumococcal Conjugate-13 08/05/2016   Pneumococcal Polysaccharide-23 03/16/2012, 11/01/2020   Zoster Recombinant(Shingrix) 08/19/2020, 10/19/2020   Zoster, Live 01/21/2011    TDAP status: Due, Education has been provided regarding the importance of this vaccine. Advised may receive this vaccine at local pharmacy or Health Dept. Aware to provide a copy of the vaccination record if obtained from local pharmacy or Health Dept. Verbalized acceptance and understanding.  Flu Vaccine status: Up to date  Pneumococcal vaccine status: Up to date  Covid-19 vaccine status: Information provided on how to obtain vaccines.   Qualifies for Shingles Vaccine? Yes   Zostavax completed Yes   Shingrix Completed?: Yes  Screening Tests Health Maintenance  Topic Date Due   DTaP/Tdap/Td (1 - Tdap) Never done   OPHTHALMOLOGY EXAM  08/22/2020   MAMMOGRAM  05/27/2021   Colonoscopy  06/24/2021   HEMOGLOBIN A1C  08/19/2022   INFLUENZA VACCINE  10/01/2022   COVID-19 Vaccine (5 - 2023-24 season) 11/01/2022   Diabetic kidney evaluation - eGFR measurement  02/18/2023   Diabetic kidney evaluation - Urine ACR  02/18/2023   Medicare Annual Wellness (AWV)  02/19/2023   FOOT EXAM  02/18/2023   Pneumonia Vaccine 4+ Years old  Completed   DEXA SCAN  Completed   Hepatitis C Screening   Completed   Zoster Vaccines- Shingrix  Completed   HPV VACCINES  Aged Out    Health Maintenance  Health Maintenance Due  Topic Date Due   DTaP/Tdap/Td (1 - Tdap) Never done   OPHTHALMOLOGY EXAM  08/22/2020   MAMMOGRAM  05/27/2021   Colonoscopy  06/24/2021   HEMOGLOBIN A1C  08/19/2022   INFLUENZA VACCINE  10/01/2022   COVID-19 Vaccine (5 - 2023-24 season) 11/01/2022   Diabetic kidney evaluation - eGFR measurement  02/18/2023   Diabetic kidney evaluation - Urine ACR  02/18/2023   Medicare Annual Wellness (AWV)  02/19/2023    Colorectal Cancer Screening: Patient declined colorectal cancer screening   Mammogram Status: Patient is scheduled to have mammogram on  Wednesday February 10, 2023  Bone Density status: Completed 02/26/2017. Results reflect: Bone density results: NORMAL. Repeat every 5 years. Offered patient a referral for repeat scan. She declined. Wants to discuss it with provider first.   Lung Cancer Screening: (Low Dose CT Chest recommended if Age 39-80 years, 20 pack-year currently smoking OR have quit w/in 15years.) does not qualify.   Lung Cancer Screening Referral: na  Additional Screening:  Hepatitis C Screening: does not qualify; Completed   Vision Screening: Recommended annual ophthalmology exams for early detection of glaucoma and other disorders of the eye. Is the patient up to date with their annual eye exam?  Yes  Who is the provider or what is the name of the office in which  the patient attends annual eye exams? [employer] If pt is not established with a provider, would they like to be referred to a provider to establish care? No .   Dental Screening: Recommended annual dental exams for proper oral hygiene  Diabetic Foot Exam: Diabetic Foot Exam: Completed 02/17/2022  Community Resource Referral / Chronic Care Management: CRR required this visit?  No   CCM required this visit?  No     Plan:     I have personally reviewed and noted the  following in the patient's chart:   Medical and social history Use of alcohol, tobacco or illicit drugs  Current medications and supplements including opioid prescriptions. Patient is not currently taking opioid prescriptions. Functional ability and status Nutritional status Physical activity Advanced directives List of other physicians Hospitalizations, surgeries, and ER visits in previous 12 months Vitals Screenings to include cognitive, depression, and falls Referrals and appointments  In addition, I have reviewed and discussed with patient certain preventive protocols, quality metrics, and best practice recommendations. A written personalized care plan for preventive services as well as general preventive health recommendations were provided to patient.     Jordan Hawks Jachelle Fluty, CMA   02/08/2023   After Visit Summary: (MyChart) Due to this being a telephonic visit, the after visit summary with patients personalized plan was offered to patient via MyChart

## 2023-02-08 NOTE — Patient Instructions (Signed)
Ms. Flot , Thank you for taking time to come for your Medicare Wellness Visit. I appreciate your ongoing commitment to your health goals. Please review the following plan we discussed and let me know if I can assist you in the future.   Referrals/Orders/Follow-Ups/Clinician Recommendations:  Please call in September 2025 to schedule your yearly Medicare Wellness Visit. That schedule is not opened right now.      This is a list of the screening recommended for you and due dates:  Health Maintenance  Topic Date Due   DTaP/Tdap/Td vaccine (1 - Tdap) Never done   Eye exam for diabetics  08/22/2020   Mammogram  05/27/2021   Colon Cancer Screening  06/24/2021   Hemoglobin A1C  08/19/2022   COVID-19 Vaccine (5 - 2023-24 season) 11/01/2022   Yearly kidney function blood test for diabetes  02/18/2023   Yearly kidney health urinalysis for diabetes  02/18/2023   Complete foot exam   02/18/2023   Medicare Annual Wellness Visit  02/08/2024   Pneumonia Vaccine  Completed   Flu Shot  Completed   DEXA scan (bone density measurement)  Completed   Hepatitis C Screening  Completed   Zoster (Shingles) Vaccine  Completed   HPV Vaccine  Aged Out    Advanced directives: (In Chart) A copy of your advanced directives are scanned into your chart should your provider ever need it.  Next Medicare Annual Wellness Visit scheduled for next year: No  Preventive Care 64 Years and Older, Female Preventive care refers to lifestyle choices and visits with your health care provider that can promote health and wellness. Preventive care visits are also called wellness exams. What can I expect for my preventive care visit? Counseling Your health care provider may ask you questions about your: Medical history, including: Past medical problems. Family medical history. Pregnancy and menstrual history. History of falls. Current health, including: Memory and ability to understand (cognition). Emotional  well-being. Home life and relationship well-being. Sexual activity and sexual health. Lifestyle, including: Alcohol, nicotine or tobacco, and drug use. Access to firearms. Diet, exercise, and sleep habits. Work and work Astronomer. Sunscreen use. Safety issues such as seatbelt and bike helmet use. Physical exam Your health care provider will check your: Height and weight. These may be used to calculate your BMI (body mass index). BMI is a measurement that tells if you are at a healthy weight. Waist circumference. This measures the distance around your waistline. This measurement also tells if you are at a healthy weight and may help predict your risk of certain diseases, such as type 2 diabetes and high blood pressure. Heart rate and blood pressure. Body temperature. Skin for abnormal spots. What immunizations do I need?  Vaccines are usually given at various ages, according to a schedule. Your health care provider will recommend vaccines for you based on your age, medical history, and lifestyle or other factors, such as travel or where you work. What tests do I need? Screening Your health care provider may recommend screening tests for certain conditions. This may include: Lipid and cholesterol levels. Hepatitis C test. Hepatitis B test. HIV (human immunodeficiency virus) test. STI (sexually transmitted infection) testing, if you are at risk. Lung cancer screening. Colorectal cancer screening. Diabetes screening. This is done by checking your blood sugar (glucose) after you have not eaten for a while (fasting). Mammogram. Talk with your health care provider about how often you should have regular mammograms. BRCA-related cancer screening. This may be done if you have  a family history of breast, ovarian, tubal, or peritoneal cancers. Bone density scan. This is done to screen for osteoporosis. Talk with your health care provider about your test results, treatment options, and if  necessary, the need for more tests. Follow these instructions at home: Eating and drinking  Eat a diet that includes fresh fruits and vegetables, whole grains, lean protein, and low-fat dairy products. Limit your intake of foods with high amounts of sugar, saturated fats, and salt. Take vitamin and mineral supplements as recommended by your health care provider. Do not drink alcohol if your health care provider tells you not to drink. If you drink alcohol: Limit how much you have to 0-1 drink a day. Know how much alcohol is in your drink. In the [place] one drink equals one 12 oz bottle of beer (355 mL), one 5 oz glass of wine (148 mL), or one 1 oz glass of hard liquor (44 mL). Lifestyle Brush your teeth every morning and night with fluoride toothpaste. Floss one time each day. Exercise for at least 30 minutes 5 or more days each week. Do not use any products that contain nicotine or tobacco. These products include cigarettes, chewing tobacco, and vaping devices, such as e-cigarettes. If you need help quitting, ask your health care provider. Do not use drugs. If you are sexually active, practice safe sex. Use a condom or other form of protection in order to prevent STIs. Take aspirin only as told by your health care provider. Make sure that you understand how much to take and what form to take. Work with your health care provider to find out whether it is safe and beneficial for you to take aspirin daily. Ask your health care provider if you need to take a cholesterol-lowering medicine (statin). Find healthy ways to manage stress, such as: Meditation, yoga, or listening to music. Journaling. Talking to a trusted person. Spending time with friends and family. Minimize exposure to UV radiation to reduce your risk of skin cancer. Safety Always wear your seat belt while driving or riding in a vehicle. Do not drive: If you have been drinking alcohol. Do not ride with someone who has been  drinking. When you are tired or distracted. While texting. If you have been using any mind-altering substances or drugs. Wear a helmet and other protective equipment during sports activities. If you have firearms in your house, make sure you follow all gun safety procedures. What's next? Visit your health care provider once a year for an annual wellness visit. Ask your health care provider how often you should have your eyes and teeth checked. Stay up to date on all vaccines. This information is not intended to replace advice given to you by your health care provider. Make sure you discuss any questions you have with your health care provider. Document Revised: 08/14/2020 Document Reviewed: 08/14/2020 Elsevier Patient Education  2024 [employer]. Understanding Your Risk for Falls Millions of people have serious injuries from falls each year. It is important to understand your risk of falling. Talk with your health care provider about your risk and what you can do to lower it. If you do have a serious fall, make sure to tell your provider. Falling once raises your risk of falling again. How can falls affect me? Serious injuries from falls are common. These include: Broken bones, such as hip fractures. Head injuries, such as traumatic brain injuries (TBI) or concussions. A fear of falling can cause you to avoid activities and stay  at home. This can make your muscles weaker and raise your risk for a fall. What can increase my risk? There are a number of risk factors that increase your risk for falling. The more risk factors you have, the higher your risk of falling. Serious injuries from a fall happen most often to people who are older than 71 years old. Teenagers and young adults ages 52-29 are also at higher risk. Common risk factors include: Weakness in the lower body. Being generally weak or confused due to long-term (chronic) illness. Dizziness or balance problems. Poor  vision. Medicines that cause dizziness or drowsiness. These may include: Medicines for your blood pressure, heart, anxiety, insomnia, or swelling (edema). Pain medicines. Muscle relaxants. Other risk factors include: Drinking alcohol. Having had a fall in the past. Having foot pain or wearing improper footwear. Working at a dangerous job. Having any of the following in your home: Tripping hazards, such as floor clutter or loose rugs. Poor lighting. Pets. Having dementia or memory loss. What actions can I take to lower my risk of falling?     Physical activity Stay physically fit. Do strength and balance exercises. Consider taking a regular class to build strength and balance. Yoga and tai chi are good options. Vision Have your eyes checked every year and your prescription for glasses or contacts updated as needed. Shoes and walking aids Wear non-skid shoes. Wear shoes that have rubber soles and low heels. Do not wear high heels. Do not walk around the house in socks or slippers. Use a cane or walker as told by your provider. Home safety Attach secure railings on both sides of your stairs. Install grab bars for your bathtub, shower, and toilet. Use a non-skid mat in your bathtub or shower. Attach bath mats securely with double-sided, non-slip rug tape. Use good lighting in all rooms. Keep a flashlight near your bed. Make sure there is a clear path from your bed to the bathroom. Use night-lights. Do not use throw rugs. Make sure all carpeting is taped or tacked down securely. Remove all clutter from walkways and stairways, including extension cords. Repair uneven or broken steps and floors. Avoid walking on icy or slippery surfaces. Walk on the grass instead of on icy or slick sidewalks. Use ice melter to get rid of ice on walkways in the winter. Use a cordless phone. Questions to ask your health care provider Can you help me check my risk for a fall? Do any of my medicines  make me more likely to fall? Should I take a vitamin D supplement? What exercises can I do to improve my strength and balance? Should I make an appointment to have my vision checked? Do I need a bone density test to check for weak bones (osteoporosis)? Would it help to use a cane or a walker? Where to find more information Centers for Disease Control and Prevention, STEAD[employer]: TonerPromos.no Community-Based Fall Prevention Programs: TonerPromos.no [employer] on Aging: BaseRingTones.pl Contact a health care provider if: You fall at home. You are afraid of falling at home. You feel weak, drowsy, or dizzy. This information is not intended to replace advice given to you by your health care provider. Make sure you discuss any questions you have with your health care provider. Document Revised: 10/20/2021 Document Reviewed: 10/20/2021 Elsevier Patient Education  2024 [employer].

## 2023-02-08 NOTE — Telephone Encounter (Signed)
[facility] 01/22/23

## 2023-02-09 ENCOUNTER — Telehealth: Payer: Self-pay

## 2023-02-09 DIAGNOSIS — G4733 Obstructive sleep apnea (adult) (pediatric): Secondary | ICD-10-CM | POA: Diagnosis not present

## 2023-02-09 DIAGNOSIS — J9811 Atelectasis: Secondary | ICD-10-CM | POA: Diagnosis not present

## 2023-02-09 DIAGNOSIS — E1165 Type 2 diabetes mellitus with hyperglycemia: Secondary | ICD-10-CM | POA: Diagnosis not present

## 2023-02-09 DIAGNOSIS — M1610 Unilateral primary osteoarthritis, unspecified hip: Secondary | ICD-10-CM | POA: Diagnosis not present

## 2023-02-09 DIAGNOSIS — M47812 Spondylosis without myelopathy or radiculopathy, cervical region: Secondary | ICD-10-CM | POA: Diagnosis not present

## 2023-02-09 DIAGNOSIS — J019 Acute sinusitis, unspecified: Secondary | ICD-10-CM | POA: Diagnosis not present

## 2023-02-09 DIAGNOSIS — M1712 Unilateral primary osteoarthritis, left knee: Secondary | ICD-10-CM | POA: Diagnosis not present

## 2023-02-09 DIAGNOSIS — I10 Essential (primary) hypertension: Secondary | ICD-10-CM | POA: Diagnosis not present

## 2023-02-09 DIAGNOSIS — B9689 Other specified bacterial agents as the cause of diseases classified elsewhere: Secondary | ICD-10-CM | POA: Diagnosis not present

## 2023-02-09 DIAGNOSIS — I21A1 Myocardial infarction type 2: Secondary | ICD-10-CM | POA: Diagnosis not present

## 2023-02-09 DIAGNOSIS — G4700 Insomnia, unspecified: Secondary | ICD-10-CM | POA: Diagnosis not present

## 2023-02-09 MED ORDER — ACCU-CHEK SOFTCLIX LANCETS MISC: 3 refills | Status: DC

## 2023-02-09 NOTE — Telephone Encounter (Signed)
Called and left message to call office back

## 2023-02-09 NOTE — Telephone Encounter (Signed)
Copied from CRM [id number]. Topic: Clinical - Home Health Verbal Orders >> Feb 09, 2023 12:55 PM Adelina Mings wrote: Caller/Agency: [facility] Callback Number: [phone] Service Requested: Occupational Therapy Frequency: 2x a week for 4 weeks 1x a week for 2 weeks  Any new concerns about the patient? No

## 2023-02-09 NOTE — Telephone Encounter (Signed)
Copied from CRM [id number]. Topic: Clinical - Medication Refill >> Feb 09, 2023  2:06 PM Fonda Kinder J wrote: Most Recent Primary Care Visit:  Provider: Recardo Evangelist A  Department: [facility]  Visit Type: MEDICARE AWV, SEQUENTIAL  Date: 02/08/2023  Medication: Accu-Chek Softclix Lancets lancets  Pt needs prescription to say 4x a day instead of 1, pt is now on insulin & was ordered 4x a day by ED provider as well as home health.  Has the patient contacted their pharmacy? Yes (Agent: If no, request that the patient contact the pharmacy for the refill. If patient does not wish to contact the pharmacy document the reason why and proceed with request.) (Agent: If yes, when and what did the pharmacy advise?) Contact PCP  Is this the correct pharmacy for this prescription? Yes If no, delete pharmacy and type the correct one.  This is the patient's preferred pharmacy:  [id number], [place] - [address] [place] [address] Phone: [phone] Fax: [phone]   Has the prescription been filled recently? Yes  Is the patient out of the medication? Yes  Has the patient been seen for an appointment in the last year OR does the patient have an upcoming appointment? Yes  Can we respond through MyChart? No  Agent: Please be advised that Rx refills may take up to 3 business days. We ask that you follow-up with your pharmacy.

## 2023-02-11 DIAGNOSIS — G4733 Obstructive sleep apnea (adult) (pediatric): Secondary | ICD-10-CM | POA: Diagnosis not present

## 2023-02-11 DIAGNOSIS — Z794 Long term (current) use of insulin: Secondary | ICD-10-CM | POA: Diagnosis not present

## 2023-02-11 DIAGNOSIS — B9689 Other specified bacterial agents as the cause of diseases classified elsewhere: Secondary | ICD-10-CM | POA: Diagnosis not present

## 2023-02-11 DIAGNOSIS — J9811 Atelectasis: Secondary | ICD-10-CM | POA: Diagnosis not present

## 2023-02-11 DIAGNOSIS — M47812 Spondylosis without myelopathy or radiculopathy, cervical region: Secondary | ICD-10-CM | POA: Diagnosis not present

## 2023-02-11 DIAGNOSIS — G4700 Insomnia, unspecified: Secondary | ICD-10-CM | POA: Diagnosis not present

## 2023-02-11 DIAGNOSIS — M1610 Unilateral primary osteoarthritis, unspecified hip: Secondary | ICD-10-CM | POA: Diagnosis not present

## 2023-02-11 DIAGNOSIS — M1712 Unilateral primary osteoarthritis, left knee: Secondary | ICD-10-CM | POA: Diagnosis not present

## 2023-02-11 DIAGNOSIS — J019 Acute sinusitis, unspecified: Secondary | ICD-10-CM | POA: Diagnosis not present

## 2023-02-11 DIAGNOSIS — Z9181 History of falling: Secondary | ICD-10-CM | POA: Diagnosis not present

## 2023-02-11 DIAGNOSIS — I21A1 Myocardial infarction type 2: Secondary | ICD-10-CM | POA: Diagnosis not present

## 2023-02-11 DIAGNOSIS — E039 Hypothyroidism, unspecified: Secondary | ICD-10-CM | POA: Diagnosis not present

## 2023-02-11 DIAGNOSIS — E1165 Type 2 diabetes mellitus with hyperglycemia: Secondary | ICD-10-CM | POA: Diagnosis not present

## 2023-02-11 DIAGNOSIS — I10 Essential (primary) hypertension: Secondary | ICD-10-CM | POA: Diagnosis not present

## 2023-02-11 DIAGNOSIS — E876 Hypokalemia: Secondary | ICD-10-CM | POA: Diagnosis not present

## 2023-02-11 DIAGNOSIS — E872 Acidosis, unspecified: Secondary | ICD-10-CM | POA: Diagnosis not present

## 2023-02-11 DIAGNOSIS — Z792 Long term (current) use of antibiotics: Secondary | ICD-10-CM | POA: Diagnosis not present

## 2023-02-12 DIAGNOSIS — Z794 Long term (current) use of insulin: Secondary | ICD-10-CM | POA: Diagnosis not present

## 2023-02-12 DIAGNOSIS — M1610 Unilateral primary osteoarthritis, unspecified hip: Secondary | ICD-10-CM | POA: Diagnosis not present

## 2023-02-12 DIAGNOSIS — J9811 Atelectasis: Secondary | ICD-10-CM | POA: Diagnosis not present

## 2023-02-12 DIAGNOSIS — B9689 Other specified bacterial agents as the cause of diseases classified elsewhere: Secondary | ICD-10-CM | POA: Diagnosis not present

## 2023-02-12 DIAGNOSIS — M1712 Unilateral primary osteoarthritis, left knee: Secondary | ICD-10-CM | POA: Diagnosis not present

## 2023-02-12 DIAGNOSIS — I21A1 Myocardial infarction type 2: Secondary | ICD-10-CM | POA: Diagnosis not present

## 2023-02-12 DIAGNOSIS — G4700 Insomnia, unspecified: Secondary | ICD-10-CM | POA: Diagnosis not present

## 2023-02-12 DIAGNOSIS — E876 Hypokalemia: Secondary | ICD-10-CM | POA: Diagnosis not present

## 2023-02-12 DIAGNOSIS — E872 Acidosis, unspecified: Secondary | ICD-10-CM | POA: Diagnosis not present

## 2023-02-12 DIAGNOSIS — I10 Essential (primary) hypertension: Secondary | ICD-10-CM | POA: Diagnosis not present

## 2023-02-12 DIAGNOSIS — Z9181 History of falling: Secondary | ICD-10-CM | POA: Diagnosis not present

## 2023-02-12 DIAGNOSIS — G4733 Obstructive sleep apnea (adult) (pediatric): Secondary | ICD-10-CM | POA: Diagnosis not present

## 2023-02-12 DIAGNOSIS — E1165 Type 2 diabetes mellitus with hyperglycemia: Secondary | ICD-10-CM | POA: Diagnosis not present

## 2023-02-12 DIAGNOSIS — E039 Hypothyroidism, unspecified: Secondary | ICD-10-CM | POA: Diagnosis not present

## 2023-02-12 DIAGNOSIS — M47812 Spondylosis without myelopathy or radiculopathy, cervical region: Secondary | ICD-10-CM | POA: Diagnosis not present

## 2023-02-12 DIAGNOSIS — Z792 Long term (current) use of antibiotics: Secondary | ICD-10-CM | POA: Diagnosis not present

## 2023-02-12 DIAGNOSIS — J019 Acute sinusitis, unspecified: Secondary | ICD-10-CM | POA: Diagnosis not present

## 2023-02-14 NOTE — Progress Notes (Unsigned)
Subjective:  Patient ID: Whitney Eaton, female    DOB: Jul 19, 1950  Age: 72 y.o. MRN: [id number]  Chief Complaint  Patient presents with   Medical Management of Chronic Issues    HPI Diabetes:  Complications: hypertension  Glucose checking: not checking. Most recent A1C: 7.3  Current medications: Currently on insulin therapy (Lantus 35 units AM, Humulin R before meals and at bedtime.) Has not started mounjaro. She has Took last fluconazole this morning. Helped with intertrigo.   Recommend increase preprandial (before meals) insulin    Check sugars before meals Increase to 6 U prior to breakfast, lunch, and supper.  71-150 0 units 151-200 2 Units 201-250 4 Units 251-300 6 Units 301-350 8 units 351-400 10 Units.  > 400 10 Units call office.   Hyperlipidemia: Not taking any medicines. Intolerant to statins.  Hypertension: Current medications: Valsartan/hydrochlorothiazide 320/25 mg once daily.  OSA: Wearing cpap. Changing dme to synapse.   Asthma: Not using breztri.  Psoriatic arthritis:  Mobility has worsened. Taking advil which helps some. Heating pad helps. Had fall earlier this year. No major injuries.   She has been staying at home.  She texts a lot. She sees her family when they come to visit.      02/08/2023    2:43 PM 02/19/2022    8:26 AM 04/17/2021    3:20 PM 11/01/2020   11:25 AM 10/28/2020    9:53 AM  Depression screen PHQ 2/9  Decreased Interest 0 1 3 0 0  Down, Depressed, Hopeless 0 1 3 0 0  PHQ - 2 Score 0 2 6 0 0  Altered sleeping 0 2 2    Tired, decreased energy 0 3 3    Change in appetite 0 1 2    Feeling bad or failure about yourself  0 1 2    Trouble concentrating 0 0 0    Moving slowly or fidgety/restless 0 0 0    Suicidal thoughts 0 0 0    PHQ-9 Score 0 9 15    Difficult doing work/chores Not difficult at all Somewhat difficult Very difficult          02/03/2023    5:24 PM  Fall Risk   Falls in the past year? 1  Number falls in past yr:  1  Injury with Fall? 1  Risk for fall due to : History of fall(s);Impaired balance/gait;Orthopedic patient;Impaired mobility  Follow up Education provided;Falls prevention discussed    Patient Care Team: Blane Ohara, MD as PCP - General (Family Medicine) Denamur, Darin, DC as Referring Physician (Chiropractic Medicine) Jonell Cluck, PA-C (Dermatology) Yvette Rack, OD (Optometry) Prescilla Sours, FNP as Nurse Practitioner (Urology) Leron Croak, Ray A, OD (Optometry)   Review of Systems  Constitutional:  Negative for chills, fatigue and fever.  HENT:  Negative for congestion, ear pain, rhinorrhea and sore throat.   Respiratory:  Negative for cough and shortness of breath.   Cardiovascular:  Negative for chest pain.  Gastrointestinal:  Negative for abdominal pain, constipation, diarrhea, nausea and vomiting.  Genitourinary:  Negative for dysuria and urgency.  Musculoskeletal:  Positive for arthralgias and back pain. Negative for myalgias.  Neurological:  Negative for dizziness, weakness, light-headedness and headaches.  Psychiatric/Behavioral:  Negative for dysphoric mood. The patient is not nervous/anxious.     Current Outpatient Medications on File Prior to Visit  Medication Sig Dispense Refill   blood glucose meter kit and supplies KIT Check sugars fasting. E11.69 1 each 0   tirzepatide (  MOUNJARO) 2.5 MG/0.5ML Pen Inject 2.5 mg into the skin once a week. 2 mL 2   valsartan-hydrochlorothiazide (DIOVAN-HCT) 320-25 MG tablet Take 1 tablet by mouth daily. 90 tablet 1   No current facility-administered medications on file prior to visit.   Past Medical History:  Diagnosis Date   Asthma    Diabetes (HCC)    Hyperlipemia    Hypertension    Psoriasis    Sleep apnea    Thyroid disease    Past Surgical History:  Procedure Laterality Date   ABDOMINAL HYSTERECTOMY     CHOLECYSTECTOMY  2011   HYSTERECTOMY ABDOMINAL WITH SALPINGECTOMY     OOPHORECTOMY  1980    Family History   Problem Relation Age of Onset   Alzheimer's disease Mother    Breast cancer Mother    Arthritis Mother    Diabetes Mother    Eczema Father    CAD Father    Diabetes Father    Parkinson's disease Father    Breast cancer Sister    Neurologic Disorder Brother    Glaucoma Maternal Grandmother    Heart disease Other    Social History   Socioeconomic History   Marital status: Married    Spouse name: Not on file   Number of children: 2   Years of education: Not on file   Highest education level: GED or equivalent  Occupational History   Occupation: homemaker  Tobacco Use   Smoking status: Never   Smokeless tobacco: Never  Vaping Use   Vaping status: Never Used  Substance and Sexual Activity   Alcohol use: Never   Drug use: Never   Sexual activity: Not on file  Other Topics Concern   Not on file  Social History Narrative   Not on file   Social Drivers of Health   Financial Resource Strain: Low Risk  (02/14/2023)   Overall Financial Resource Strain (CARDIA)    Difficulty of Paying Living Expenses: Not hard at all  Food Insecurity: No Food Insecurity (02/14/2023)   Hunger Vital Sign    Worried About Running Out of Food in the Last Year: Never true    Ran Out of Food in the Last Year: Never true  Transportation Needs: No Transportation Needs (02/14/2023)   PRAPARE - Administrator, Civil Service (Medical): No    Lack of Transportation (Non-Medical): No  Physical Activity: Insufficiently Active (02/14/2023)   Exercise Vital Sign    Days of Exercise per Week: 3 days    Minutes of Exercise per Session: 10 min  Stress: No Stress Concern Present (02/14/2023)   [employer] of Occupational Health - Occupational Stress Questionnaire    Feeling of Stress : Only a little  Social Connections: Unknown (02/14/2023)   Social Connection and Isolation Panel [NHANES]    Frequency of Communication with Friends and Family: Three times a week    Frequency of Social  Gatherings with Friends and Family: Once a week    Attends Religious Services: Patient declined    Database administrator or Organizations: Yes    Attends Engineer, structural: Patient declined    Marital Status: Married    Objective:  BP 132/78   Pulse 79   Temp 98.2 F (36.8 C)   Ht 5\' 8"  (1.727 m)   Wt 274 lb (124.3 kg)   SpO2 95%   BMI 41.66 kg/m      02/15/2023    8:15 AM 02/08/2023    2:31  PM 01/25/2023    9:12 AM  BP/Weight  Systolic BP 132 -- 125  Diastolic BP 78 -- 80  Wt. (Lbs) 274 289 289  BMI 41.66 kg/m2 46.65 kg/m2 46.65 kg/m2    Physical Exam Vitals reviewed.  Constitutional:      Appearance: Normal appearance. She is obese.  Neck:     Vascular: No carotid bruit.  Cardiovascular:     Rate and Rhythm: Normal rate and regular rhythm.     Heart sounds: Normal heart sounds.  Pulmonary:     Effort: Pulmonary effort is normal. No respiratory distress.     Breath sounds: Normal breath sounds.  Abdominal:     General: Abdomen is flat. Bowel sounds are normal.     Palpations: Abdomen is soft.     Tenderness: There is no abdominal tenderness.  Neurological:     Mental Status: She is alert and oriented to person, place, and time.  Psychiatric:        Mood and Affect: Mood normal.        Behavior: Behavior normal.     Diabetic Foot Exam - Simple   Simple Foot Form Diabetic Foot exam was performed with the following findings: Yes 02/15/2023  9:06 AM  Visual Inspection No deformities, no ulcerations, no other skin breakdown bilaterally: Yes Sensation Testing Intact to touch and monofilament testing bilaterally: Yes Pulse Check Posterior Tibialis and Dorsalis pulse intact bilaterally: Yes Comments      Lab Results  Component Value Date   WBC 9.7 02/15/2023   HGB 13.4 02/15/2023   HCT 41.0 02/15/2023   PLT 288 02/15/2023   GLUCOSE 174 (H) 02/15/2023   CHOL 218 (H) 02/15/2023   TRIG 232 (H) 02/15/2023   HDL 38 (L) 02/15/2023   LDLCALC  138 (H) 02/15/2023   ALT 41 (H) 02/15/2023   AST 42 (H) 02/15/2023   NA 135 02/15/2023   K 4.2 02/15/2023   CL 94 (L) 02/15/2023   CREATININE 0.87 02/15/2023   BUN 18 02/15/2023   CO2 24 02/15/2023   TSH 15.700 (H) 02/15/2023   HGBA1C 12.7 (H) 02/15/2023   MICROALBUR 10 04/24/2020      Assessment & Plan:    Hypertension associated with diabetes (HCC) Assessment & Plan: Well controlled.  No changes to medicines. Continue Valsartan/hydrochlorothiazide 320/25 mg once daily. Continue to work on eating a healthy diet and exercise.  Labs drawn today.   Orders: -     CBC with Differential/Platelet -     Comprehensive metabolic panel  Other specified hypothyroidism Assessment & Plan: Previously well controlled Continue Synthroid at current dose  Recheck TSH and adjust Synthroid as indicated    Orders: -     TSH -     T4, free -     Zolpidem Tartrate; TAKE 1 TABLET(10 MG) BY MOUTH AT BEDTIME  Dispense: 30 tablet; Refill: 5  Uncontrolled type 2 diabetes mellitus with hyperglycemia [facility]) Assessment & Plan: Start Mounjaro to help better control glucose levels and assist with weight loss. Continue taking Lantus and Humulin as prescribed, and we may switch Humulin to a pen form for easier use. We will also initiate continuous glucose monitoring with Dexcom to better track your blood sugar levels.   Orders: -     Hemoglobin A1c -     Microalbumin / creatinine urine ratio -     Droplet Pen Needles; use as directed  Dispense: 100 each; Refill: 2  Vitamin D insufficiency Assessment & Plan: Check  levels.  Orders: -     VITAMIN D 25 Hydroxy (Vit-D Deficiency, Fractures) -     Vitamin D (Ergocalciferol); Take 1 capsule (50,000 Units total) by mouth every 7 (seven) days.  Dispense: 12 capsule; Refill: 0  Mixed hyperlipidemia Assessment & Plan: Well controlled.  No medicines.  Continue to work on eating a healthy diet and exercise.  Labs drawn today.    Orders: -     Lipid  panel  Hypokalemia Assessment & Plan: The current medical regimen is effective;  continue present plan and medications.   Orders: -     Potassium Chloride Crys ER; Take 1 tablet (10 mEq total) by mouth 2 (two) times daily.  Dispense: 180 tablet; Refill: 0  Acute cystitis with hematuria Assessment & Plan: Check UA Order urine culture  Orders: -     POCT URINALYSIS DIP (CLINITEK) -     Urine Culture  Psoriatic arthritis [facility]) Assessment & Plan: Management per specialist. The current medical regimen is effective;  continue present plan and medications.    Candidiasis Assessment & Plan: Improved.   Other orders -     Accu-Chek Softclix Lancets; CHECK FASTING BLOOD SUGAR ONCE DAILY  Dispense: 100 each; Refill: 3 -     Lantus SoloStar; Inject 35 Units into the skin daily.  Dispense: 15 mL; Refill: 1 -     Accu-Chek Guide Test; Use as instructed  Dispense: 100 each; Refill: 12 -     HumuLIN N KwikPen; SSI with max of 30 U total daily for preprandial use.  Dispense: 15 mL; Refill: 11     Meds ordered this encounter  Medications   Accu-Chek Softclix Lancets lancets    Sig: CHECK FASTING BLOOD SUGAR ONCE DAILY    Dispense:  100 each    Refill:  3   DROPLET PEN NEEDLES 31G X 5 MM MISC    Sig: use as directed    Dispense:  100 each    Refill:  2   LANTUS SOLOSTAR 100 UNIT/ML Solostar Pen    Sig: Inject 35 Units into the skin daily.    Dispense:  15 mL    Refill:  1   potassium chloride (KLOR-CON M) 10 MEQ tablet    Sig: Take 1 tablet (10 mEq total) by mouth 2 (two) times daily.    Dispense:  180 tablet    Refill:  0   Vitamin D, Ergocalciferol, (DRISDOL) 1.25 MG (50000 UNIT) CAPS capsule    Sig: Take 1 capsule (50,000 Units total) by mouth every 7 (seven) days.    Dispense:  12 capsule    Refill:  0   zolpidem (AMBIEN) 10 MG tablet    Sig: TAKE 1 TABLET(10 MG) BY MOUTH AT BEDTIME    Dispense:  30 tablet    Refill:  5   glucose blood (ACCU-CHEK GUIDE TEST) test strip     Sig: Use as instructed    Dispense:  100 each    Refill:  12   Insulin NPH, Human,, Isophane, (HUMULIN N KWIKPEN) 100 UNIT/ML Kiwkpen    Sig: SSI with max of 30 U total daily for preprandial use.    Dispense:  15 mL    Refill:  11    Orders Placed This Encounter  Procedures   Urine Culture   CBC with Differential/Platelet   Comprehensive metabolic panel   Hemoglobin A1c   Lipid panel   Microalbumin / creatinine urine ratio   VITAMIN D 25 Hydroxy (Vit-D  Deficiency, Fractures)   TSH   T4, free   POCT URINALYSIS DIP (CLINITEK)     Follow-up: Return in about 3 months (around 05/16/2023) for chronic follow up.   I,Marla I Leal-Borjas,acting as a scribe for Blane Ohara, MD.,have documented all relevant documentation on the behalf of Blane Ohara, MD,as directed by  Blane Ohara, MD while in the presence of Blane Ohara, MD.   An After Visit Summary was printed and given to the patient.  Blane Ohara, MD [facility] [phone]

## 2023-02-15 ENCOUNTER — Ambulatory Visit: Payer: Medicare Other | Admitting: Family Medicine

## 2023-02-15 ENCOUNTER — Telehealth: Payer: Self-pay | Admitting: Family Medicine

## 2023-02-15 VITALS — BP 132/78 | HR 79 | Temp 98.2°F | Ht 68.0 in | Wt 274.0 lb

## 2023-02-15 DIAGNOSIS — E038 Other specified hypothyroidism: Secondary | ICD-10-CM | POA: Diagnosis not present

## 2023-02-15 DIAGNOSIS — N3001 Acute cystitis with hematuria: Secondary | ICD-10-CM

## 2023-02-15 DIAGNOSIS — L4050 Arthropathic psoriasis, unspecified: Secondary | ICD-10-CM

## 2023-02-15 DIAGNOSIS — E559 Vitamin D deficiency, unspecified: Secondary | ICD-10-CM | POA: Diagnosis not present

## 2023-02-15 DIAGNOSIS — B379 Candidiasis, unspecified: Secondary | ICD-10-CM | POA: Diagnosis not present

## 2023-02-15 DIAGNOSIS — I700 Atherosclerosis of aorta: Secondary | ICD-10-CM

## 2023-02-15 DIAGNOSIS — E782 Mixed hyperlipidemia: Secondary | ICD-10-CM

## 2023-02-15 DIAGNOSIS — E876 Hypokalemia: Secondary | ICD-10-CM

## 2023-02-15 DIAGNOSIS — E1165 Type 2 diabetes mellitus with hyperglycemia: Secondary | ICD-10-CM

## 2023-02-15 DIAGNOSIS — E1159 Type 2 diabetes mellitus with other circulatory complications: Secondary | ICD-10-CM | POA: Diagnosis not present

## 2023-02-15 DIAGNOSIS — I152 Hypertension secondary to endocrine disorders: Secondary | ICD-10-CM | POA: Diagnosis not present

## 2023-02-15 LAB — POCT URINALYSIS DIP (CLINITEK)
Bilirubin, UA: NEGATIVE
Glucose, UA: NEGATIVE mg/dL
Ketones, POC UA: NEGATIVE mg/dL
Nitrite, UA: NEGATIVE
POC PROTEIN,UA: NEGATIVE
Spec Grav, UA: 1.02 (ref 1.010–1.025)
Urobilinogen, UA: 0.2 U/dL
pH, UA: 5 (ref 5.0–8.0)

## 2023-02-15 LAB — HEMOGLOBIN A1C
Est. average glucose Bld gHb Est-mCnc: 318 mg/dL
Hgb A1c MFr Bld: 12.7 % — ABNORMAL HIGH (ref 4.8–5.6)

## 2023-02-15 MED ORDER — DROPLET PEN NEEDLES 31G X 5 MM MISC: 2 refills | Status: DC

## 2023-02-15 MED ORDER — VITAMIN D (ERGOCALCIFEROL) 1.25 MG (50000 UNIT) PO CAPS: 50000.0000 [IU] | ORAL_CAPSULE | ORAL | 0 refills | Status: DC

## 2023-02-15 MED ORDER — LANTUS SOLOSTAR 100 UNIT/ML ~~LOC~~ SOPN: 35.0000 [IU] | PEN_INJECTOR | Freq: Every day | SUBCUTANEOUS | 1 refills | Status: DC

## 2023-02-15 MED ORDER — ZOLPIDEM TARTRATE 10 MG PO TABS: ORAL_TABLET | ORAL | 5 refills | Status: DC

## 2023-02-15 MED ORDER — ACCU-CHEK SOFTCLIX LANCETS MISC: 3 refills | Status: DC

## 2023-02-15 MED ORDER — ACCU-CHEK GUIDE TEST VI STRP: ORAL_STRIP | 12 refills | Status: DC

## 2023-02-15 MED ORDER — POTASSIUM CHLORIDE CRYS ER 10 MEQ PO TBCR: 10.0000 meq | EXTENDED_RELEASE_TABLET | Freq: Two times a day (BID) | ORAL | 0 refills | Status: DC

## 2023-02-15 NOTE — Patient Instructions (Signed)
VISIT SUMMARY:  You came in today for a follow-up visit after your recent hospitalization due to high blood sugar levels. You have been managing your diabetes with insulin and Mounjaro, and your blood sugar levels have been mostly under 200. You have also been more diligent with self-care, exercising at home, eating healthier, and have lost 17 pounds since your hospitalization. You are taking your thyroid medication regularly, and your symptoms from a yeast infection have improved with fluconazole. You also reported blurry vision, which you attribute to your high blood sugar levels, and you are scheduled to see an eye doctor in January.  YOUR PLAN:  -UNCONTROLLED DIABETES MELLITUS: Uncontrolled diabetes means that your blood sugar levels are not within the target range. We will start you on Mounjaro to help better control your glucose levels and assist with weight loss. Continue taking Lantus and Humulin as prescribed, and we may switch Humulin to a pen form for easier use. We will also initiate continuous glucose monitoring with Dexcom to better track your blood sugar levels.   -CANDIDIASIS: Candidiasis is a yeast infection. You have shown improvement with fluconazole, but we will continue to monitor your symptoms to ensure they fully resolve.  -PSORIASIS: Psoriasis is a chronic skin condition that causes red, itchy, and scaly patches. You have a dermatology appointment scheduled, and we will continue with your current management plan until then.  -HYPOTHYROIDISM: Hypothyroidism is when your thyroid gland does not produce enough thyroid hormone. We will check your thyroid levels today and continue your current thyroid medication regimen.  -GENERAL HEALTH MAINTENANCE: Continue with your physical and occupational therapy at home, maintain regular exercise, and follow a healthy diet. You received a flu shot during your hospital stay. Follow up with ophthalmology in January for your blurry vision.  Consider getting a Life Alert system for safety due to your history of falls. We will also start continuous glucose monitoring with Dexcom.  INSTRUCTIONS:  Please follow up with ophthalmology in January for your blurry vision. We will check your thyroid levels today. Consider getting a Life Alert system for added safety at home.

## 2023-02-15 NOTE — Telephone Encounter (Signed)
[facility] WRITTEN ORDER FORM FOR FREE STYLE

## 2023-02-16 ENCOUNTER — Telehealth: Payer: Self-pay

## 2023-02-16 DIAGNOSIS — G4733 Obstructive sleep apnea (adult) (pediatric): Secondary | ICD-10-CM | POA: Diagnosis not present

## 2023-02-16 DIAGNOSIS — Z9181 History of falling: Secondary | ICD-10-CM | POA: Diagnosis not present

## 2023-02-16 DIAGNOSIS — J019 Acute sinusitis, unspecified: Secondary | ICD-10-CM | POA: Diagnosis not present

## 2023-02-16 DIAGNOSIS — M47812 Spondylosis without myelopathy or radiculopathy, cervical region: Secondary | ICD-10-CM | POA: Diagnosis not present

## 2023-02-16 DIAGNOSIS — I21A1 Myocardial infarction type 2: Secondary | ICD-10-CM | POA: Diagnosis not present

## 2023-02-16 DIAGNOSIS — Z794 Long term (current) use of insulin: Secondary | ICD-10-CM | POA: Diagnosis not present

## 2023-02-16 DIAGNOSIS — Z792 Long term (current) use of antibiotics: Secondary | ICD-10-CM | POA: Diagnosis not present

## 2023-02-16 DIAGNOSIS — I10 Essential (primary) hypertension: Secondary | ICD-10-CM | POA: Diagnosis not present

## 2023-02-16 DIAGNOSIS — M1712 Unilateral primary osteoarthritis, left knee: Secondary | ICD-10-CM | POA: Diagnosis not present

## 2023-02-16 DIAGNOSIS — G4700 Insomnia, unspecified: Secondary | ICD-10-CM | POA: Diagnosis not present

## 2023-02-16 DIAGNOSIS — E1165 Type 2 diabetes mellitus with hyperglycemia: Secondary | ICD-10-CM | POA: Diagnosis not present

## 2023-02-16 DIAGNOSIS — J9811 Atelectasis: Secondary | ICD-10-CM | POA: Diagnosis not present

## 2023-02-16 DIAGNOSIS — E872 Acidosis, unspecified: Secondary | ICD-10-CM | POA: Diagnosis not present

## 2023-02-16 DIAGNOSIS — E039 Hypothyroidism, unspecified: Secondary | ICD-10-CM | POA: Diagnosis not present

## 2023-02-16 DIAGNOSIS — B9689 Other specified bacterial agents as the cause of diseases classified elsewhere: Secondary | ICD-10-CM | POA: Diagnosis not present

## 2023-02-16 DIAGNOSIS — E876 Hypokalemia: Secondary | ICD-10-CM | POA: Diagnosis not present

## 2023-02-16 DIAGNOSIS — M1610 Unilateral primary osteoarthritis, unspecified hip: Secondary | ICD-10-CM | POA: Diagnosis not present

## 2023-02-16 LAB — COMPREHENSIVE METABOLIC PANEL
ALT: 41 [IU]/L — ABNORMAL HIGH (ref 0–32)
AST: 42 [IU]/L — ABNORMAL HIGH (ref 0–40)
Albumin: 4.2 g/dL (ref 3.8–4.8)
Alkaline Phosphatase: 94 [IU]/L (ref 44–121)
BUN/Creatinine Ratio: 21 (ref 12–28)
BUN: 18 mg/dL (ref 8–27)
Bilirubin Total: 0.7 mg/dL (ref 0.0–1.2)
CO2: 24 mmol/L (ref 20–29)
Calcium: 10.5 mg/dL — ABNORMAL HIGH (ref 8.7–10.3)
Chloride: 94 mmol/L — ABNORMAL LOW (ref 96–106)
Creatinine, Ser: 0.87 mg/dL (ref 0.57–1.00)
Globulin, Total: 3 g/dL (ref 1.5–4.5)
Glucose: 174 mg/dL — ABNORMAL HIGH (ref 70–99)
Potassium: 4.2 mmol/L (ref 3.5–5.2)
Sodium: 135 mmol/L (ref 134–144)
Total Protein: 7.2 g/dL (ref 6.0–8.5)
eGFR: 71 mL/min/{1.73_m2} (ref >59)

## 2023-02-16 LAB — CBC WITH DIFFERENTIAL/PLATELET
Basophils Absolute: 0.1 10*3/uL (ref 0.0–0.2)
Basos: 1 %
EOS (ABSOLUTE): 0.2 10*3/uL (ref 0.0–0.4)
Eos: 2 %
Hematocrit: 41 % (ref 34.0–46.6)
Hemoglobin: 13.4 g/dL (ref 11.1–15.9)
Immature Grans (Abs): 0.1 10*3/uL (ref 0.0–0.1)
Immature Granulocytes: 1 %
Lymphocytes Absolute: 2.2 10*3/uL (ref 0.7–3.1)
Lymphs: 23 %
MCH: 31.1 pg (ref 26.6–33.0)
MCHC: 32.7 g/dL (ref 31.5–35.7)
MCV: 95 fL (ref 79–97)
Monocytes Absolute: 0.7 10*3/uL (ref 0.1–0.9)
Monocytes: 7 %
Neutrophils Absolute: 6.5 10*3/uL (ref 1.4–7.0)
Neutrophils: 66 %
Platelets: 288 10*3/uL (ref 150–450)
RBC: 4.31 x10E6/uL (ref 3.77–5.28)
RDW: 12.2 % (ref 11.7–15.4)
WBC: 9.7 10*3/uL (ref 3.4–10.8)

## 2023-02-16 LAB — MICROALBUMIN / CREATININE URINE RATIO
Creatinine, Urine: 79.7 mg/dL
Microalb/Creat Ratio: 5 mg/g{creat} (ref 0–29)
Microalbumin, Urine: 3.9 ug/mL

## 2023-02-16 LAB — LIPID PANEL
Chol/HDL Ratio: 5.7 {ratio} — ABNORMAL HIGH (ref 0.0–4.4)
Cholesterol, Total: 218 mg/dL — ABNORMAL HIGH (ref 100–199)
HDL: 38 mg/dL — ABNORMAL LOW (ref >39)
LDL Chol Calc (NIH): 138 mg/dL — ABNORMAL HIGH (ref 0–99)
Triglycerides: 232 mg/dL — ABNORMAL HIGH (ref 0–149)
VLDL Cholesterol Cal: 42 mg/dL — ABNORMAL HIGH (ref 5–40)

## 2023-02-16 LAB — URINE CULTURE

## 2023-02-16 LAB — T4, FREE: Free T4: 1.17 ng/dL (ref 0.82–1.77)

## 2023-02-16 LAB — VITAMIN D 25 HYDROXY (VIT D DEFICIENCY, FRACTURES): Vit D, 25-Hydroxy: 30.8 ng/mL (ref 30.0–100.0)

## 2023-02-16 LAB — TSH: TSH: 15.7 u[IU]/mL — ABNORMAL HIGH (ref 0.450–4.500)

## 2023-02-16 MED ORDER — BD VEO INSULIN SYRINGE U/F 31G X 15/64" 0.3 ML MISC: 11 refills | Status: DC

## 2023-02-16 NOTE — Telephone Encounter (Signed)
Copied from CRM [id number]. Topic: Clinical - Medication Refill >> Feb 16, 2023 10:58 AM Orinda Kenner C wrote: Most Recent Primary Care Visit:  Provider: [facility]  Department: [facility]  Visit Type: ACUTE  Date: 02/15/2023  Medication: BD VEO INSULIN SYRINGE U/F 31G X 15/64" 0.3 ML MISC   Has the patient contacted their pharmacy? Yes (Agent: If no, request that the patient contact the pharmacy for the refill. If patient does not wish to contact the pharmacy document the reason why and proceed with request.) (Agent: If yes, when and what did the pharmacy advise?)  Is this the correct pharmacy for this prescription? Yes If no, delete pharmacy and type the correct one.  This is the patient's preferred pharmacy:  [id number], [place] - [address] [place] [address] Phone: [phone] Fax: [phone]   Has the prescription been filled recently?   Is the patient out of the medication? Yes, running out today.   Has the patient been seen for an appointment in the last year OR does the patient have an upcoming appointment? Yes  Can we respond through MyChart? No. BD VEO INSULIN SYRINGE U/F 31G X 15/64" 0.3 ML MISC qty 100 but pt is doing 4 inj a day. Pt is worried the insurance it's too early to fill, but she wants to fill it today. Pt would like to discuss with nurse about another possible med the provider had mentioned yesterday. Pls c/b at [phone].   Agent: Please be advised that Rx refills may take up to 3 business days. We ask that you follow-up with your pharmacy.

## 2023-02-17 ENCOUNTER — Encounter: Payer: Self-pay | Admitting: Family Medicine

## 2023-02-17 DIAGNOSIS — I10 Essential (primary) hypertension: Secondary | ICD-10-CM | POA: Diagnosis not present

## 2023-02-17 DIAGNOSIS — M1712 Unilateral primary osteoarthritis, left knee: Secondary | ICD-10-CM | POA: Diagnosis not present

## 2023-02-17 DIAGNOSIS — E876 Hypokalemia: Secondary | ICD-10-CM | POA: Diagnosis not present

## 2023-02-17 DIAGNOSIS — I21A1 Myocardial infarction type 2: Secondary | ICD-10-CM | POA: Diagnosis not present

## 2023-02-17 DIAGNOSIS — M47812 Spondylosis without myelopathy or radiculopathy, cervical region: Secondary | ICD-10-CM | POA: Diagnosis not present

## 2023-02-17 DIAGNOSIS — Z792 Long term (current) use of antibiotics: Secondary | ICD-10-CM | POA: Diagnosis not present

## 2023-02-17 DIAGNOSIS — Z794 Long term (current) use of insulin: Secondary | ICD-10-CM | POA: Diagnosis not present

## 2023-02-17 DIAGNOSIS — B9689 Other specified bacterial agents as the cause of diseases classified elsewhere: Secondary | ICD-10-CM | POA: Diagnosis not present

## 2023-02-17 DIAGNOSIS — M1610 Unilateral primary osteoarthritis, unspecified hip: Secondary | ICD-10-CM | POA: Diagnosis not present

## 2023-02-17 DIAGNOSIS — E872 Acidosis, unspecified: Secondary | ICD-10-CM | POA: Diagnosis not present

## 2023-02-17 DIAGNOSIS — G4733 Obstructive sleep apnea (adult) (pediatric): Secondary | ICD-10-CM | POA: Diagnosis not present

## 2023-02-17 DIAGNOSIS — E039 Hypothyroidism, unspecified: Secondary | ICD-10-CM | POA: Diagnosis not present

## 2023-02-17 DIAGNOSIS — J9811 Atelectasis: Secondary | ICD-10-CM | POA: Diagnosis not present

## 2023-02-17 DIAGNOSIS — G4700 Insomnia, unspecified: Secondary | ICD-10-CM | POA: Diagnosis not present

## 2023-02-17 DIAGNOSIS — J019 Acute sinusitis, unspecified: Secondary | ICD-10-CM | POA: Diagnosis not present

## 2023-02-17 DIAGNOSIS — E1165 Type 2 diabetes mellitus with hyperglycemia: Secondary | ICD-10-CM | POA: Diagnosis not present

## 2023-02-17 DIAGNOSIS — Z9181 History of falling: Secondary | ICD-10-CM | POA: Diagnosis not present

## 2023-02-18 ENCOUNTER — Other Ambulatory Visit: Payer: Self-pay

## 2023-02-18 ENCOUNTER — Telehealth: Payer: Self-pay

## 2023-02-18 DIAGNOSIS — M1610 Unilateral primary osteoarthritis, unspecified hip: Secondary | ICD-10-CM | POA: Diagnosis not present

## 2023-02-18 DIAGNOSIS — E039 Hypothyroidism, unspecified: Secondary | ICD-10-CM | POA: Diagnosis not present

## 2023-02-18 DIAGNOSIS — I21A1 Myocardial infarction type 2: Secondary | ICD-10-CM | POA: Diagnosis not present

## 2023-02-18 DIAGNOSIS — B9689 Other specified bacterial agents as the cause of diseases classified elsewhere: Secondary | ICD-10-CM | POA: Diagnosis not present

## 2023-02-18 DIAGNOSIS — G4700 Insomnia, unspecified: Secondary | ICD-10-CM | POA: Diagnosis not present

## 2023-02-18 DIAGNOSIS — Z9181 History of falling: Secondary | ICD-10-CM | POA: Diagnosis not present

## 2023-02-18 DIAGNOSIS — E872 Acidosis, unspecified: Secondary | ICD-10-CM | POA: Diagnosis not present

## 2023-02-18 DIAGNOSIS — I10 Essential (primary) hypertension: Secondary | ICD-10-CM | POA: Diagnosis not present

## 2023-02-18 DIAGNOSIS — G4733 Obstructive sleep apnea (adult) (pediatric): Secondary | ICD-10-CM | POA: Diagnosis not present

## 2023-02-18 DIAGNOSIS — M47812 Spondylosis without myelopathy or radiculopathy, cervical region: Secondary | ICD-10-CM | POA: Diagnosis not present

## 2023-02-18 DIAGNOSIS — J019 Acute sinusitis, unspecified: Secondary | ICD-10-CM | POA: Diagnosis not present

## 2023-02-18 DIAGNOSIS — E1165 Type 2 diabetes mellitus with hyperglycemia: Secondary | ICD-10-CM | POA: Diagnosis not present

## 2023-02-18 DIAGNOSIS — M1712 Unilateral primary osteoarthritis, left knee: Secondary | ICD-10-CM | POA: Diagnosis not present

## 2023-02-18 DIAGNOSIS — J9811 Atelectasis: Secondary | ICD-10-CM | POA: Diagnosis not present

## 2023-02-18 DIAGNOSIS — Z792 Long term (current) use of antibiotics: Secondary | ICD-10-CM | POA: Diagnosis not present

## 2023-02-18 DIAGNOSIS — E876 Hypokalemia: Secondary | ICD-10-CM | POA: Diagnosis not present

## 2023-02-18 DIAGNOSIS — Z794 Long term (current) use of insulin: Secondary | ICD-10-CM | POA: Diagnosis not present

## 2023-02-18 MED ORDER — LEVOTHYROXINE SODIUM 100 MCG PO TABS
100.0000 ug | ORAL_TABLET | Freq: Every day | ORAL | 3 refills | Status: DC
Start: 2023-02-18 — End: 2023-05-31

## 2023-02-18 MED ORDER — HUMULIN N KWIKPEN 100 UNIT/ML ~~LOC~~ SUPN: PEN_INJECTOR | SUBCUTANEOUS | 11 refills | Status: DC

## 2023-02-18 NOTE — Telephone Encounter (Signed)
Copied from CRM [id number]. Topic: Clinical - Prescription Issue >> Feb 18, 2023  8:52 AM Gildardo Pounds wrote: Reason for CRM: Patient states the pharmacy has not received the updated prescription for levothyroxine (SYNTHROID) and humulin as discussed. Patient's callback number is [phone].

## 2023-02-18 NOTE — Telephone Encounter (Signed)
I sent prescription for levothyroxine. I did not see any prescription for humulin. Does she need a prescription for this medication ? Please advice.  Copied from CRM [id number]. Topic: Clinical - Prescription Issue >> Feb 18, 2023  8:52 AM Gildardo Pounds wrote: Reason for CRM: Patient states the pharmacy has not received the updated prescription for levothyroxine (SYNTHROID) and humulin as discussed. Patient's callback number is [phone].

## 2023-02-19 DIAGNOSIS — G4700 Insomnia, unspecified: Secondary | ICD-10-CM | POA: Diagnosis not present

## 2023-02-19 DIAGNOSIS — Z9181 History of falling: Secondary | ICD-10-CM | POA: Diagnosis not present

## 2023-02-19 DIAGNOSIS — I21A1 Myocardial infarction type 2: Secondary | ICD-10-CM | POA: Diagnosis not present

## 2023-02-19 DIAGNOSIS — Z792 Long term (current) use of antibiotics: Secondary | ICD-10-CM | POA: Diagnosis not present

## 2023-02-19 DIAGNOSIS — E039 Hypothyroidism, unspecified: Secondary | ICD-10-CM | POA: Diagnosis not present

## 2023-02-19 DIAGNOSIS — E872 Acidosis, unspecified: Secondary | ICD-10-CM | POA: Diagnosis not present

## 2023-02-19 DIAGNOSIS — J019 Acute sinusitis, unspecified: Secondary | ICD-10-CM | POA: Diagnosis not present

## 2023-02-19 DIAGNOSIS — M1610 Unilateral primary osteoarthritis, unspecified hip: Secondary | ICD-10-CM | POA: Diagnosis not present

## 2023-02-19 DIAGNOSIS — M1712 Unilateral primary osteoarthritis, left knee: Secondary | ICD-10-CM | POA: Diagnosis not present

## 2023-02-19 DIAGNOSIS — E876 Hypokalemia: Secondary | ICD-10-CM | POA: Diagnosis not present

## 2023-02-19 DIAGNOSIS — G4733 Obstructive sleep apnea (adult) (pediatric): Secondary | ICD-10-CM | POA: Diagnosis not present

## 2023-02-19 DIAGNOSIS — I10 Essential (primary) hypertension: Secondary | ICD-10-CM | POA: Diagnosis not present

## 2023-02-19 DIAGNOSIS — Z794 Long term (current) use of insulin: Secondary | ICD-10-CM | POA: Diagnosis not present

## 2023-02-19 DIAGNOSIS — E1165 Type 2 diabetes mellitus with hyperglycemia: Secondary | ICD-10-CM | POA: Diagnosis not present

## 2023-02-19 DIAGNOSIS — B9689 Other specified bacterial agents as the cause of diseases classified elsewhere: Secondary | ICD-10-CM | POA: Diagnosis not present

## 2023-02-19 DIAGNOSIS — M47812 Spondylosis without myelopathy or radiculopathy, cervical region: Secondary | ICD-10-CM | POA: Diagnosis not present

## 2023-02-19 DIAGNOSIS — J9811 Atelectasis: Secondary | ICD-10-CM | POA: Diagnosis not present

## 2023-02-20 DIAGNOSIS — N3001 Acute cystitis with hematuria: Secondary | ICD-10-CM | POA: Insufficient documentation

## 2023-02-20 DIAGNOSIS — B379 Candidiasis, unspecified: Secondary | ICD-10-CM | POA: Insufficient documentation

## 2023-02-20 NOTE — Assessment & Plan Note (Signed)
Start Mounjaro to help better control glucose levels and assist with weight loss. Continue taking Lantus and Humulin as prescribed, and we may switch Humulin to a pen form for easier use. We will also initiate continuous glucose monitoring with Dexcom to better track your blood sugar levels.

## 2023-02-20 NOTE — Assessment & Plan Note (Signed)
The current medical regimen is effective;  continue present plan and medications.  

## 2023-02-20 NOTE — Assessment & Plan Note (Signed)
Previously well controlled Continue Synthroid at current dose  Recheck TSH and adjust Synthroid as indicated   

## 2023-02-20 NOTE — Assessment & Plan Note (Signed)
Improved

## 2023-02-20 NOTE — Assessment & Plan Note (Signed)
Well controlled.  No changes to medicines. Continue Valsartan/hydrochlorothiazide 320/25 mg once daily. Continue to work on eating a healthy diet and exercise.  Labs drawn today.

## 2023-02-20 NOTE — Assessment & Plan Note (Signed)
Management per specialist The current medical regimen is effective;  continue present plan and medications.  

## 2023-02-20 NOTE — Assessment & Plan Note (Signed)
 Check UA Order urine culture

## 2023-02-20 NOTE — Assessment & Plan Note (Signed)
Well controlled.  No medicines.  Continue to work on eating a healthy diet and exercise.  Labs drawn today.

## 2023-02-20 NOTE — Assessment & Plan Note (Signed)
Check levels 

## 2023-02-21 ENCOUNTER — Encounter: Payer: Self-pay | Admitting: Family Medicine

## 2023-02-21 DIAGNOSIS — I700 Atherosclerosis of aorta: Secondary | ICD-10-CM | POA: Insufficient documentation

## 2023-02-21 NOTE — Assessment & Plan Note (Signed)
Recommend start on repatha.

## 2023-02-22 ENCOUNTER — Ambulatory Visit (INDEPENDENT_AMBULATORY_CARE_PROVIDER_SITE_OTHER): Payer: Medicare Other | Admitting: Family Medicine

## 2023-02-22 ENCOUNTER — Ambulatory Visit: Payer: Self-pay | Admitting: Family Medicine

## 2023-02-22 VITALS — BP 110/62 | HR 84 | Temp 98.1°F | Resp 16 | Ht 68.0 in | Wt 278.4 lb

## 2023-02-22 DIAGNOSIS — M25551 Pain in right hip: Secondary | ICD-10-CM

## 2023-02-22 DIAGNOSIS — Z1211 Encounter for screening for malignant neoplasm of colon: Secondary | ICD-10-CM | POA: Diagnosis not present

## 2023-02-22 MED ORDER — MELOXICAM 15 MG PO TABS: 15.0000 mg | ORAL_TABLET | Freq: Every day | ORAL | 1 refills | Status: DC

## 2023-02-22 NOTE — Telephone Encounter (Signed)
  Chief Complaint: R hip pain Symptoms: R hip pain Frequency: constant x 3 days Pertinent Negatives: Patient denies numbness or tingling in leg or feet, back pain, loss of bowel or bladder Disposition: [] ED /[] Urgent Care (no appt availability in office) / [x] Appointment(In office/virtual)/ []  [facility]/ [] Home Care/ [] Refused Recommended Disposition /[] [facility]/ []  Follow-up with PCP Additional Notes: Pt calls stating that she picked up a child multiple times on 12/20 and woke up 12/21 with R hip pain. States it hurts when she attempts to cross L leg or when she tries to get into bed. Pain is relieved when resting. States she has been using heat and ice for pain relief. States pain is constant and non radiating, and 10/10 with movement. Pt uses cane and walker, which she feels helps her ambulate. States she is currently participating in PT and OT post hospitalization in November and she did try at home exercises with no relief. Per protocol, pt to be evaluated within 3 days. Next available with PCP 1/2 @ 1040. This RN was able to schedule pt with alternate provider in clinic today at 1040. Care advice reviewed, pt agreeable to plan. Alerting PCP for review.   Copied from CRM [id number]. Topic: Clinical - Red Word Triage >> Feb 22, 2023  8:14 AM Geroge Baseman wrote: Patient/patient representative is calling to schedule an appointment. Refer to attachments for appointment information. Reason for Disposition  [1] MODERATE pain (e.g., interferes with normal activities, limping) AND [2] present > 3 days  Answer Assessment - Initial Assessment Questions 1. LOCATION and RADIATION: "Where is the pain located?"      R hip 2. QUALITY: "What does the pain feel like?"  (e.g., sharp, dull, aching, burning)     Sharp with movement, feels like pinched nerve 3. SEVERITY: "How bad is the pain?" "What does it keep you from doing?"   (Scale 1-10; or mild, moderate, severe)   -  MILD (1-3):  doesn't interfere with normal activities    -  MODERATE (4-7): interferes with normal activities (e.g., work or school) or awakens from sleep, limping    -  SEVERE (8-10): excruciating pain, unable to do any normal activities, unable to walk     10/10 at times- with movement 4. ONSET: "When did the pain start?" "Does it come and go, or is it there all the time?"     3 days ago, comes and goes 5. WORK OR EXERCISE: "Has there been any recent work or exercise that involved this part of the body?"      Lifted child on Friday 6. CAUSE: "What do you think is causing the hip pain?"      Pinched nerve 7. AGGRAVATING FACTORS: "What makes the hip pain worse?" (e.g., walking, climbing stairs, running)     Sitting and crossing L leg, causes pain on R 8. OTHER SYMPTOMS: "Do you have any other symptoms?" (e.g., back pain, pain shooting down leg,  fever, rash)     denies  Protocols used: Hip Pain-A-AH

## 2023-02-22 NOTE — Progress Notes (Unsigned)
Acute Office Visit  Subjective:    Patient ID: Whitney Eaton, female    DOB: 16-Nov-1950, 72 y.o.   MRN: [id number]  No chief complaint on file.   Discussed the use of AI scribe software for clinical note transcription with the patient, who gave verbal consent to proceed.   HPI: Patient is in today for Rt hip pain and can radiate up tylenol has not helped. Sleeping in recliner.   Past Medical History:  Diagnosis Date   Asthma    Diabetes (HCC)    Hyperlipemia    Hypertension    Psoriasis    Sleep apnea    Thyroid disease     Past Surgical History:  Procedure Laterality Date   ABDOMINAL HYSTERECTOMY     CHOLECYSTECTOMY  2011   HYSTERECTOMY ABDOMINAL WITH SALPINGECTOMY     OOPHORECTOMY  1980    Family History  Problem Relation Age of Onset   Alzheimer's disease Mother    Breast cancer Mother    Arthritis Mother    Diabetes Mother    Eczema Father    CAD Father    Diabetes Father    Parkinson's disease Father    Breast cancer Sister    Neurologic Disorder Brother    Glaucoma Maternal Grandmother    Heart disease Other     Social History   Socioeconomic History   Marital status: Married    Spouse name: Not on file   Number of children: 2   Years of education: Not on file   Highest education level: GED or equivalent  Occupational History   Occupation: homemaker  Tobacco Use   Smoking status: Never   Smokeless tobacco: Never  Vaping Use   Vaping status: Never Used  Substance and Sexual Activity   Alcohol use: Never   Drug use: Never   Sexual activity: Not on file  Other Topics Concern   Not on file  Social History Narrative   Not on file   Social Drivers of Health   Financial Resource Strain: Low Risk  (02/14/2023)   Overall Financial Resource Strain (CARDIA)    Difficulty of Paying Living Expenses: Not hard at all  Food Insecurity: No Food Insecurity (02/14/2023)   Hunger Vital Sign    Worried About Running Out of Food in the Last Year: Never  true    Ran Out of Food in the Last Year: Never true  Transportation Needs: No Transportation Needs (02/14/2023)   PRAPARE - Administrator, Civil Service (Medical): No    Lack of Transportation (Non-Medical): No  Physical Activity: Insufficiently Active (02/14/2023)   Exercise Vital Sign    Days of Exercise per Week: 3 days    Minutes of Exercise per Session: 10 min  Stress: No Stress Concern Present (02/14/2023)   [employer] of Occupational Health - Occupational Stress Questionnaire    Feeling of Stress : Only a little  Social Connections: Unknown (02/14/2023)   Social Connection and Isolation Panel [NHANES]    Frequency of Communication with Friends and Family: Three times a week    Frequency of Social Gatherings with Friends and Family: Once a week    Attends Religious Services: Patient declined    Active Member of Clubs or Organizations: Yes    Attends Banker Meetings: Patient declined    Marital Status: Married  Catering manager Violence: Not At Risk (02/08/2023)   Humiliation, Afraid, Rape, and Kick questionnaire    Fear of Current or Ex-Partner:  No    Emotionally Abused: No    Physically Abused: No    Sexually Abused: No    Outpatient Medications Prior to Visit  Medication Sig Dispense Refill   Accu-Chek Softclix Lancets lancets CHECK FASTING BLOOD SUGAR ONCE DAILY 100 each 3   BD VEO INSULIN SYRINGE U/F 31G X 15/64" 0.3 ML MISC as directed 100 each 11   blood glucose meter kit and supplies KIT Check sugars fasting. E11.69 1 each 0   DROPLET PEN NEEDLES 31G X 5 MM MISC use as directed 100 each 2   glucose blood (ACCU-CHEK GUIDE TEST) test strip Use as instructed 100 each 12   Insulin NPH, Human,, Isophane, (HUMULIN N KWIKPEN) 100 UNIT/ML Kiwkpen SSI with max of 30 U total daily for preprandial use. 15 mL 11   LANTUS SOLOSTAR 100 UNIT/ML Solostar Pen Inject 35 Units into the skin daily. 15 mL 1   levothyroxine (SYNTHROID) 100 MCG tablet Take  1 tablet (100 mcg total) by mouth daily. 90 tablet 3   potassium chloride (KLOR-CON M) 10 MEQ tablet Take 1 tablet (10 mEq total) by mouth 2 (two) times daily. 180 tablet 0   valsartan-hydrochlorothiazide (DIOVAN-HCT) 320-25 MG tablet Take 1 tablet by mouth daily. 90 tablet 1   Vitamin D, Ergocalciferol, (DRISDOL) 1.25 MG (50000 UNIT) CAPS capsule Take 1 capsule (50,000 Units total) by mouth every 7 (seven) days. 12 capsule 0   zolpidem (AMBIEN) 10 MG tablet TAKE 1 TABLET(10 MG) BY MOUTH AT BEDTIME 30 tablet 5   tirzepatide (MOUNJARO) 2.5 MG/0.5ML Pen Inject 2.5 mg into the skin once a week. (Patient not taking: Reported on 02/22/2023) 2 mL 2   No facility-administered medications prior to visit.    Allergies  Allergen Reactions   Metformin And Related Diarrhea   Crestor [Rosuvastatin]     myalgia   Fenofibrate     myalgia   Jardiance [Empagliflozin]     Nausea.   Lipitor [Atorvastatin]     myalgia    Review of Systems     Objective:        02/22/2023   11:00 AM 02/15/2023    8:15 AM 02/08/2023    2:31 PM  Vitals with BMI  Height 5\' 8"  5\' 8"  5\' 6"   Weight 278 lbs 6 oz 274 lbs 289 lbs  BMI 42.34 41.67 46.67  Systolic 110 132 --  Diastolic 62 78 --  Pulse 84 79     Orthostatic VS for the past 72 hrs (Last 3 readings):  Patient Position BP Location Cuff Size  02/22/23 1100 Sitting Left Arm Large     Physical Exam  Health Maintenance Due  Topic Date Due   DTaP/Tdap/Td (1 - Tdap) Never done   MAMMOGRAM  05/27/2021   Colonoscopy  06/24/2021   OPHTHALMOLOGY EXAM  09/04/2021    There are no preventive care reminders to display for this patient.   Lab Results  Component Value Date   TSH 15.700 (H) 02/15/2023   Lab Results  Component Value Date   WBC 9.7 02/15/2023   HGB 13.4 02/15/2023   HCT 41.0 02/15/2023   MCV 95 02/15/2023   PLT 288 02/15/2023   Lab Results  Component Value Date   NA 135 02/15/2023   K 4.2 02/15/2023   CO2 24 02/15/2023    GLUCOSE 174 (H) 02/15/2023   BUN 18 02/15/2023   CREATININE 0.87 02/15/2023   BILITOT 0.7 02/15/2023   ALKPHOS 94 02/15/2023   AST 42 (H)  02/15/2023   ALT 41 (H) 02/15/2023   PROT 7.2 02/15/2023   ALBUMIN 4.2 02/15/2023   CALCIUM 10.5 (H) 02/15/2023   EGFR 71 02/15/2023   Lab Results  Component Value Date   CHOL 218 (H) 02/15/2023   Lab Results  Component Value Date   HDL 38 (L) 02/15/2023   Lab Results  Component Value Date   LDLCALC 138 (H) 02/15/2023   Lab Results  Component Value Date   TRIG 232 (H) 02/15/2023   Lab Results  Component Value Date   CHOLHDL 5.7 (H) 02/15/2023   Lab Results  Component Value Date   HGBA1C 12.7 (H) 02/15/2023       Assessment & Plan:  There are no diagnoses linked to this encounter.   No orders of the defined types were placed in this encounter.   No orders of the defined types were placed in this encounter.    Follow-up: No follow-ups on file.  An After Visit Summary was printed and given to the patient.  Blane Ohara, MD [facility] [phone]

## 2023-02-23 DIAGNOSIS — E1165 Type 2 diabetes mellitus with hyperglycemia: Secondary | ICD-10-CM | POA: Diagnosis not present

## 2023-02-23 DIAGNOSIS — G4733 Obstructive sleep apnea (adult) (pediatric): Secondary | ICD-10-CM | POA: Diagnosis not present

## 2023-02-23 DIAGNOSIS — G4700 Insomnia, unspecified: Secondary | ICD-10-CM | POA: Diagnosis not present

## 2023-02-23 DIAGNOSIS — I21A1 Myocardial infarction type 2: Secondary | ICD-10-CM | POA: Diagnosis not present

## 2023-02-23 DIAGNOSIS — M1610 Unilateral primary osteoarthritis, unspecified hip: Secondary | ICD-10-CM | POA: Diagnosis not present

## 2023-02-23 DIAGNOSIS — B9689 Other specified bacterial agents as the cause of diseases classified elsewhere: Secondary | ICD-10-CM | POA: Diagnosis not present

## 2023-02-23 DIAGNOSIS — M47812 Spondylosis without myelopathy or radiculopathy, cervical region: Secondary | ICD-10-CM | POA: Diagnosis not present

## 2023-02-23 DIAGNOSIS — Z792 Long term (current) use of antibiotics: Secondary | ICD-10-CM | POA: Diagnosis not present

## 2023-02-23 DIAGNOSIS — J019 Acute sinusitis, unspecified: Secondary | ICD-10-CM | POA: Diagnosis not present

## 2023-02-23 DIAGNOSIS — I10 Essential (primary) hypertension: Secondary | ICD-10-CM | POA: Diagnosis not present

## 2023-02-23 DIAGNOSIS — E039 Hypothyroidism, unspecified: Secondary | ICD-10-CM | POA: Diagnosis not present

## 2023-02-23 DIAGNOSIS — M1712 Unilateral primary osteoarthritis, left knee: Secondary | ICD-10-CM | POA: Diagnosis not present

## 2023-02-23 DIAGNOSIS — E876 Hypokalemia: Secondary | ICD-10-CM | POA: Diagnosis not present

## 2023-02-23 DIAGNOSIS — Z794 Long term (current) use of insulin: Secondary | ICD-10-CM | POA: Diagnosis not present

## 2023-02-23 DIAGNOSIS — J9811 Atelectasis: Secondary | ICD-10-CM | POA: Diagnosis not present

## 2023-02-23 DIAGNOSIS — E872 Acidosis, unspecified: Secondary | ICD-10-CM | POA: Diagnosis not present

## 2023-02-23 DIAGNOSIS — Z9181 History of falling: Secondary | ICD-10-CM | POA: Diagnosis not present

## 2023-02-23 DIAGNOSIS — M25551 Pain in right hip: Secondary | ICD-10-CM | POA: Insufficient documentation

## 2023-02-23 NOTE — Assessment & Plan Note (Signed)
Restart Meloxicam 15 mg daily.

## 2023-02-24 ENCOUNTER — Encounter: Payer: Self-pay | Admitting: Family Medicine

## 2023-02-25 ENCOUNTER — Telehealth: Payer: Self-pay | Admitting: Family Medicine

## 2023-02-25 DIAGNOSIS — G4733 Obstructive sleep apnea (adult) (pediatric): Secondary | ICD-10-CM | POA: Diagnosis not present

## 2023-02-25 DIAGNOSIS — B9689 Other specified bacterial agents as the cause of diseases classified elsewhere: Secondary | ICD-10-CM | POA: Diagnosis not present

## 2023-02-25 DIAGNOSIS — M1610 Unilateral primary osteoarthritis, unspecified hip: Secondary | ICD-10-CM | POA: Diagnosis not present

## 2023-02-25 DIAGNOSIS — E039 Hypothyroidism, unspecified: Secondary | ICD-10-CM | POA: Diagnosis not present

## 2023-02-25 DIAGNOSIS — J9811 Atelectasis: Secondary | ICD-10-CM | POA: Diagnosis not present

## 2023-02-25 DIAGNOSIS — Z9181 History of falling: Secondary | ICD-10-CM | POA: Diagnosis not present

## 2023-02-25 DIAGNOSIS — J019 Acute sinusitis, unspecified: Secondary | ICD-10-CM | POA: Diagnosis not present

## 2023-02-25 DIAGNOSIS — I21A1 Myocardial infarction type 2: Secondary | ICD-10-CM | POA: Diagnosis not present

## 2023-02-25 DIAGNOSIS — E1165 Type 2 diabetes mellitus with hyperglycemia: Secondary | ICD-10-CM | POA: Diagnosis not present

## 2023-02-25 DIAGNOSIS — G4700 Insomnia, unspecified: Secondary | ICD-10-CM | POA: Diagnosis not present

## 2023-02-25 DIAGNOSIS — Z792 Long term (current) use of antibiotics: Secondary | ICD-10-CM | POA: Diagnosis not present

## 2023-02-25 DIAGNOSIS — E876 Hypokalemia: Secondary | ICD-10-CM | POA: Diagnosis not present

## 2023-02-25 DIAGNOSIS — E872 Acidosis, unspecified: Secondary | ICD-10-CM | POA: Diagnosis not present

## 2023-02-25 DIAGNOSIS — I10 Essential (primary) hypertension: Secondary | ICD-10-CM | POA: Diagnosis not present

## 2023-02-25 DIAGNOSIS — Z794 Long term (current) use of insulin: Secondary | ICD-10-CM | POA: Diagnosis not present

## 2023-02-25 DIAGNOSIS — M1712 Unilateral primary osteoarthritis, left knee: Secondary | ICD-10-CM | POA: Diagnosis not present

## 2023-02-25 DIAGNOSIS — M47812 Spondylosis without myelopathy or radiculopathy, cervical region: Secondary | ICD-10-CM | POA: Diagnosis not present

## 2023-02-25 NOTE — Telephone Encounter (Signed)
[facility] 01/22/23 TO 03/07/23

## 2023-02-26 DIAGNOSIS — M1712 Unilateral primary osteoarthritis, left knee: Secondary | ICD-10-CM | POA: Diagnosis not present

## 2023-02-26 DIAGNOSIS — E872 Acidosis, unspecified: Secondary | ICD-10-CM | POA: Diagnosis not present

## 2023-02-26 DIAGNOSIS — E876 Hypokalemia: Secondary | ICD-10-CM | POA: Diagnosis not present

## 2023-02-26 DIAGNOSIS — J019 Acute sinusitis, unspecified: Secondary | ICD-10-CM | POA: Diagnosis not present

## 2023-02-26 DIAGNOSIS — Z794 Long term (current) use of insulin: Secondary | ICD-10-CM | POA: Diagnosis not present

## 2023-02-26 DIAGNOSIS — B9689 Other specified bacterial agents as the cause of diseases classified elsewhere: Secondary | ICD-10-CM | POA: Diagnosis not present

## 2023-02-26 DIAGNOSIS — I10 Essential (primary) hypertension: Secondary | ICD-10-CM | POA: Diagnosis not present

## 2023-02-26 DIAGNOSIS — G4733 Obstructive sleep apnea (adult) (pediatric): Secondary | ICD-10-CM | POA: Diagnosis not present

## 2023-02-26 DIAGNOSIS — E1165 Type 2 diabetes mellitus with hyperglycemia: Secondary | ICD-10-CM | POA: Diagnosis not present

## 2023-02-26 DIAGNOSIS — I21A1 Myocardial infarction type 2: Secondary | ICD-10-CM | POA: Diagnosis not present

## 2023-02-26 DIAGNOSIS — Z9181 History of falling: Secondary | ICD-10-CM | POA: Diagnosis not present

## 2023-02-26 DIAGNOSIS — M47812 Spondylosis without myelopathy or radiculopathy, cervical region: Secondary | ICD-10-CM | POA: Diagnosis not present

## 2023-02-26 DIAGNOSIS — Z792 Long term (current) use of antibiotics: Secondary | ICD-10-CM | POA: Diagnosis not present

## 2023-02-26 DIAGNOSIS — G4700 Insomnia, unspecified: Secondary | ICD-10-CM | POA: Diagnosis not present

## 2023-02-26 DIAGNOSIS — E039 Hypothyroidism, unspecified: Secondary | ICD-10-CM | POA: Diagnosis not present

## 2023-02-26 DIAGNOSIS — J9811 Atelectasis: Secondary | ICD-10-CM | POA: Diagnosis not present

## 2023-02-26 DIAGNOSIS — M1610 Unilateral primary osteoarthritis, unspecified hip: Secondary | ICD-10-CM | POA: Diagnosis not present

## 2023-03-01 DIAGNOSIS — E039 Hypothyroidism, unspecified: Secondary | ICD-10-CM | POA: Diagnosis not present

## 2023-03-01 DIAGNOSIS — J9811 Atelectasis: Secondary | ICD-10-CM | POA: Diagnosis not present

## 2023-03-01 DIAGNOSIS — I10 Essential (primary) hypertension: Secondary | ICD-10-CM | POA: Diagnosis not present

## 2023-03-01 DIAGNOSIS — M1712 Unilateral primary osteoarthritis, left knee: Secondary | ICD-10-CM | POA: Diagnosis not present

## 2023-03-01 DIAGNOSIS — M47812 Spondylosis without myelopathy or radiculopathy, cervical region: Secondary | ICD-10-CM | POA: Diagnosis not present

## 2023-03-01 DIAGNOSIS — E876 Hypokalemia: Secondary | ICD-10-CM | POA: Diagnosis not present

## 2023-03-01 DIAGNOSIS — Z794 Long term (current) use of insulin: Secondary | ICD-10-CM | POA: Diagnosis not present

## 2023-03-01 DIAGNOSIS — E1165 Type 2 diabetes mellitus with hyperglycemia: Secondary | ICD-10-CM | POA: Diagnosis not present

## 2023-03-01 DIAGNOSIS — Z792 Long term (current) use of antibiotics: Secondary | ICD-10-CM | POA: Diagnosis not present

## 2023-03-01 DIAGNOSIS — E872 Acidosis, unspecified: Secondary | ICD-10-CM | POA: Diagnosis not present

## 2023-03-01 DIAGNOSIS — G4700 Insomnia, unspecified: Secondary | ICD-10-CM | POA: Diagnosis not present

## 2023-03-01 DIAGNOSIS — Z9181 History of falling: Secondary | ICD-10-CM | POA: Diagnosis not present

## 2023-03-01 DIAGNOSIS — B9689 Other specified bacterial agents as the cause of diseases classified elsewhere: Secondary | ICD-10-CM | POA: Diagnosis not present

## 2023-03-01 DIAGNOSIS — J019 Acute sinusitis, unspecified: Secondary | ICD-10-CM | POA: Diagnosis not present

## 2023-03-01 DIAGNOSIS — M1610 Unilateral primary osteoarthritis, unspecified hip: Secondary | ICD-10-CM | POA: Diagnosis not present

## 2023-03-01 DIAGNOSIS — G4733 Obstructive sleep apnea (adult) (pediatric): Secondary | ICD-10-CM | POA: Diagnosis not present

## 2023-03-01 DIAGNOSIS — I21A1 Myocardial infarction type 2: Secondary | ICD-10-CM | POA: Diagnosis not present

## 2023-03-02 ENCOUNTER — Encounter: Payer: Self-pay | Admitting: Family Medicine

## 2023-03-02 ENCOUNTER — Ambulatory Visit: Payer: Self-pay | Admitting: Family Medicine

## 2023-03-02 DIAGNOSIS — E876 Hypokalemia: Secondary | ICD-10-CM | POA: Diagnosis not present

## 2023-03-02 DIAGNOSIS — E1165 Type 2 diabetes mellitus with hyperglycemia: Secondary | ICD-10-CM | POA: Diagnosis not present

## 2023-03-02 DIAGNOSIS — M47812 Spondylosis without myelopathy or radiculopathy, cervical region: Secondary | ICD-10-CM | POA: Diagnosis not present

## 2023-03-02 DIAGNOSIS — Z794 Long term (current) use of insulin: Secondary | ICD-10-CM | POA: Diagnosis not present

## 2023-03-02 DIAGNOSIS — I10 Essential (primary) hypertension: Secondary | ICD-10-CM | POA: Diagnosis not present

## 2023-03-02 DIAGNOSIS — E039 Hypothyroidism, unspecified: Secondary | ICD-10-CM | POA: Diagnosis not present

## 2023-03-02 DIAGNOSIS — E872 Acidosis, unspecified: Secondary | ICD-10-CM | POA: Diagnosis not present

## 2023-03-02 DIAGNOSIS — J9811 Atelectasis: Secondary | ICD-10-CM | POA: Diagnosis not present

## 2023-03-02 DIAGNOSIS — G4700 Insomnia, unspecified: Secondary | ICD-10-CM | POA: Diagnosis not present

## 2023-03-02 DIAGNOSIS — G4733 Obstructive sleep apnea (adult) (pediatric): Secondary | ICD-10-CM | POA: Diagnosis not present

## 2023-03-02 DIAGNOSIS — B9689 Other specified bacterial agents as the cause of diseases classified elsewhere: Secondary | ICD-10-CM | POA: Diagnosis not present

## 2023-03-02 DIAGNOSIS — J019 Acute sinusitis, unspecified: Secondary | ICD-10-CM | POA: Diagnosis not present

## 2023-03-02 DIAGNOSIS — Z792 Long term (current) use of antibiotics: Secondary | ICD-10-CM | POA: Diagnosis not present

## 2023-03-02 DIAGNOSIS — I21A1 Myocardial infarction type 2: Secondary | ICD-10-CM | POA: Diagnosis not present

## 2023-03-02 DIAGNOSIS — Z9181 History of falling: Secondary | ICD-10-CM | POA: Diagnosis not present

## 2023-03-02 DIAGNOSIS — M1712 Unilateral primary osteoarthritis, left knee: Secondary | ICD-10-CM | POA: Diagnosis not present

## 2023-03-02 DIAGNOSIS — M1610 Unilateral primary osteoarthritis, unspecified hip: Secondary | ICD-10-CM | POA: Diagnosis not present

## 2023-03-02 NOTE — Telephone Encounter (Signed)
  Chief Complaint: medication question  Additional Notes: [facility] home health RN, Nat calling asking to clarify that patient's Humulin  R has been replaced by Humulin  LOISE Purpura. She is concerned due to them being different types of insulin , she would like the PCP to confirm this change is correct. She states the patient can continue her previous regimen (Lantus , Mounjaro  and Humulin  R) until they receive confirmation. Pt and RN aware office is closed today and tomorrow.   Copied from CRM [id number]. Topic: Clinical - Medication Question >> Mar 02, 2023 12:33 PM Chase C wrote: Reason for CRM: [facility] called with questions regarding the administration of the medication Insulin  NPH, Human,, Isophane, (HUMULIN  N KWIKPEN) 100 UNIT/ML Kiwkpen. Transferred to Nurse Triage Line. Reason for Disposition  [1] Caller has NON-URGENT medicine question about med that PCP prescribed AND [2] triager unable to answer question  Answer Assessment - Initial Assessment Questions 1. NAME of MEDICINE: What medicine(s) are you calling about?     Humulin  N Kwikpen  2. QUESTION: What is your question? (e.g., double dose of medicine, side effect)     Home health RN, Nat calling to confirm patient is on correct medication. Humulin  N Kwikpen is replacing Humulin  R. They would like to confirm this change. Pt is also still taking Lantus  and Mounjaro .  3. PRESCRIBER: Who prescribed the medicine? Reason: if prescribed by specialist, call should be referred to that group.     Dr. Sherre.  4. SYMPTOMS: Do you have any symptoms? If Yes, ask: What symptoms are you having?  How bad are the symptoms (e.g., mild, moderate, severe)     Asymptomatic and RN states home BG has been stable and improving.  Protocols used: Medication Question Call-A-AH

## 2023-03-04 ENCOUNTER — Other Ambulatory Visit: Payer: Self-pay

## 2023-03-04 ENCOUNTER — Other Ambulatory Visit: Payer: Self-pay | Admitting: Family Medicine

## 2023-03-04 DIAGNOSIS — E1165 Type 2 diabetes mellitus with hyperglycemia: Secondary | ICD-10-CM

## 2023-03-04 MED ORDER — HUMALOG KWIKPEN 200 UNIT/ML ~~LOC~~ SOPN: PEN_INJECTOR | SUBCUTANEOUS | 1 refills | Status: DC

## 2023-03-04 NOTE — Telephone Encounter (Signed)
 Patient made aware, verbalized understanding. And also [facility] due to patient insurance change.

## 2023-03-06 IMAGING — MG MM DIGITAL SCREENING BILAT W/ TOMO AND CAD
8 series · 8 of 24 positions shown · non-contrast
Comparison: Previous exam(s).

CLINICAL DATA: Screening.

EXAM:
DIGITAL SCREENING BILATERAL MAMMOGRAM WITH TOMOSYNTHESIS AND CAD
TECHNIQUE: Bilateral screening digital craniocaudal and mediolateral oblique
mammograms were obtained. Bilateral screening digital breast
tomosynthesis was performed. The images were evaluated with
computer-aided detection.

[R CC synth-2D]
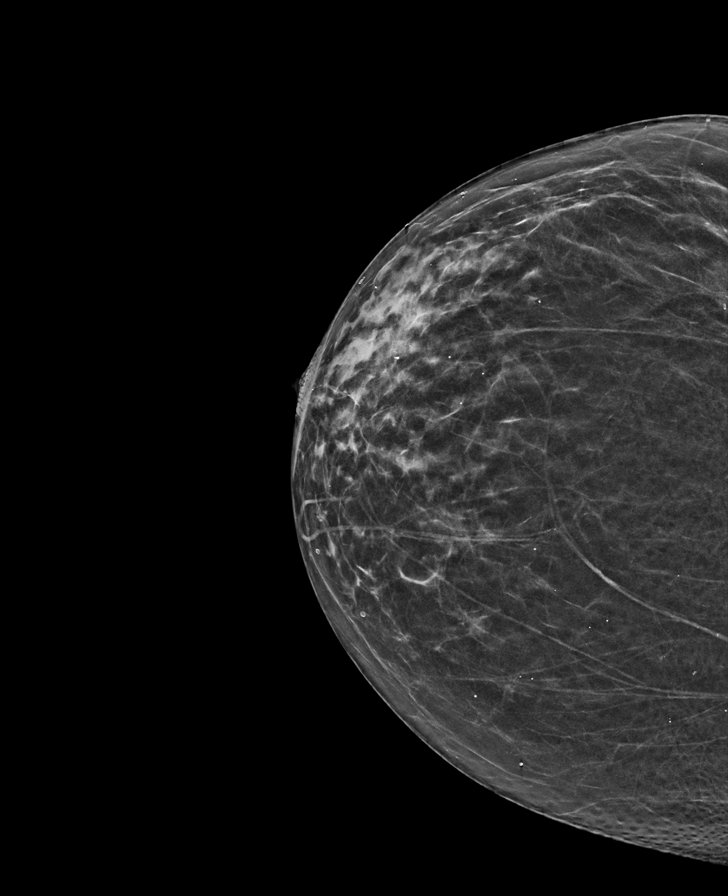

[L MLO synth-2D]
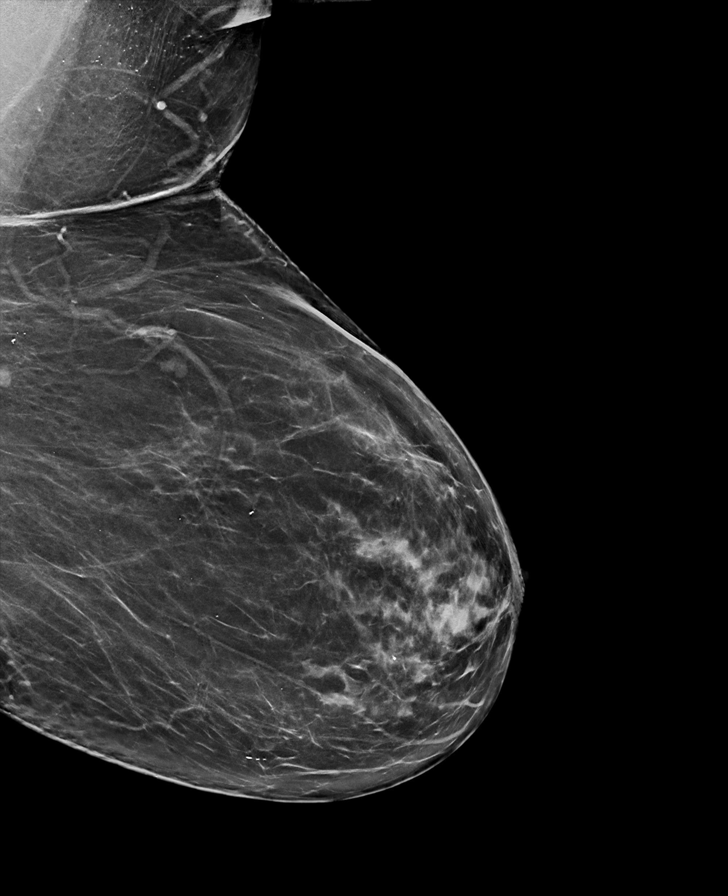

[L CC synth-2D]
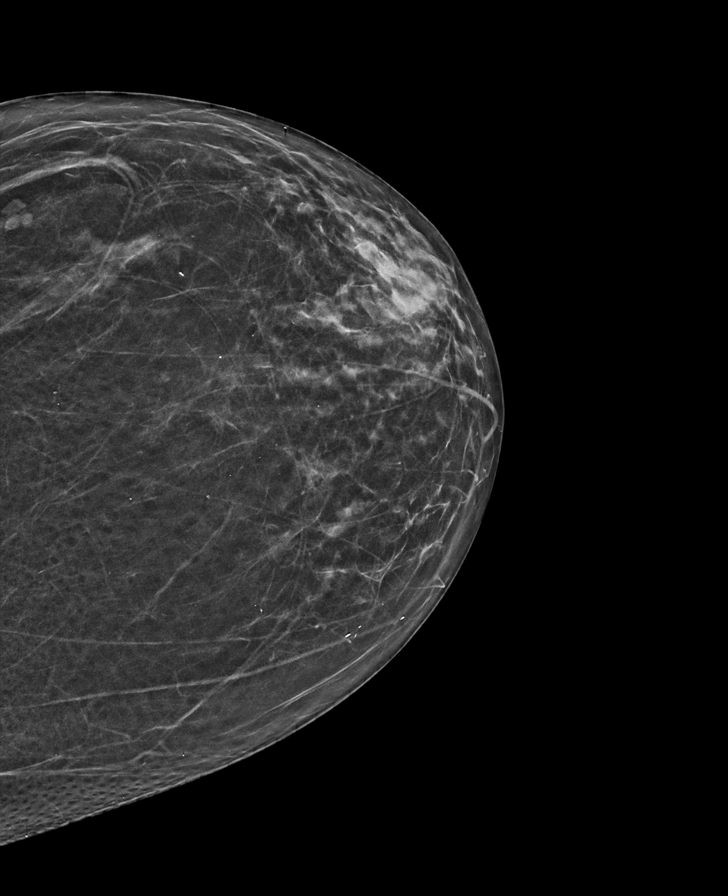

[R MLO synth-2D]
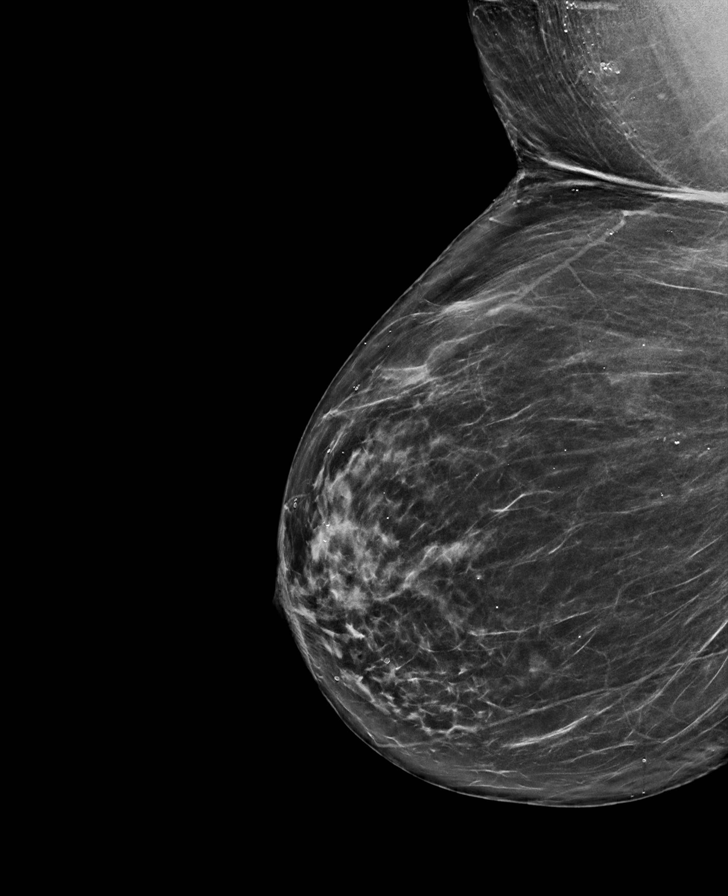

[R MLO tomo · tomo slice 35/69.0]
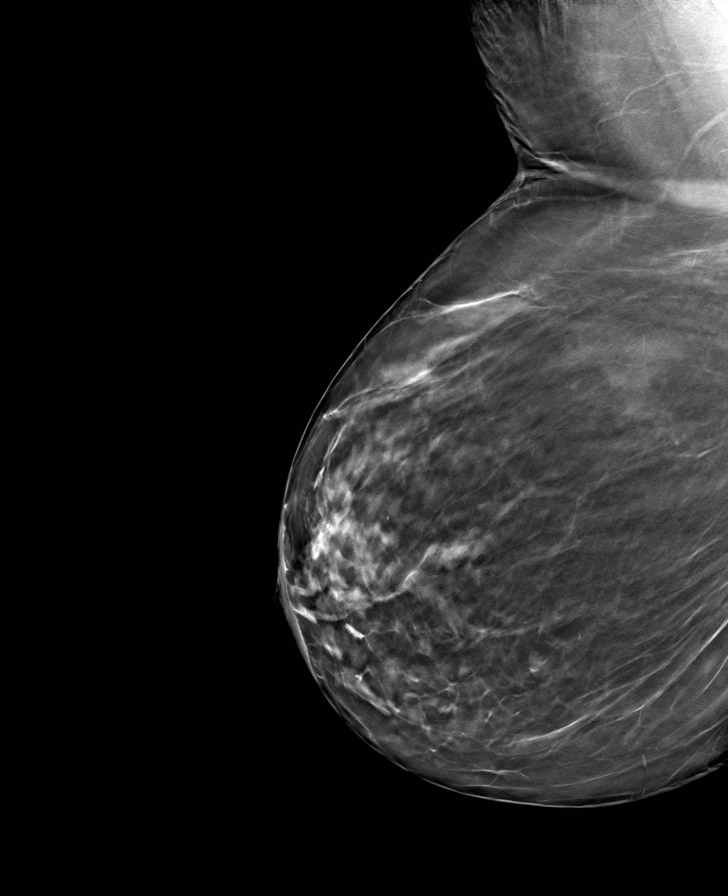

[R CC tomo · tomo slice 27/54.0]
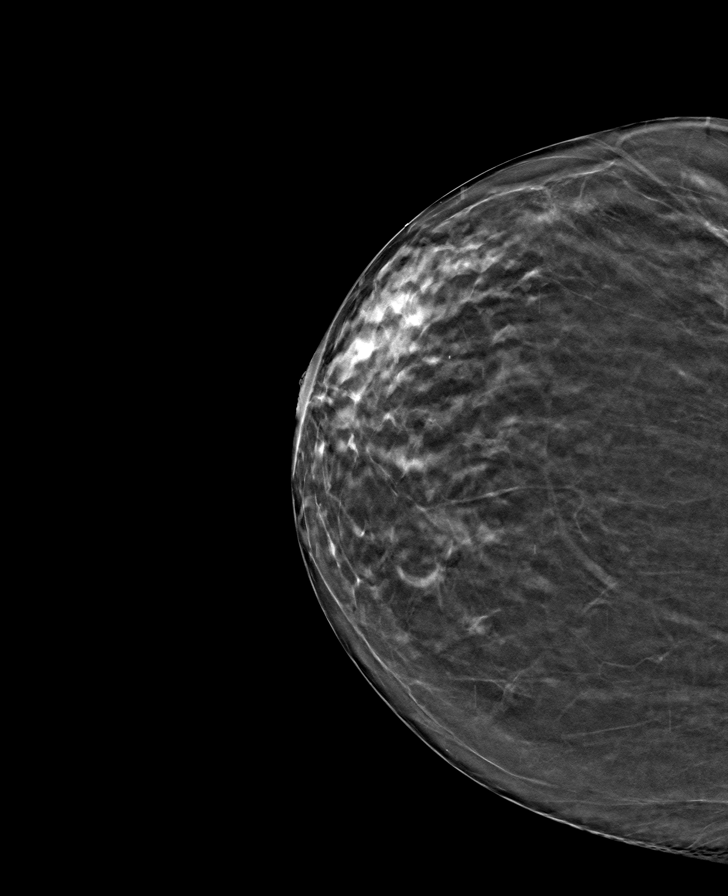

[L CC tomo · tomo slice 27/53.0]
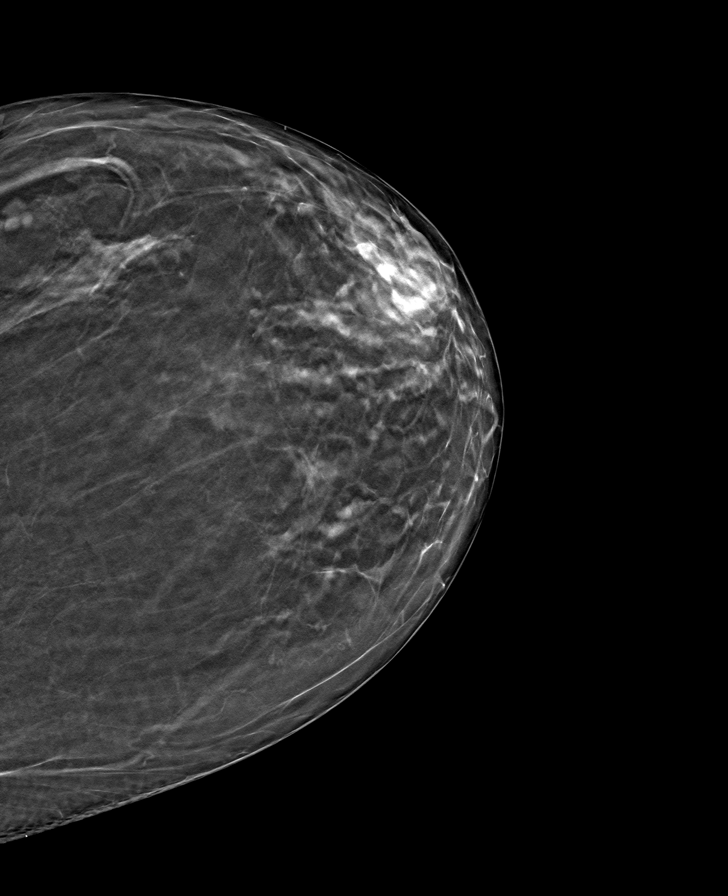

[L MLO tomo · tomo slice 39/78.0]
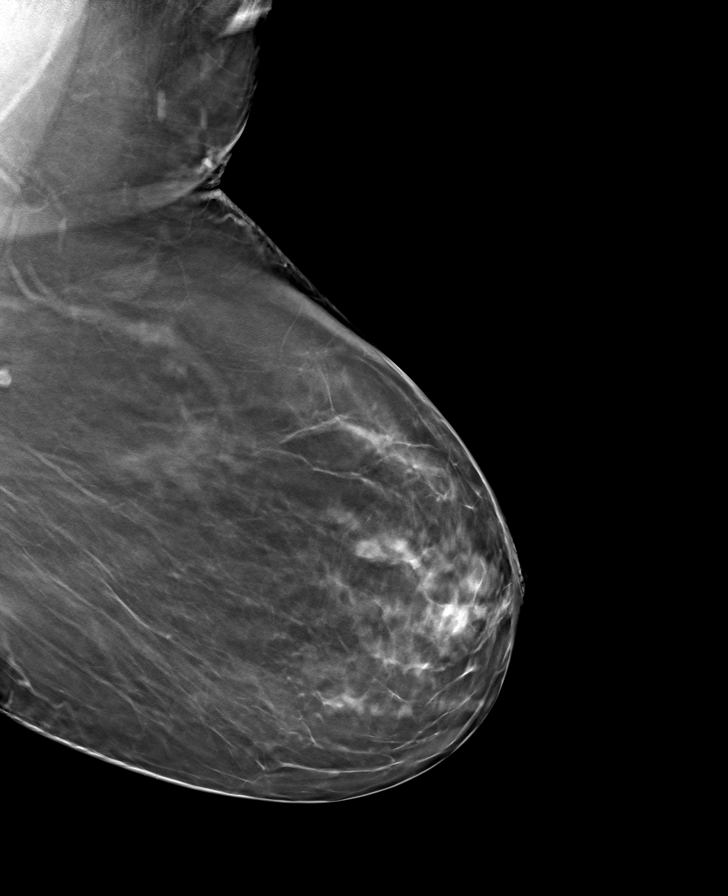

[8 of 24 positions shown; findings below may reference images not displayed]

ACR Breast Density Category c: The breast tissue is heterogeneously
dense, which may obscure small masses.
FINDINGS: There are no findings suspicious for malignancy. The images were
evaluated with computer-aided detection.
IMPRESSION: No mammographic evidence of malignancy. A result letter of this
screening mammogram will be mailed directly to the patient.

RECOMMENDATION:
Screening mammogram in one year. (Code:T4-5-GWO)

BI-RADS CATEGORY  1: Negative.

## 2023-03-08 ENCOUNTER — Other Ambulatory Visit: Payer: Self-pay

## 2023-03-08 ENCOUNTER — Telehealth: Payer: Self-pay

## 2023-03-08 DIAGNOSIS — E038 Other specified hypothyroidism: Secondary | ICD-10-CM

## 2023-03-08 DIAGNOSIS — E876 Hypokalemia: Secondary | ICD-10-CM

## 2023-03-08 DIAGNOSIS — E559 Vitamin D deficiency, unspecified: Secondary | ICD-10-CM

## 2023-03-08 DIAGNOSIS — I152 Hypertension secondary to endocrine disorders: Secondary | ICD-10-CM

## 2023-03-08 MED ORDER — VITAMIN D (ERGOCALCIFEROL) 1.25 MG (50000 UNIT) PO CAPS: 50000.0000 [IU] | ORAL_CAPSULE | ORAL | 0 refills | Status: DC

## 2023-03-08 MED ORDER — ZOLPIDEM TARTRATE 10 MG PO TABS: ORAL_TABLET | ORAL | 5 refills | Status: DC

## 2023-03-08 MED ORDER — POTASSIUM CHLORIDE CRYS ER 10 MEQ PO TBCR: 10.0000 meq | EXTENDED_RELEASE_TABLET | Freq: Two times a day (BID) | ORAL | 0 refills | Status: DC

## 2023-03-08 MED ORDER — ZOLPIDEM TARTRATE 10 MG PO TABS: ORAL_TABLET | ORAL | 2 refills | Status: DC

## 2023-03-08 MED ORDER — VALSARTAN-HYDROCHLOROTHIAZIDE 320-25 MG PO TABS: 1.0000 | ORAL_TABLET | Freq: Every day | ORAL | 1 refills | Status: DC

## 2023-03-08 NOTE — Telephone Encounter (Signed)
 This was put in dr. Sedalia Muta box

## 2023-03-09 ENCOUNTER — Telehealth: Payer: Self-pay

## 2023-03-09 DIAGNOSIS — Z794 Long term (current) use of insulin: Secondary | ICD-10-CM | POA: Diagnosis not present

## 2023-03-09 DIAGNOSIS — G4700 Insomnia, unspecified: Secondary | ICD-10-CM | POA: Diagnosis not present

## 2023-03-09 DIAGNOSIS — E1165 Type 2 diabetes mellitus with hyperglycemia: Secondary | ICD-10-CM | POA: Diagnosis not present

## 2023-03-09 DIAGNOSIS — Z792 Long term (current) use of antibiotics: Secondary | ICD-10-CM | POA: Diagnosis not present

## 2023-03-09 DIAGNOSIS — E876 Hypokalemia: Secondary | ICD-10-CM | POA: Diagnosis not present

## 2023-03-09 DIAGNOSIS — I10 Essential (primary) hypertension: Secondary | ICD-10-CM | POA: Diagnosis not present

## 2023-03-09 DIAGNOSIS — Z9181 History of falling: Secondary | ICD-10-CM | POA: Diagnosis not present

## 2023-03-09 DIAGNOSIS — G4733 Obstructive sleep apnea (adult) (pediatric): Secondary | ICD-10-CM | POA: Diagnosis not present

## 2023-03-09 DIAGNOSIS — I21A1 Myocardial infarction type 2: Secondary | ICD-10-CM | POA: Diagnosis not present

## 2023-03-09 DIAGNOSIS — B9689 Other specified bacterial agents as the cause of diseases classified elsewhere: Secondary | ICD-10-CM | POA: Diagnosis not present

## 2023-03-09 DIAGNOSIS — E872 Acidosis, unspecified: Secondary | ICD-10-CM | POA: Diagnosis not present

## 2023-03-09 DIAGNOSIS — E039 Hypothyroidism, unspecified: Secondary | ICD-10-CM | POA: Diagnosis not present

## 2023-03-09 DIAGNOSIS — Z6841 Body Mass Index (BMI) 40.0 and over, adult: Secondary | ICD-10-CM | POA: Diagnosis not present

## 2023-03-09 DIAGNOSIS — M47812 Spondylosis without myelopathy or radiculopathy, cervical region: Secondary | ICD-10-CM | POA: Diagnosis not present

## 2023-03-09 DIAGNOSIS — J019 Acute sinusitis, unspecified: Secondary | ICD-10-CM | POA: Diagnosis not present

## 2023-03-09 DIAGNOSIS — J9811 Atelectasis: Secondary | ICD-10-CM | POA: Diagnosis not present

## 2023-03-09 DIAGNOSIS — M1712 Unilateral primary osteoarthritis, left knee: Secondary | ICD-10-CM | POA: Diagnosis not present

## 2023-03-09 DIAGNOSIS — M1610 Unilateral primary osteoarthritis, unspecified hip: Secondary | ICD-10-CM | POA: Diagnosis not present

## 2023-03-09 NOTE — Telephone Encounter (Signed)
 Verbal given

## 2023-03-09 NOTE — Telephone Encounter (Signed)
 Copied from CRM [id number]. Topic: Clinical - Home Health Verbal Orders >> Mar 09, 2023  4:20 PM Antonio DEL wrote: Caller/Agency: Laurier from [facility] Callback Number: [phone] Service Requested: Physical Therapy Frequency: 2 week 4, 1 week 5 Any new concerns about the patient? No

## 2023-03-09 NOTE — Telephone Encounter (Signed)
 Copied from CRM [id number]. Topic: Clinical - Home Health Verbal Orders >> Mar 08, 2023  3:18 PM Arlina R wrote: Caller/Agency: Randol [facility] (Physical Therapist) Callback Number: [phone] Patient refused because of her birthday. Need to get verbal about being able to be seen for tomorrow. Each one only valid for 24 hrs. So need verbal consent for tomorrow 02/06/2024

## 2023-03-11 ENCOUNTER — Other Ambulatory Visit: Payer: Self-pay

## 2023-03-11 MED ORDER — LANTUS SOLOSTAR 100 UNIT/ML ~~LOC~~ SOPN: 35.0000 [IU] | PEN_INJECTOR | Freq: Every day | SUBCUTANEOUS | 1 refills | Status: DC

## 2023-03-12 DIAGNOSIS — R531 Weakness: Secondary | ICD-10-CM | POA: Diagnosis not present

## 2023-03-12 DIAGNOSIS — L299 Pruritus, unspecified: Secondary | ICD-10-CM | POA: Diagnosis not present

## 2023-03-12 DIAGNOSIS — L400 Psoriasis vulgaris: Secondary | ICD-10-CM | POA: Diagnosis not present

## 2023-03-12 DIAGNOSIS — I700 Atherosclerosis of aorta: Secondary | ICD-10-CM | POA: Diagnosis not present

## 2023-03-12 DIAGNOSIS — L720 Epidermal cyst: Secondary | ICD-10-CM | POA: Diagnosis not present

## 2023-03-15 DIAGNOSIS — E119 Type 2 diabetes mellitus without complications: Secondary | ICD-10-CM | POA: Diagnosis not present

## 2023-03-15 LAB — HM DIABETES EYE EXAM

## 2023-03-16 ENCOUNTER — Other Ambulatory Visit: Payer: Self-pay

## 2023-03-16 ENCOUNTER — Telehealth: Payer: Self-pay

## 2023-03-16 MED ORDER — LANCETS MISC. MISC: 1.0000 | Freq: Three times a day (TID) | 0 refills | Status: AC

## 2023-03-16 MED ORDER — BLOOD GLUCOSE TEST VI STRP: 1.0000 | ORAL_STRIP | Freq: Three times a day (TID) | 0 refills | Status: AC

## 2023-03-16 MED ORDER — LANCET DEVICE MISC: 1.0000 | Freq: Three times a day (TID) | 0 refills | Status: AC

## 2023-03-16 MED ORDER — BLOOD GLUCOSE MONITORING SUPPL DEVI: 1.0000 | Freq: Three times a day (TID) | 0 refills | Status: DC

## 2023-03-16 NOTE — Telephone Encounter (Signed)
 Copied from CRM [id number]. Topic: Appointments - Scheduling Inquiry for Clinic >> Mar 16, 2023  9:43 AM Elle L wrote: Reason for CRM: The patient needs to reschedule her mammogram appointment. Her call back number is [phone].

## 2023-03-16 NOTE — Telephone Encounter (Signed)
 Copied from CRM [id number]. Topic: Clinical - Medication Refill >> Mar 16, 2023  1:52 PM Curlee DEL wrote: Most Recent Primary Care Visit:  [facility]: [facility]  [facility]  Visit Type: ACUTE  Date: 02/22/2023  Medication: Patient needs to switch over to the following due to new insurance this year:  One touch starter kit which includes the reader One touch test strips # 100 with 12 Refills One touch lancets # 100 with 12 Refills   New insurance does not cover the Accu check brand which is on her medication list.  Has the patient contacted their pharmacy? No (Agent: If no, request that the patient contact the pharmacy for the refill. If patient does not wish to contact the pharmacy document the reason why and proceed with request.) (Agent: If yes, when and what did the pharmacy advise?)  Is this the correct pharmacy for this prescription? Yes If no, delete pharmacy and type the correct one.  This is the patient's preferred pharmacy:  [facility] [address] [place] - [address] [place] [address] Phone: [phone] Fax: [phone]   Has the prescription been filled recently? No  Is the patient out of the medication? No  Has the patient been seen for an appointment in the last year OR does the patient have an upcoming appointment? Yes  Can we respond through MyChart? Yes  Agent: Please be advised that Rx refills may take up to 3 business days. We ask that you follow-up with your pharmacy.

## 2023-03-16 NOTE — Telephone Encounter (Signed)
 Called and spoke with patient about rescheduling her mammogram appointment on 03/17/23. She wants to reschedule for March. I provided her with the phone number to centralized scheduling so she can get the appointment reschedule to her convenience. Pt verbalized understanding. Pt stated she will call today to reschedule appointment.

## 2023-03-17 ENCOUNTER — Inpatient Hospital Stay: Admission: RE | Admit: 2023-03-17 | Payer: Medicare Other | Source: Ambulatory Visit

## 2023-03-19 ENCOUNTER — Telehealth: Payer: Self-pay | Admitting: Family Medicine

## 2023-03-19 NOTE — Telephone Encounter (Signed)
Copied from CRM [id number]. Topic: Clinical - Prescription Issue >> Mar 19, 2023 11:15 AM Antony Haste wrote: Reason for CRM: This patient is requesting tirzepatide [facility]) 2.5 MG/0.5ML Pen  to be sent to [facility] pharmacy:  [id number], [place] - [address], [place] [address]. She is also wanting to know if her dosage will increase to 5 MG or stay at 2.5 MG? She states with her current dosage she has been feeling great and has lost 20 lbs, but she is unsure about the increase of dosage as she initially started off at 2.5 MG, but increased her to 5 MG previously. Callback #: [phone]

## 2023-03-22 ENCOUNTER — Telehealth: Payer: Self-pay

## 2023-03-22 ENCOUNTER — Other Ambulatory Visit: Payer: Self-pay

## 2023-03-22 MED ORDER — TIRZEPATIDE 5 MG/0.5ML ~~LOC~~ SOAJ: 5.0000 mg | SUBCUTANEOUS | 0 refills | Status: DC

## 2023-03-22 NOTE — Telephone Encounter (Signed)
Attempted to contact [facility] twice to give VO, unable to leave message and no answer. NO number provided in CRM message.

## 2023-03-22 NOTE — Telephone Encounter (Signed)
Copied from CRM [id number]. Topic: Clinical - Medical Advice >> Mar 22, 2023  9:03 AM Jorje Guild R wrote: Reason for CRM: Verbal order of occupational therapy needed.

## 2023-03-30 ENCOUNTER — Other Ambulatory Visit: Payer: Self-pay | Admitting: Family Medicine

## 2023-03-31 ENCOUNTER — Other Ambulatory Visit: Payer: Self-pay

## 2023-03-31 DIAGNOSIS — E1165 Type 2 diabetes mellitus with hyperglycemia: Secondary | ICD-10-CM

## 2023-03-31 MED ORDER — DROPLET PEN NEEDLES 31G X 5 MM MISC: 2 refills | Status: DC

## 2023-04-02 ENCOUNTER — Telehealth: Payer: Self-pay | Admitting: Family Medicine

## 2023-04-02 NOTE — Telephone Encounter (Signed)
Copied from CRM [id number]. Topic: Clinical - Medication Question >> Apr 02, 2023  2:24 PM Whitney Eaton C wrote: Reason for CRM: Alfredo Bach from [facility] is requesting a call back to discuss the patients medication: insulin lispro (HUMALOG KWIKPEN) 200 UNIT/ML KwikPen. He would like a call  back at [phone]

## 2023-04-04 ENCOUNTER — Other Ambulatory Visit: Payer: Self-pay | Admitting: Family Medicine

## 2023-04-04 DIAGNOSIS — E559 Vitamin D deficiency, unspecified: Secondary | ICD-10-CM

## 2023-04-05 NOTE — Telephone Encounter (Signed)
Called and spoke with Alfredo Bach who wanted to confirm patients dose of Lantus and Humalog. Also questioned if she also takes humalin R, confirmed she does not.

## 2023-04-07 ENCOUNTER — Telehealth: Payer: Self-pay

## 2023-04-07 NOTE — Telephone Encounter (Signed)
 Returned call to provide information, representative stated information would need to be faxed back via their document that they faxed over. Confirmed our office fax number.  Copied from CRM [id number]. Topic: Clinical - Lab/Test Results >> Apr 07, 2023  1:19 PM Whitney Eaton wrote: Reason for CRM: [employer] is trying to complete the PA they received for MOUNJARO . They need the A1C results, date of lab, and the member's diagnosis. Please contact asap at [phone] Option 2

## 2023-04-14 ENCOUNTER — Other Ambulatory Visit: Payer: Self-pay | Admitting: Family Medicine

## 2023-04-14 MED ORDER — TIRZEPATIDE 5 MG/0.5ML ~~LOC~~ SOAJ: 5.0000 mg | SUBCUTANEOUS | 0 refills | Status: DC

## 2023-04-14 NOTE — Telephone Encounter (Signed)
Copied from CRM [id number]. Topic: Clinical - Medication Refill >> Apr 14, 2023  1:37 PM Fonda Kinder J wrote: Most Recent Primary Care Visit:  [facility]: [facility]  [facility]  Visit Type: ACUTE  Date: 02/22/2023  Medication: tirzepatide [facility]) 5 MG/0.5ML Pen  Has the patient contacted their pharmacy? Yes (Agent: If no, request that the patient contact the pharmacy for the refill. If patient does not wish to contact the pharmacy document the reason why and proceed with request.) (Agent: If yes, when and what did the pharmacy advise?) Contact PCP  Is this the correct pharmacy for this prescription? Yes If no, delete pharmacy and type the correct one.  This is the patient's preferred pharmacy:  [facility] [address], [place] - [address] [place] [address] Phone: [phone] Fax: [phone]   Has the prescription been filled recently? Yes  Is the patient out of the medication? No  Has the patient been seen for an appointment in the last year OR does the patient have an upcoming appointment? Yes  Can we respond through MyChart? Yes  Agent: Please be advised that Rx refills may take up to 3 business days. We ask that you follow-up with your pharmacy.

## 2023-04-19 ENCOUNTER — Other Ambulatory Visit: Payer: Self-pay | Admitting: Family Medicine

## 2023-04-19 MED ORDER — ONETOUCH ULTRA 2 W/DEVICE KIT: 1.0000 | PACK | Freq: Once | 0 refills | Status: AC

## 2023-04-19 NOTE — Telephone Encounter (Signed)
 Copied from CRM [id number]. Topic: Clinical - Medication Refill >> Apr 19, 2023  8:39 AM Clayton Bibles wrote: Most Recent Primary Care Visit:  [facility]: [facility]  [facility]  Visit Type: ACUTE  Date: 02/22/2023  Medication: Blood Glucose Monitoring Suppl (ONE TOUCH ULTRA 2) w/Device KIT - [place] needs the One Touch Ultra 2 Machine  Has the patient contacted their pharmacy? No (Agent: If no, request that the patient contact the pharmacy for the refill. If patient does not wish to contact the pharmacy document the reason why and proceed with request.) (Agent: If yes, when and what did the pharmacy advise?)  Is this the correct pharmacy for this prescription? Yes If no, delete pharmacy and type the correct one.  This is the patient's preferred pharmacy:  [facility]  [place], [place] Phone: [phone]  Fax [phone]  Has the prescription been filled recently? No  Is the patient out of the medication? No  Has the patient been seen for an appointment in the last year OR does the patient have an upcoming appointment? No  Can we respond through MyChart? No  Agent: Please be advised that Rx refills may take up to 3 business days. We ask that you follow-up with your pharmacy.

## 2023-04-19 NOTE — Telephone Encounter (Signed)
 Copied from CRM [id number]. Topic: Clinical - Medication Question >> Apr 19, 2023  8:35 AM Clayton Bibles wrote: Reason for CRM: Whitney Eaton received her refill for tirzepatide [facility]) 5 MG/0.5ML Pen. Brenlynn thought she was getting 7 MG or 7.5 MG and she got the 5 MG again. She wants to know if she needs to take the 5 MG for the second month or have Dr. Sedalia Muta call in the 7 MG or 7.5 MG. Please call [place] at [phone] or [phone]

## 2023-04-19 NOTE — Telephone Encounter (Signed)
 Refill sent to pharmacy.

## 2023-05-06 ENCOUNTER — Telehealth: Payer: Self-pay | Admitting: Family Medicine

## 2023-05-06 NOTE — Telephone Encounter (Signed)
 [facility] HOME HEALTH - SUPPLEMENTAL ORDERS - 04/23/23 - 04/23/23

## 2023-05-08 ENCOUNTER — Other Ambulatory Visit: Payer: Self-pay | Admitting: Family Medicine

## 2023-05-25 NOTE — Progress Notes (Unsigned)
 Subjective:  Patient ID: Whitney Eaton, female    DOB: 1950/09/08  Age: 73 y.o. MRN: [id number]  Chief Complaint  Patient presents with   Medical Management of Chronic Issues    Discussed the use of AI scribe software for clinical note transcription with the patient, who gave verbal consent to proceed.  History of Present Illness  The patient, with a history of diabetes, presents with GI symptoms including indigestion, bloating, and excessive gas. She attributes these symptoms to the medication Mounjaro, which she discontinued two weeks ago. The symptoms have improved since discontinuation, but some gas remains.  The patient has been managing her diabetes with Lantus insulin (35 units) and sliding scale insulin before meals. However, she only takes the Humalog if her blood sugar is above 150. She monitors her blood sugar with a Freestyle Libre and reports that her levels are typically between 80 and 120, occasionally spiking to 150 after meals. She has made significant dietary changes, including eliminating fast food and soda, which she believes has helped control her blood sugar.  The patient also reports a back issue that limits her ability to be on her feet for extended periods. She manages the pain with Tylenol 8 hour, which she reports is effective. She also mentions a weight loss of 21 pounds since December.  The patient denies feeling depressed or sad and reports sleeping well with the use of her CPAP machine. She has not been taking meloxicam for her back pain.      Diabetes:  Complications: hypertension  Glucose checking: CGM 80-120 Most recent A1C: 12.7 Current medications: Currently on insulin therapy (Lantus 35 units AM, Humalog before meals and at bedtime.) Stopped mounjaro due to indigestion. Major changes to diet.  Walks some.  Check sugars before meals Increase to 6 U prior to breakfast, lunch, and supper.  71-150 0 units 151-200 2 Units 201-250 4 Units 251-300 6  Units 301-350 8 units 351-400 10 Units.  > 400 10 Units call office.   Hyperlipidemia: Not taking any medicines. Intolerant to statins.  Hypertension: Current medications: Valsartan/hydrochlorothiazide 320/25 mg once daily.  OSA: Wearing cpap. [facility].   Insomnia: on zolpidem 10 mg before bed.  Asthma: Not using breztri.  Psoriatic arthritis:  Mobility has worsened. Taking advil which helps some. Heating pad helps. Had fall earlier this year. No major injuries.   Hypothyroidism: on levothyroxine 100 mcg once daily in am.   Back pain: takes tylenol extra strength, which helps.     05/26/2023   10:15 AM 02/08/2023    2:43 PM 02/19/2022    8:26 AM 04/17/2021    3:20 PM 11/01/2020   11:25 AM  Depression screen PHQ 2/9  Decreased Interest 0 0 1 3 0  Down, Depressed, Hopeless 0 0 1 3 0  PHQ - 2 Score 0 0 2 6 0  Altered sleeping  0 2 2   Tired, decreased energy  0 3 3   Change in appetite  0 1 2   Feeling bad or failure about yourself   0 1 2   Trouble concentrating  0 0 0   Moving slowly or fidgety/restless  0 0 0   Suicidal thoughts  0 0 0   PHQ-9 Score  0 9 15   Difficult doing work/chores  Not difficult at all Somewhat difficult Very difficult         05/26/2023   10:15 AM  Fall Risk   Falls in the past year?  1  Number falls in past yr: 0  Injury with Fall? 0  Risk for fall due to : Impaired balance/gait  Follow up Falls evaluation completed    Patient Care Team: Blane Ohara, MD as PCP - General (Family Medicine) Denamur, Karsten Ro, DC as Referring Physician (Chiropractic Medicine) Jonell Cluck, PA-C (Dermatology) Yvette Rack, OD (Optometry) Prescilla Sours, FNP as Nurse Practitioner (Urology) Leron Croak, Ray A, OD (Optometry)   Review of Systems  Constitutional:  Negative for chills, fatigue and fever.  HENT:  Negative for congestion, ear pain, rhinorrhea and sore throat.   Respiratory:  Negative for cough and shortness of breath.   Cardiovascular:   Negative for chest pain.  Gastrointestinal:  Negative for abdominal pain, constipation, diarrhea, nausea and vomiting.  Genitourinary:  Negative for dysuria and urgency.  Musculoskeletal:  Negative for back pain and myalgias.  Neurological:  Negative for dizziness, weakness, light-headedness and headaches.  Psychiatric/Behavioral:  Negative for dysphoric mood. The patient is not nervous/anxious.     Current Outpatient Medications on File Prior to Visit  Medication Sig Dispense Refill   BD VEO INSULIN SYRINGE U/F 31G X 15/64" 0.3 ML MISC as directed 100 each 11   blood glucose meter kit and supplies KIT Check sugars fasting. E11.69 1 each 0   DROPLET PEN NEEDLES 31G X 5 MM MISC use as directed 100 each 2   Glucose Blood (BLOOD GLUCOSE TEST STRIPS) STRP 1 each by In Vitro route in the morning, at noon, and at bedtime. May substitute to any manufacturer covered by patient's insurance. 300 strip 0   insulin glargine (LANTUS SOLOSTAR) 100 UNIT/ML Solostar Pen INJECT 35 UNITS SUBCUTANEOUSLY DAILY 30 mL 0   insulin lispro (HUMALOG KWIKPEN) 200 UNIT/ML KwikPen Sliding scale using humalog Kwikpen with max daily dose is 30 U. 6 mL 1   Lancets (ONETOUCH DELICA PLUS LANCET33G) MISC USE TO CHECK BLOOD SUGAR MORNING, NOON AND BEDTIME 100 each 3   levothyroxine (SYNTHROID) 100 MCG tablet Take 1 tablet (100 mcg total) by mouth daily. 90 tablet 3   meloxicam (MOBIC) 15 MG tablet Take 1 tablet (15 mg total) by mouth daily. 30 tablet 1   valsartan-hydrochlorothiazide (DIOVAN-HCT) 320-25 MG tablet Take 1 tablet by mouth daily. 90 tablet 1   zolpidem (AMBIEN) 10 MG tablet TAKE 1 TABLET(10 MG) BY MOUTH AT BEDTIME 30 tablet 2   No current facility-administered medications on file prior to visit.   Past Medical History:  Diagnosis Date   Asthma    Diabetes (HCC)    Hyperlipemia    Hypertension    Psoriasis    Sleep apnea    Thyroid disease    Past Surgical History:  Procedure Laterality Date   ABDOMINAL  HYSTERECTOMY     CHOLECYSTECTOMY  2011   HYSTERECTOMY ABDOMINAL WITH SALPINGECTOMY     OOPHORECTOMY  1980    Family History  Problem Relation Age of Onset   Alzheimer's disease Mother    Breast cancer Mother    Arthritis Mother    Diabetes Mother    Eczema Father    CAD Father    Diabetes Father    Parkinson's disease Father    Breast cancer Sister    Neurologic Disorder Brother    Glaucoma Maternal Grandmother    Heart disease Other    Social History   Socioeconomic History   Marital status: Married    Spouse name: Not on file   Number of children: 2   Years of education: Not  on file   Highest education level: GED or equivalent  Occupational History   Occupation: homemaker  Tobacco Use   Smoking status: Never   Smokeless tobacco: Never  Vaping Use   Vaping status: Never Used  Substance and Sexual Activity   Alcohol use: Never   Drug use: Never   Sexual activity: Not on file  Other Topics Concern   Not on file  Social History Narrative   Not on file   Social Drivers of Health   Financial Resource Strain: Low Risk  (02/14/2023)   Overall Financial Resource Strain (CARDIA)    Difficulty of Paying Living Expenses: Not hard at all  Food Insecurity: No Food Insecurity (02/14/2023)   Hunger Vital Sign    Worried About Running Out of Food in the Last Year: Never true    Ran Out of Food in the Last Year: Never true  Transportation Needs: No Transportation Needs (02/14/2023)   PRAPARE - Administrator, Civil Service (Medical): No    Lack of Transportation (Non-Medical): No  Physical Activity: Insufficiently Active (02/14/2023)   Exercise Vital Sign    Days of Exercise per Week: 3 days    Minutes of Exercise per Session: 10 min  Stress: No Stress Concern Present (02/14/2023)   [employer] of Occupational Health - Occupational Stress Questionnaire    Feeling of Stress : Only a little  Social Connections: Unknown (05/26/2023)   Social Connection  and Isolation Panel [NHANES]    Frequency of Communication with Friends and Family: Three times a week    Frequency of Social Gatherings with Friends and Family: Once a week    Attends Religious Services: Patient declined    Database administrator or Organizations: Yes    Attends Engineer, structural: Patient declined    Marital Status: Married    Objective:  BP 118/72   Pulse 83   Temp 98.5 F (36.9 C)   Ht 5\' 8"  (1.727 m)   Wt 279 lb (126.6 kg)   SpO2 94%   BMI 42.42 kg/m      05/26/2023   10:12 AM 02/22/2023   11:00 AM 02/15/2023    8:15 AM  BP/Weight  Systolic BP 118 110 132  Diastolic BP 72 62 78  Wt. (Lbs) 279 278.4 274  BMI 42.42 kg/m2 42.33 kg/m2 41.66 kg/m2    Physical Exam  Diabetic Foot Exam - Simple   No data filed      Lab Results  Component Value Date   WBC 9.2 05/26/2023   HGB 12.9 05/26/2023   HCT 39.1 05/26/2023   PLT 270 05/26/2023   GLUCOSE 118 (H) 05/26/2023   CHOL 189 05/26/2023   TRIG 196 (H) 05/26/2023   HDL 39 (L) 05/26/2023   LDLCALC 116 (H) 05/26/2023   ALT 17 05/26/2023   AST 18 05/26/2023   NA 140 05/26/2023   K 4.0 05/26/2023   CL 99 05/26/2023   CREATININE 1.22 (H) 05/26/2023   BUN 25 05/26/2023   CO2 24 05/26/2023   TSH 15.700 (H) 02/15/2023   HGBA1C 6.2 (H) 05/26/2023   MICROALBUR 10 04/24/2020      Assessment & Plan:  Assessment and Plan   Type 2 Diabetes Mellitus Type 2 Diabetes Mellitus managed with Lantus insulin 35 units and sliding scale Humalog based on Freestyle Libre readings. Blood glucose levels range from 80 to 150 mg/dL with occasional spikes managed by dietary adjustments. Last A1c was high, but recent weight  loss of 21 pounds since December 19th indicates improved management. Significant dietary changes, including avoiding fast food and sugary drinks, contribute to better glucose control. Previously on Mounjaro but discontinued due to side effects including indigestion, bloating, and excessive  gas. - Continue Lantus insulin 35 units daily - Use sliding scale Humalog as needed based on Freestyle Libre readings - Encourage continued dietary management to maintain blood glucose control - Order A1c test to assess current glycemic control  Hyperlipidemia Hyperlipidemia with intolerance to statins. Previous cholesterol levels were high. Repatha discussed as a potential treatment option if cholesterol levels remain elevated. - Order lipid panel to assess current cholesterol levels - Consider initiating Repatha if cholesterol levels remain elevated  Hypothyroidism Hypothyroidism managed with medication. Thyroid levels were previously off, and she is nearing the end of her current prescription. Rechecking thyroid function is necessary to adjust medication appropriately. - Order thyroid function tests to assess current thyroid levels - Refill thyroid medication based on test results  Chronic Back Pain Chronic back pain limits ability to stand for extended periods. Managed with Tylenol 8-hour, providing relief. History of sciatica but no current radiating leg pain. Activity modification is important to prevent exacerbation of symptoms. - Continue Tylenol 8-hour for pain management as needed - Encourage activity modification to prevent exacerbation of back pain  Obstructive Sleep Apnea Obstructive sleep apnea managed with CPAP therapy. She is compliant and reports sleeping well. Prefers to continue with current durable medical equipment provider, [facility]. - Ensure continued CPAP use - Coordinate with [facility] for CPAP supplies        Hypertension associated with diabetes [facility]) Assessment & Plan: Diabetes very poorly controlle. Hypertension well controlled.  Continue Valsartan/hydrochlorothiazide 320/25 mg once daily. Continue to work on eating a healthy diet and exercise.  Labs drawn today.   Orders: -     CBC with Differential/Platelet -      Comprehensive metabolic panel with GFR  Irritable bowel syndrome with both constipation and diarrhea Assessment & Plan: Management per specialist  Orders: -     Hemoglobin A1c -     Microalbumin / creatinine urine ratio  Uncontrolled type 2 diabetes mellitus with hyperglycemia (HCC) Assessment & Plan: Type 2 Diabetes Mellitus managed with Lantus insulin 35 units and sliding scale Humalog based on Freestyle Libre readings. Blood glucose levels range from 80 to 150 mg/dL with occasional spikes managed by dietary adjustments. Last A1c was high, but recent weight loss of 21 pounds since December 19th indicates improved management. Significant dietary changes, including avoiding fast food and sugary drinks, contribute to better glucose control. Previously on Mounjaro but discontinued due to side effects including indigestion, bloating, and excessive gas. - Continue Lantus insulin 35 units daily - Use sliding scale Humalog as needed based on Freestyle Libre readings - Encourage continued dietary management to maintain blood glucose control - Order A1c test to assess current glycemic control   Other specified hypothyroidism Assessment & Plan: Hypothyroidism managed with medication. Thyroid levels were previously off, and she is nearing the end of her current prescription. Rechecking thyroid function is necessary to adjust medication appropriately. - Order thyroid function tests to assess current thyroid levels - Refill thyroid medication based on test results   Mixed hyperlipidemia Assessment & Plan: Cholesterol is very high in all areas.  I would recommend we give you Repatha 140 mg IM every 2 weeks.   Orders: -     Lipid panel  Vitamin D insufficiency Assessment & Plan:  Check levels.  Orders: -     Vitamin D (Ergocalciferol); Take 1 capsule (50,000 Units total) by mouth every 7 (seven) days.  Dispense: 12 capsule; Refill: 0  Hypokalemia Assessment & Plan: The current medical  regimen is effective;  continue present plan and medications.   Orders: -     Potassium Chloride Crys ER; Take 1 tablet (10 mEq total) by mouth 2 (two) times daily.  Dispense: 180 tablet; Refill: 0  Chronic low back pain without sciatica, unspecified back pain laterality Assessment & Plan: Chronic back pain limits ability to stand for extended periods. Managed with Tylenol 8-hour, providing relief. History of sciatica but no current radiating leg pain. Activity modification is important to prevent exacerbation of symptoms. - Continue Tylenol 8-hour for pain management as needed - Encourage activity modification to prevent exacerbation of back pain   Obstructive sleep apnea Assessment & Plan: Obstructive sleep apnea managed with CPAP therapy. She is compliant and reports sleeping well. Prefers to continue with current durable medical equipment provider, [facility]. - Ensure continued CPAP use - Coordinate with [facility] for CPAP supplies       Meds ordered this encounter  Medications   Vitamin D, Ergocalciferol, (DRISDOL) 1.25 MG (50000 UNIT) CAPS capsule    Sig: Take 1 capsule (50,000 Units total) by mouth every 7 (seven) days.    Dispense:  12 capsule    Refill:  0   potassium chloride (KLOR-CON M) 10 MEQ tablet    Sig: Take 1 tablet (10 mEq total) by mouth 2 (two) times daily.    Dispense:  180 tablet    Refill:  0    Orders Placed This Encounter  Procedures   CBC with Differential/Platelet   Comprehensive metabolic panel   Hemoglobin A1c   Lipid panel   Microalbumin / creatinine urine ratio     Follow-up: Return in about 3 months (around 08/26/2023) for chronic fasting.   I,Marla I Leal-Borjas,acting as a scribe for Blane Ohara, MD.,have documented all relevant documentation on the behalf of Blane Ohara, MD,as directed by  Blane Ohara, MD while in the presence of Blane Ohara, MD.   An After Visit Summary was printed and given to the  patient.  I attest that I have reviewed this visit and agree with the plan scribed by my staff.   Blane Ohara, MD [facility] [phone]

## 2023-05-26 ENCOUNTER — Encounter: Payer: Self-pay | Admitting: Gastroenterology

## 2023-05-26 ENCOUNTER — Encounter: Payer: Self-pay | Admitting: Family Medicine

## 2023-05-26 ENCOUNTER — Ambulatory Visit (INDEPENDENT_AMBULATORY_CARE_PROVIDER_SITE_OTHER): Payer: Medicare Other | Admitting: Family Medicine

## 2023-05-26 VITALS — BP 118/72 | HR 83 | Temp 98.5°F | Ht 68.0 in | Wt 279.0 lb

## 2023-05-26 DIAGNOSIS — I152 Hypertension secondary to endocrine disorders: Secondary | ICD-10-CM | POA: Diagnosis not present

## 2023-05-26 DIAGNOSIS — E038 Other specified hypothyroidism: Secondary | ICD-10-CM

## 2023-05-26 DIAGNOSIS — E559 Vitamin D deficiency, unspecified: Secondary | ICD-10-CM

## 2023-05-26 DIAGNOSIS — K582 Mixed irritable bowel syndrome: Secondary | ICD-10-CM | POA: Diagnosis not present

## 2023-05-26 DIAGNOSIS — G4733 Obstructive sleep apnea (adult) (pediatric): Secondary | ICD-10-CM

## 2023-05-26 DIAGNOSIS — L4050 Arthropathic psoriasis, unspecified: Secondary | ICD-10-CM | POA: Diagnosis not present

## 2023-05-26 DIAGNOSIS — E1159 Type 2 diabetes mellitus with other circulatory complications: Secondary | ICD-10-CM

## 2023-05-26 DIAGNOSIS — I10 Essential (primary) hypertension: Secondary | ICD-10-CM

## 2023-05-26 DIAGNOSIS — E782 Mixed hyperlipidemia: Secondary | ICD-10-CM

## 2023-05-26 DIAGNOSIS — E1165 Type 2 diabetes mellitus with hyperglycemia: Secondary | ICD-10-CM | POA: Diagnosis not present

## 2023-05-26 DIAGNOSIS — M545 Low back pain, unspecified: Secondary | ICD-10-CM

## 2023-05-26 DIAGNOSIS — E876 Hypokalemia: Secondary | ICD-10-CM | POA: Diagnosis not present

## 2023-05-26 DIAGNOSIS — G8929 Other chronic pain: Secondary | ICD-10-CM

## 2023-05-26 MED ORDER — VITAMIN D (ERGOCALCIFEROL) 1.25 MG (50000 UNIT) PO CAPS: 50000.0000 [IU] | ORAL_CAPSULE | ORAL | 0 refills | Status: DC

## 2023-05-26 MED ORDER — POTASSIUM CHLORIDE CRYS ER 10 MEQ PO TBCR: 10.0000 meq | EXTENDED_RELEASE_TABLET | Freq: Two times a day (BID) | ORAL | 0 refills | Status: DC

## 2023-05-26 NOTE — Assessment & Plan Note (Signed)
 Hypertension well controlled.  Continue Valsartan/hydrochlorothiazide 320/25 mg once daily. Continue to work on eating a healthy diet and exercise.  Labs drawn today.

## 2023-05-26 NOTE — Assessment & Plan Note (Signed)
 Cholesterol previously poorly controlled. Improved with dietary changes.   I would recommend we give you Repatha 140 mg IM every 2 weeks. Patient would like to wait.

## 2023-05-26 NOTE — Assessment & Plan Note (Signed)
 Management per specialist.

## 2023-05-26 NOTE — Assessment & Plan Note (Signed)
 Continue taking Lantus and Humalog before meals and at bedtime. We will also initiate continuous glucose monitoring with Dexcom to better track your blood sugar levels.

## 2023-05-26 NOTE — Assessment & Plan Note (Signed)
 Check levels

## 2023-05-26 NOTE — Assessment & Plan Note (Signed)
 Previously well controlled Continue Synthroid at current dose

## 2023-05-26 NOTE — Assessment & Plan Note (Signed)
 The current medical regimen is effective;  continue present plan and medications.

## 2023-05-27 ENCOUNTER — Encounter: Payer: Self-pay | Admitting: Family Medicine

## 2023-05-27 LAB — CBC WITH DIFFERENTIAL/PLATELET
Basophils Absolute: 0.1 10*3/uL (ref 0.0–0.2)
Basos: 1 %
EOS (ABSOLUTE): 0.7 10*3/uL — ABNORMAL HIGH (ref 0.0–0.4)
Eos: 8 %
Hematocrit: 39.1 % (ref 34.0–46.6)
Hemoglobin: 12.9 g/dL (ref 11.1–15.9)
Immature Grans (Abs): 0 10*3/uL (ref 0.0–0.1)
Immature Granulocytes: 0 %
Lymphocytes Absolute: 2.4 10*3/uL (ref 0.7–3.1)
Lymphs: 26 %
MCH: 30.3 pg (ref 26.6–33.0)
MCHC: 33 g/dL (ref 31.5–35.7)
MCV: 92 fL (ref 79–97)
Monocytes Absolute: 0.6 10*3/uL (ref 0.1–0.9)
Monocytes: 6 %
Neutrophils Absolute: 5.3 10*3/uL (ref 1.4–7.0)
Neutrophils: 59 %
Platelets: 270 10*3/uL (ref 150–450)
RBC: 4.26 x10E6/uL (ref 3.77–5.28)
RDW: 12.6 % (ref 11.7–15.4)
WBC: 9.2 10*3/uL (ref 3.4–10.8)

## 2023-05-27 LAB — COMPREHENSIVE METABOLIC PANEL WITH GFR
ALT: 17 IU/L (ref 0–32)
AST: 18 IU/L (ref 0–40)
Albumin: 4.1 g/dL (ref 3.8–4.8)
Alkaline Phosphatase: 89 IU/L (ref 44–121)
BUN/Creatinine Ratio: 20 (ref 12–28)
BUN: 25 mg/dL (ref 8–27)
Bilirubin Total: 0.3 mg/dL (ref 0.0–1.2)
CO2: 24 mmol/L (ref 20–29)
Calcium: 9.6 mg/dL (ref 8.7–10.3)
Chloride: 99 mmol/L (ref 96–106)
Creatinine, Ser: 1.22 mg/dL — ABNORMAL HIGH (ref 0.57–1.00)
Globulin, Total: 2.9 g/dL (ref 1.5–4.5)
Glucose: 118 mg/dL — ABNORMAL HIGH (ref 70–99)
Potassium: 4 mmol/L (ref 3.5–5.2)
Sodium: 140 mmol/L (ref 134–144)
Total Protein: 7 g/dL (ref 6.0–8.5)
eGFR: 47 mL/min/{1.73_m2} — ABNORMAL LOW (ref >59)

## 2023-05-27 LAB — LIPID PANEL
Chol/HDL Ratio: 4.8 ratio — ABNORMAL HIGH (ref 0.0–4.4)
Cholesterol, Total: 189 mg/dL (ref 100–199)
HDL: 39 mg/dL — ABNORMAL LOW (ref >39)
LDL Chol Calc (NIH): 116 mg/dL — ABNORMAL HIGH (ref 0–99)
Triglycerides: 196 mg/dL — ABNORMAL HIGH (ref 0–149)
VLDL Cholesterol Cal: 34 mg/dL (ref 5–40)

## 2023-05-27 LAB — MICROALBUMIN / CREATININE URINE RATIO
Creatinine, Urine: 180.7 mg/dL
Microalb/Creat Ratio: 2 mg/g{creat} (ref 0–29)
Microalbumin, Urine: 3 ug/mL

## 2023-05-27 LAB — HEMOGLOBIN A1C
Est. average glucose Bld gHb Est-mCnc: 131 mg/dL
Hgb A1c MFr Bld: 6.2 % — ABNORMAL HIGH (ref 4.8–5.6)

## 2023-05-27 NOTE — Assessment & Plan Note (Signed)
 Chronic back pain limits ability to stand for extended periods. Managed with Tylenol 8-hour, providing relief. History of sciatica but no current radiating leg pain. Activity modification is important to prevent exacerbation of symptoms. - Continue Tylenol 8-hour for pain management as needed - Encourage activity modification to prevent exacerbation of back pain

## 2023-05-27 NOTE — Assessment & Plan Note (Signed)
 Hypothyroidism managed with medication. Thyroid levels were previously off, and she is nearing the end of her current prescription. Rechecking thyroid function is necessary to adjust medication appropriately. - Order thyroid function tests to assess current thyroid levels - Refill thyroid medication based on test results

## 2023-05-27 NOTE — Assessment & Plan Note (Signed)
 Type 2 Diabetes Mellitus managed with Lantus insulin 35 units and sliding scale Humalog based on Freestyle Libre readings. Blood glucose levels range from 80 to 150 mg/dL with occasional spikes managed by dietary adjustments. Last A1c was high, but recent weight loss of 21 pounds since December 19th indicates improved management. Significant dietary changes, including avoiding fast food and sugary drinks, contribute to better glucose control. Previously on Mounjaro but discontinued due to side effects including indigestion, bloating, and excessive gas. - Continue Lantus insulin 35 units daily - Use sliding scale Humalog as needed based on Freestyle Libre readings - Encourage continued dietary management to maintain blood glucose control - Order A1c test to assess current glycemic control

## 2023-05-27 NOTE — Assessment & Plan Note (Signed)
 Obstructive sleep apnea managed with CPAP therapy. She is compliant and reports sleeping well. Prefers to continue with current durable medical equipment provider, [facility]. - Ensure continued CPAP use - Coordinate with [facility] for CPAP supplies

## 2023-05-28 ENCOUNTER — Encounter: Payer: Self-pay | Admitting: Family Medicine

## 2023-05-29 LAB — TSH+FREE T4
Free T4: 1.3 ng/dL (ref 0.82–1.77)
TSH: 7.56 u[IU]/mL — ABNORMAL HIGH (ref 0.450–4.500)

## 2023-05-29 LAB — SPECIMEN STATUS REPORT

## 2023-05-31 ENCOUNTER — Other Ambulatory Visit: Payer: Self-pay

## 2023-05-31 DIAGNOSIS — E038 Other specified hypothyroidism: Secondary | ICD-10-CM

## 2023-05-31 MED ORDER — LEVOTHYROXINE SODIUM 125 MCG PO TABS: 125.0000 ug | ORAL_TABLET | Freq: Every day | ORAL | 3 refills | Status: DC

## 2023-06-23 ENCOUNTER — Ambulatory Visit

## 2023-06-24 ENCOUNTER — Other Ambulatory Visit: Payer: Self-pay

## 2023-06-24 DIAGNOSIS — E038 Other specified hypothyroidism: Secondary | ICD-10-CM

## 2023-06-29 ENCOUNTER — Telehealth: Payer: Self-pay

## 2023-06-29 NOTE — Telephone Encounter (Signed)
 Note and sleep study faxed to number below.  Copied from CRM [id number]. Topic: Clinical - Request for Lab/Test Order >> Jun 28, 2023  2:26 PM Baldemar Lev wrote: Reason for CRM:   Bette calling from [facility]   Best contact: [phone]  Fax: [phone]  CPAP Machine her study in 2007, does not have chart notes prior to this date Pt states she has had a new sleep study in the last seven years.  Sleep study needed and chart notes.

## 2023-07-08 DIAGNOSIS — G4733 Obstructive sleep apnea (adult) (pediatric): Secondary | ICD-10-CM | POA: Diagnosis not present

## 2023-08-03 ENCOUNTER — Other Ambulatory Visit: Payer: Self-pay | Admitting: Family Medicine

## 2023-08-08 DIAGNOSIS — G4733 Obstructive sleep apnea (adult) (pediatric): Secondary | ICD-10-CM | POA: Diagnosis not present

## 2023-08-11 ENCOUNTER — Ambulatory Visit: Admitting: Gastroenterology

## 2023-08-24 ENCOUNTER — Other Ambulatory Visit: Payer: Self-pay | Admitting: Family Medicine

## 2023-08-24 DIAGNOSIS — E038 Other specified hypothyroidism: Secondary | ICD-10-CM

## 2023-08-31 NOTE — Progress Notes (Signed)
 Subjective:  Patient ID: Whitney Eaton, female    DOB: [birth year]  Age: 73 y.o. MRN: [id number]  Chief Complaint  Patient presents with   Medical Management of Chronic Issues    HPI: Discussed the use of AI scribe software for clinical note transcription with the patient, who gave verbal consent to proceed.  History of Present Illness   Whitney Eaton is a 73 year old female with diabetes and sleep apnea who presents with issues related to CPAP mask fit and blood sugar management.  Obstructive sleep apnea and cpap mask issues - Difficulty with CPAP mask fit; current Airfit 20 medium mask is too small - Mouth breathing during sleep causes mask to not fit properly when mouth drops open, resulting in inadequate therapy - Waiting for another mask.   Insomnia - Currently taking Ambien  for sleep, but finds it less effective than before, possibly due to long-term use - Has not tried other sleep medications except Advil PM  Diabetes mellitus and glycemic control - Regularly monitors blood glucose using Freestyle Libre - Current blood sugar is 143 mg/dL; has not eaten today but took Lantus  - Target blood sugar range is 80 to 140 mg/dL - Insulin  regimen: 35 units of Lantus  in the morning. -Humalog  on 6 U prior to breakfast, lunch, and supper.  71-150 0 units 151-200 2 Units 201-250 4 Units 251-300 6 Units 301-350 8 units 351-400 10 Units.  > 400 10 Units call office. - Significant weight loss from 302 lbs (December) to 274 lbs - A1c improved from 12% to 6.2% - Improved glycemic control attributed to dietary changes focusing on protein intake, though finds diet monotonous - Limited recent exercise  Psoriatic arthritis and dermatologic symptoms - On Bimzelx, a once-a-month injection, which is very painful but effective in clearing skin - Pays $10/month for Bimzelx through Medicare payment plan  Chronic back pain and arthritis - Chronic back pain attributed to arthritis; previous  x-rays confirm arthritis - No radiation of pain down the leg - Has seen a chiropractor for management - Takes Tylenol for pain relief; uses Advil more than she should despite being aware of need to avoid due to abnormal kidney function (CKD 3A)  Asthma - Asthma is currently well-controlled - Not using any inhalers at this time     Hyperlipidemia: Not taking any medicines. Intolerant to statins.  Hypertension: Current medications: Valsartan /hydrochlorothiazide  320/25 mg once daily.  Hypothyroidism: on levothyroxine  100 mcg once daily in am.      09/01/2023   10:22 AM 05/26/2023   10:15 AM 02/08/2023    2:43 PM 02/19/2022    8:26 AM 04/17/2021    3:20 PM  Depression screen PHQ 2/9  Decreased Interest 0 0 0 1 3  Down, Depressed, Hopeless 0 0 0 1 3  PHQ - 2 Score 0 0 0 2 6  Altered sleeping   0 2 2  Tired, decreased energy   0 3 3  Change in appetite   0 1 2  Feeling bad or failure about yourself    0 1 2  Trouble concentrating   0 0 0  Moving slowly or fidgety/restless   0 0 0  Suicidal thoughts   0 0 0  PHQ-9 Score   0 9 15  Difficult doing work/chores   Not difficult at all Somewhat difficult Very difficult        09/01/2023   10:22 AM  Fall Risk   Falls in the past year? 1  Number falls in past yr: 0  Injury with Fall? 0  Risk for fall due to : Impaired balance/gait  Follow up Falls evaluation completed    Patient Care Team: Sherre Clapper, MD as PCP - General (Family Medicine) Denamur, Darin, DC as Referring Physician (Chiropractic Medicine) Tonnie Race, PA-C (Dermatology) Maryjo Levander LABOR, OD (Optometry) Ival Domino, FNP as Nurse Practitioner (Urology) Maryjo, Ray A, OD (Optometry)   Review of Systems  Constitutional:  Negative for chills, fatigue and fever.  HENT:  Negative for congestion, ear pain, rhinorrhea and sore throat.   Respiratory:  Negative for cough and shortness of breath.   Cardiovascular:  Negative for chest pain.  Gastrointestinal:  Negative  for abdominal pain, constipation, diarrhea, nausea and vomiting.  Genitourinary:  Negative for dysuria and urgency.  Musculoskeletal:  Positive for arthralgias and back pain. Negative for myalgias.  Neurological:  Negative for dizziness, weakness, light-headedness and headaches.  Psychiatric/Behavioral:  Negative for dysphoric mood. The patient is not nervous/anxious.     Current Outpatient Medications on File Prior to Visit  Medication Sig Dispense Refill   bimekizumab-bkzx (BIMZELX) 160 MG/ML prefilled syringe Inject 160 mg into the skin every 30 (thirty) days.     BD VEO INSULIN  SYRINGE U/F 31G X 15/64 0.3 ML MISC as directed 100 each 11   blood glucose meter kit and supplies KIT Check sugars fasting. E11.69 1 each 0   DROPLET PEN NEEDLES 31G X 5 MM MISC use as directed 100 each 2   Lancets (ONETOUCH DELICA PLUS LANCET33G) MISC USE TO CHECK BLOOD SUGAR MORNING, NOON AND BEDTIME 100 each 3   levothyroxine  (SYNTHROID ) 125 MCG tablet Take 1 tablet (125 mcg total) by mouth daily. 90 tablet 3   meloxicam  (MOBIC ) 15 MG tablet Take 1 tablet (15 mg total) by mouth daily. 30 tablet 1   Vitamin D , Ergocalciferol , (DRISDOL ) 1.25 MG (50000 UNIT) CAPS capsule Take 1 capsule (50,000 Units total) by mouth every 7 (seven) days. 12 capsule 0   No current facility-administered medications on file prior to visit.   Past Medical History:  Diagnosis Date   Asthma    Diabetes (HCC)    Dysmetabolic syndrome X    History of endometriosis    Hyperlipemia    Hypertension    Psoriasis    Sleep apnea    Thyroid  disease    Vitamin D  deficiency    Past Surgical History:  Procedure Laterality Date   ABDOMINAL HYSTERECTOMY     CHOLECYSTECTOMY  2011   COLONOSCOPY  05/27/2011   Minimal sigmoid diverticulosis. SMall internal hemorrhoids. Otherwise normal colonoscopy to terminal ileum   HYSTERECTOMY ABDOMINAL WITH SALPINGECTOMY     OOPHORECTOMY  1980   URETHRAL DILATION  03/2010   Dr Sallyanne    Family  History  Problem Relation Age of Onset   Alzheimer's disease Mother    Breast cancer Mother    Arthritis Mother    Diabetes Mother    Eczema Father    CAD Father    Diabetes Father    Parkinson's disease Father    Breast cancer Sister    Neurologic Disorder Brother    Glaucoma Maternal Grandmother    Heart disease Other    Social History   Socioeconomic History   Marital status: Married    Spouse name: Not on file   Number of children: 2   Years of education: Not on file   Highest education level: 12th grade  Occupational History   Occupation: homemaker  Tobacco Use   Smoking status: Never   Smokeless tobacco: Never  Vaping Use   Vaping status: Never Used  Substance and Sexual Activity   Alcohol use: Never   Drug use: Never   Sexual activity: Not on file  Other Topics Concern   Not on file  Social History Narrative   Not on file   Social Drivers of Health   Financial Resource Strain: Low Risk  (08/28/2023)   Overall Financial Resource Strain (CARDIA)    Difficulty of Paying Living Expenses: Not hard at all  Food Insecurity: No Food Insecurity (08/28/2023)   Hunger Vital Sign    Worried About Running Out of Food in the Last Year: Never true    Ran Out of Food in the Last Year: Never true  Transportation Needs: No Transportation Needs (08/28/2023)   PRAPARE - Administrator, Civil Service (Medical): No    Lack of Transportation (Non-Medical): No  Physical Activity: Inactive (08/28/2023)   Exercise Vital Sign    Days of Exercise per Week: 0 days    Minutes of Exercise per Session: Not on file  Stress: No Stress Concern Present (08/28/2023)   [employer] of Occupational Health - Occupational Stress Questionnaire    Feeling of Stress: Only a little  Social Connections: Moderately Integrated (08/28/2023)   Social Connection and Isolation Panel    Frequency of Communication with Friends and Family: Twice a week    Frequency of Social Gatherings with  Friends and Family: Once a week    Attends Religious Services: Patient declined    Database administrator or Organizations: Yes    Attends Engineer, structural: Patient declined    Marital Status: Married    Objective:  BP 110/60   Pulse 81   Temp 98 F (36.7 C)   Ht 5' 8 (1.727 m)   Wt 281 lb (127.5 kg)   SpO2 95%   BMI 42.73 kg/m      09/01/2023   10:19 AM 05/26/2023   10:12 AM 02/22/2023   11:00 AM  BP/Weight  Systolic BP 110 118 110  Diastolic BP 60 72 62  Wt. (Lbs) 281 279 278.4  BMI 42.73 kg/m2 42.42 kg/m2 42.33 kg/m2    Physical Exam Vitals reviewed.  Constitutional:      Appearance: Normal appearance. She is obese.  Neck:     Vascular: No carotid bruit.  Cardiovascular:     Rate and Rhythm: Normal rate and regular rhythm.     Heart sounds: Normal heart sounds.  Pulmonary:     Effort: Pulmonary effort is normal. No respiratory distress.     Breath sounds: Normal breath sounds.  Abdominal:     General: Abdomen is flat. Bowel sounds are normal.     Palpations: Abdomen is soft.     Tenderness: There is no abdominal tenderness.  Neurological:     Mental Status: She is alert and oriented to person, place, and time.  Psychiatric:        Mood and Affect: Mood normal.        Behavior: Behavior normal.      Diabetic foot exam was performed with the following findings:   No deformities, ulcerations, or other skin breakdown Normal sensation of 10g monofilament Intact posterior tibialis and dorsalis pedis pulses      Lab Results  Component Value Date   WBC 9.1 09/01/2023   HGB 14.0 09/01/2023   HCT 43.0 09/01/2023   PLT 225  09/01/2023   GLUCOSE 144 (H) 09/01/2023   CHOL 189 09/01/2023   TRIG 182 (H) 09/01/2023   HDL 38 (L) 09/01/2023   LDLCALC 119 (H) 09/01/2023   ALT 12 09/01/2023   AST 18 09/01/2023   NA 136 09/01/2023   K 3.7 09/01/2023   CL 94 (L) 09/01/2023   CREATININE 1.00 09/01/2023   BUN 30 (H) 09/01/2023   CO2 25 09/01/2023    TSH 7.560 (H) 05/26/2023   HGBA1C 7.1 (H) 09/01/2023   MICROALBUR 10 04/24/2020      Assessment & Plan:   Essential hypertension, benign Assessment & Plan: Hypertension well controlled.  Continue Valsartan /hydrochlorothiazide  320/25 mg once daily. Continue to work on eating a healthy diet and exercise.  Labs drawn today.   Orders: -     CBC with Differential/Platelet -     Comprehensive metabolic panel with GFR  Combined hyperlipidemia associated with type 2 diabetes mellitus (HCC) Assessment & Plan: Diabetes doing well.  A1c improved from 12% to 6.2%. Current insulin  regimen effective. Freestyle Libre not connected to clinic's system. Civil engineer, contracting Libre to clinic's system. - Continue current insulin  regimen. - restart mounjaro  at 2.5 mg weekly. Diabetes was improved on this low dose. Patient did not tolerate a higher dose. - check labs today.   Cholesterol is not at goal.  Intolerant to statins and fenofibrate.  Patient has aortic atherosclerosis.  Recommend repatha.    Orders: -     Hemoglobin A1c -     Lipid panel -     HumaLOG  KwikPen; Sliding scale using humalog  Kwikpen with max daily dose is 30 U.  Dispense: 6 mL; Refill: 1 -     Lantus  SoloStar; Inject 35 Units into the skin daily.  Dispense: 30 mL; Refill: 2 -     Tirzepatide ; Inject 2.5 mg into the skin once a week.  Dispense: 6 mL; Refill: 1  Other specified hypothyroidism Assessment & Plan: Previously well controlled Continue Synthroid  at current dose    Hypokalemia Assessment & Plan: Check lab.  Continue potassium chloride  at 10 meq 2 daily.   Orders: -     Potassium Chloride  Crys ER; Take 1 tablet (10 mEq total) by mouth 2 (two) times daily.  Dispense: 180 tablet; Refill: 0  Primary insomnia Assessment & Plan: Ambien  ineffective. Switching to Lunesta  2 mg as recommended for her age. - Switch from Ambien  to Lunesta  2 mg.  Orders: -     Eszopiclone ; Take 1 tablet (2 mg total) by  mouth at bedtime as needed for sleep. Take immediately before bedtime  Dispense: 30 tablet; Refill: 2  Psoriatic arthritis (HCC) Assessment & Plan: Management per specialist. The current medical regimen is effective;  continue present plan and medications.    Obstructive sleep apnea Assessment & Plan: Obstructive sleep apnea managed with CPAP therapy.  - Ensure continued CPAP use - Patient to coordinate with [facility] for CPAP supplies (new mask.)   Class 3 severe obesity due to excess calories with serious comorbidity and body mass index (BMI) of 40.0 to 44.9 in adult Assessment & Plan: Class 3 severe obesity due to excess calories and decreased physical activity.  Improving. Recommend continue to work on eating healthy diet and exercise.    Stage 3a chronic kidney disease (HCC) Assessment & Plan: Improved. Recommend avoid nonsteroid antiinflammatories, such as naproxen (aleve) or ibuprofen (advil.).        Meds ordered this encounter  Medications   insulin  lispro (HUMALOG  KWIKPEN)  200 UNIT/ML KwikPen    Sig: Sliding scale using humalog  Kwikpen with max daily dose is 30 U.    Dispense:  6 mL    Refill:  1   LANTUS  SOLOSTAR 100 UNIT/ML Solostar Pen    Sig: Inject 35 Units into the skin daily.    Dispense:  30 mL    Refill:  2   potassium chloride  (KLOR-CON  M) 10 MEQ tablet    Sig: Take 1 tablet (10 mEq total) by mouth 2 (two) times daily.    Dispense:  180 tablet    Refill:  0   eszopiclone  (LUNESTA ) 2 MG TABS tablet    Sig: Take 1 tablet (2 mg total) by mouth at bedtime as needed for sleep. Take immediately before bedtime    Dispense:  30 tablet    Refill:  2   tirzepatide  (MOUNJARO ) 2.5 MG/0.5ML Pen    Sig: Inject 2.5 mg into the skin once a week.    Dispense:  6 mL    Refill:  1    Orders Placed This Encounter  Procedures   CBC with Differential/Platelet   Comprehensive metabolic panel with GFR   Hemoglobin A1c   Lipid panel     Follow-up:  Return in about 3 months (around 12/02/2023) for chronic fasting.   I,Marla I Leal-Borjas,acting as a scribe for Abigail Free, MD.,have documented all relevant documentation on the behalf of Abigail Free, MD,as directed by  Abigail Free, MD while in the presence of Abigail Free, MD.   An After Visit Summary was printed and given to the patient.  I attest that I have reviewed this visit and agree with the plan scribed by my staff.   Abigail Free, MD [facility] [phone]

## 2023-09-01 ENCOUNTER — Other Ambulatory Visit: Payer: Self-pay | Admitting: Family Medicine

## 2023-09-01 ENCOUNTER — Encounter: Payer: Self-pay | Admitting: Family Medicine

## 2023-09-01 ENCOUNTER — Ambulatory Visit (INDEPENDENT_AMBULATORY_CARE_PROVIDER_SITE_OTHER): Admitting: Family Medicine

## 2023-09-01 VITALS — BP 110/60 | HR 81 | Temp 98.0°F | Ht 68.0 in | Wt 281.0 lb

## 2023-09-01 DIAGNOSIS — E876 Hypokalemia: Secondary | ICD-10-CM

## 2023-09-01 DIAGNOSIS — E782 Mixed hyperlipidemia: Secondary | ICD-10-CM | POA: Diagnosis not present

## 2023-09-01 DIAGNOSIS — G4733 Obstructive sleep apnea (adult) (pediatric): Secondary | ICD-10-CM

## 2023-09-01 DIAGNOSIS — Z6841 Body Mass Index (BMI) 40.0 and over, adult: Secondary | ICD-10-CM

## 2023-09-01 DIAGNOSIS — E66813 Obesity, class 3: Secondary | ICD-10-CM

## 2023-09-01 DIAGNOSIS — N1831 Chronic kidney disease, stage 3a: Secondary | ICD-10-CM | POA: Diagnosis not present

## 2023-09-01 DIAGNOSIS — L4050 Arthropathic psoriasis, unspecified: Secondary | ICD-10-CM | POA: Diagnosis not present

## 2023-09-01 DIAGNOSIS — E1159 Type 2 diabetes mellitus with other circulatory complications: Secondary | ICD-10-CM

## 2023-09-01 DIAGNOSIS — F5101 Primary insomnia: Secondary | ICD-10-CM

## 2023-09-01 DIAGNOSIS — R946 Abnormal results of thyroid function studies: Secondary | ICD-10-CM | POA: Diagnosis not present

## 2023-09-01 DIAGNOSIS — I10 Essential (primary) hypertension: Secondary | ICD-10-CM | POA: Diagnosis not present

## 2023-09-01 DIAGNOSIS — E1165 Type 2 diabetes mellitus with hyperglycemia: Secondary | ICD-10-CM

## 2023-09-01 DIAGNOSIS — E038 Other specified hypothyroidism: Secondary | ICD-10-CM

## 2023-09-01 DIAGNOSIS — E1169 Type 2 diabetes mellitus with other specified complication: Secondary | ICD-10-CM

## 2023-09-01 MED ORDER — LANTUS SOLOSTAR 100 UNIT/ML ~~LOC~~ SOPN: 35.0000 [IU] | PEN_INJECTOR | Freq: Every day | SUBCUTANEOUS | 2 refills | Status: DC

## 2023-09-01 MED ORDER — HUMALOG KWIKPEN 200 UNIT/ML ~~LOC~~ SOPN: PEN_INJECTOR | SUBCUTANEOUS | 1 refills | Status: DC

## 2023-09-01 MED ORDER — POTASSIUM CHLORIDE CRYS ER 10 MEQ PO TBCR: 10.0000 meq | EXTENDED_RELEASE_TABLET | Freq: Two times a day (BID) | ORAL | 0 refills | Status: DC

## 2023-09-01 MED ORDER — ESZOPICLONE 2 MG PO TABS: 2.0000 mg | ORAL_TABLET | Freq: Every evening | ORAL | 2 refills | Status: DC | PRN

## 2023-09-01 MED ORDER — TIRZEPATIDE 2.5 MG/0.5ML ~~LOC~~ SOAJ: 2.5000 mg | SUBCUTANEOUS | 1 refills | Status: DC

## 2023-09-01 NOTE — Patient Instructions (Addendum)
 Insomnia: Discontinue Ambien .  Start Lunesta 2 mg once daily at night.  Do not take these together.  Diabetes: Restart Mounjaro  2.5 mg weekly.  CHRONIC BACK PAIN: Your chronic back pain is likely due to arthritis. You have been using Tylenol and meloxicam  for pain management, but Advil is not recommended due to kidney concerns. -Discontinue using Advil. -Continue using Tylenol for pain management. -Avoid nonsteroid antiinflammatories, such as naproxen (aleve) or ibuprofen (advil.)  due to kidney function concerns.  Continue working on eating healthy and I would recommend working on some exercise.  Gentle walking versus recumbent bicycle versus water aerobics would be great!  Have a happy 4 July!  Dr. Sherre

## 2023-09-02 ENCOUNTER — Ambulatory Visit: Payer: Self-pay | Admitting: Family Medicine

## 2023-09-02 LAB — CBC WITH DIFFERENTIAL/PLATELET
Basophils Absolute: 0.1 10*3/uL (ref 0.0–0.2)
Basos: 1 %
EOS (ABSOLUTE): 0.2 10*3/uL (ref 0.0–0.4)
Eos: 2 %
Hematocrit: 43 % (ref 34.0–46.6)
Hemoglobin: 14 g/dL (ref 11.1–15.9)
Immature Grans (Abs): 0.1 10*3/uL (ref 0.0–0.1)
Immature Granulocytes: 1 %
Lymphocytes Absolute: 2.1 10*3/uL (ref 0.7–3.1)
Lymphs: 23 %
MCH: 30.5 pg (ref 26.6–33.0)
MCHC: 32.6 g/dL (ref 31.5–35.7)
MCV: 94 fL (ref 79–97)
Monocytes Absolute: 0.6 10*3/uL (ref 0.1–0.9)
Monocytes: 6 %
Neutrophils Absolute: 6.1 10*3/uL (ref 1.4–7.0)
Neutrophils: 67 %
Platelets: 225 10*3/uL (ref 150–450)
RBC: 4.59 x10E6/uL (ref 3.77–5.28)
RDW: 13.3 % (ref 11.7–15.4)
WBC: 9.1 10*3/uL (ref 3.4–10.8)

## 2023-09-02 LAB — COMPREHENSIVE METABOLIC PANEL WITH GFR
ALT: 12 IU/L (ref 0–32)
AST: 18 IU/L (ref 0–40)
Albumin: 4.1 g/dL (ref 3.8–4.8)
Alkaline Phosphatase: 95 IU/L (ref 44–121)
BUN/Creatinine Ratio: 30 — ABNORMAL HIGH (ref 12–28)
BUN: 30 mg/dL — ABNORMAL HIGH (ref 8–27)
Bilirubin Total: 0.6 mg/dL (ref 0.0–1.2)
CO2: 25 mmol/L (ref 20–29)
Calcium: 10.1 mg/dL (ref 8.7–10.3)
Chloride: 94 mmol/L — ABNORMAL LOW (ref 96–106)
Creatinine, Ser: 1 mg/dL (ref 0.57–1.00)
Globulin, Total: 3 g/dL (ref 1.5–4.5)
Glucose: 144 mg/dL — ABNORMAL HIGH (ref 70–99)
Potassium: 3.7 mmol/L (ref 3.5–5.2)
Sodium: 136 mmol/L (ref 134–144)
Total Protein: 7.1 g/dL (ref 6.0–8.5)
eGFR: 60 mL/min/{1.73_m2} (ref >59)

## 2023-09-02 LAB — LIPID PANEL
Chol/HDL Ratio: 5 ratio — ABNORMAL HIGH (ref 0.0–4.4)
Cholesterol, Total: 189 mg/dL (ref 100–199)
HDL: 38 mg/dL — ABNORMAL LOW (ref >39)
LDL Chol Calc (NIH): 119 mg/dL — ABNORMAL HIGH (ref 0–99)
Triglycerides: 182 mg/dL — ABNORMAL HIGH (ref 0–149)
VLDL Cholesterol Cal: 32 mg/dL (ref 5–40)

## 2023-09-02 LAB — HEMOGLOBIN A1C
Est. average glucose Bld gHb Est-mCnc: 157 mg/dL
Hgb A1c MFr Bld: 7.1 % — ABNORMAL HIGH (ref 4.8–5.6)

## 2023-09-03 ENCOUNTER — Other Ambulatory Visit: Payer: Self-pay | Admitting: Family Medicine

## 2023-09-03 DIAGNOSIS — E1165 Type 2 diabetes mellitus with hyperglycemia: Secondary | ICD-10-CM

## 2023-09-04 DIAGNOSIS — N1831 Chronic kidney disease, stage 3a: Secondary | ICD-10-CM | POA: Insufficient documentation

## 2023-09-04 NOTE — Assessment & Plan Note (Signed)
 Class 3 severe obesity due to excess calories and decreased physical activity.  Improving. Recommend continue to work on eating healthy diet and exercise.

## 2023-09-04 NOTE — Assessment & Plan Note (Signed)
 Previously well controlled Continue Synthroid at current dose

## 2023-09-04 NOTE — Assessment & Plan Note (Signed)
Management per specialist The current medical regimen is effective;  continue present plan and medications.  

## 2023-09-04 NOTE — Assessment & Plan Note (Signed)
 Obstructive sleep apnea managed with CPAP therapy.  - Ensure continued CPAP use - Patient to coordinate with [facility] for CPAP supplies (new mask.)

## 2023-09-04 NOTE — Assessment & Plan Note (Signed)
 Diabetes doing well.  A1c improved from 12% to 6.2%. Current insulin  regimen effective. Freestyle Libre not connected to clinic's system. Civil engineer, contracting Libre to clinic's system. - Continue current insulin  regimen. - restart mounjaro  at 2.5 mg weekly. Diabetes was improved on this low dose. Patient did not tolerate a higher dose. - check labs today.   Cholesterol is not at goal.  Intolerant to statins and fenofibrate.  Patient has aortic atherosclerosis.  Recommend repatha.

## 2023-09-04 NOTE — Assessment & Plan Note (Signed)
 Check lab.  Continue potassium chloride  at 10 meq 2 daily.

## 2023-09-04 NOTE — Assessment & Plan Note (Signed)
 Improved. Recommend avoid nonsteroid antiinflammatories, such as naproxen (aleve) or ibuprofen (advil.).

## 2023-09-04 NOTE — Assessment & Plan Note (Signed)
 Ambien  ineffective. Switching to Lunesta  2 mg as recommended for her age. - Switch from Ambien  to Lunesta  2 mg.

## 2023-09-04 NOTE — Assessment & Plan Note (Signed)
 Hypertension well controlled.  Continue Valsartan /hydrochlorothiazide  320/25 mg once daily. Continue to work on eating a healthy diet and exercise.  Labs drawn today.

## 2023-09-06 ENCOUNTER — Other Ambulatory Visit: Payer: Self-pay

## 2023-09-06 DIAGNOSIS — E038 Other specified hypothyroidism: Secondary | ICD-10-CM

## 2023-09-07 DIAGNOSIS — G4733 Obstructive sleep apnea (adult) (pediatric): Secondary | ICD-10-CM | POA: Diagnosis not present

## 2023-09-09 LAB — TSH: TSH: 6.87 u[IU]/mL — ABNORMAL HIGH (ref 0.450–4.500)

## 2023-09-09 LAB — SPECIMEN STATUS REPORT

## 2023-09-13 ENCOUNTER — Other Ambulatory Visit: Payer: Self-pay

## 2023-09-13 DIAGNOSIS — E038 Other specified hypothyroidism: Secondary | ICD-10-CM

## 2023-09-13 MED ORDER — LEVOTHYROXINE SODIUM 137 MCG PO TABS: 137.0000 ug | ORAL_TABLET | Freq: Every day | ORAL | 0 refills | Status: DC

## 2023-09-13 MED ORDER — LEVOTHYROXINE SODIUM 125 MCG PO TABS: 125.0000 ug | ORAL_TABLET | Freq: Every day | ORAL | 3 refills | Status: DC

## 2023-10-05 ENCOUNTER — Other Ambulatory Visit: Payer: Self-pay | Admitting: Family Medicine

## 2023-10-05 DIAGNOSIS — E1169 Type 2 diabetes mellitus with other specified complication: Secondary | ICD-10-CM

## 2023-10-20 DIAGNOSIS — G4733 Obstructive sleep apnea (adult) (pediatric): Secondary | ICD-10-CM | POA: Diagnosis not present

## 2023-11-07 ENCOUNTER — Other Ambulatory Visit: Payer: Self-pay | Admitting: Family Medicine

## 2023-11-07 DIAGNOSIS — E559 Vitamin D deficiency, unspecified: Secondary | ICD-10-CM

## 2023-11-08 ENCOUNTER — Other Ambulatory Visit: Payer: Self-pay | Admitting: Family Medicine

## 2023-11-08 DIAGNOSIS — E1169 Type 2 diabetes mellitus with other specified complication: Secondary | ICD-10-CM

## 2023-11-19 DIAGNOSIS — G4733 Obstructive sleep apnea (adult) (pediatric): Secondary | ICD-10-CM | POA: Diagnosis not present

## 2023-12-03 ENCOUNTER — Encounter: Payer: Self-pay | Admitting: Family Medicine

## 2023-12-06 ENCOUNTER — Other Ambulatory Visit: Payer: Self-pay | Admitting: Family Medicine

## 2023-12-06 MED ORDER — ZOLPIDEM TARTRATE 10 MG PO TABS: 10.0000 mg | ORAL_TABLET | Freq: Every evening | ORAL | 5 refills | Status: AC | PRN

## 2023-12-09 ENCOUNTER — Other Ambulatory Visit: Payer: Self-pay | Admitting: Family Medicine

## 2023-12-09 DIAGNOSIS — L299 Pruritus, unspecified: Secondary | ICD-10-CM | POA: Diagnosis not present

## 2023-12-09 DIAGNOSIS — L400 Psoriasis vulgaris: Secondary | ICD-10-CM | POA: Diagnosis not present

## 2023-12-13 DIAGNOSIS — M1712 Unilateral primary osteoarthritis, left knee: Secondary | ICD-10-CM | POA: Diagnosis not present

## 2023-12-14 DIAGNOSIS — Z79890 Hormone replacement therapy (postmenopausal): Secondary | ICD-10-CM | POA: Diagnosis not present

## 2023-12-14 DIAGNOSIS — K5900 Constipation, unspecified: Secondary | ICD-10-CM | POA: Diagnosis not present

## 2023-12-14 DIAGNOSIS — E079 Disorder of thyroid, unspecified: Secondary | ICD-10-CM | POA: Diagnosis not present

## 2023-12-14 DIAGNOSIS — Z79899 Other long term (current) drug therapy: Secondary | ICD-10-CM | POA: Diagnosis not present

## 2023-12-14 DIAGNOSIS — Z7984 Long term (current) use of oral hypoglycemic drugs: Secondary | ICD-10-CM | POA: Diagnosis not present

## 2023-12-14 DIAGNOSIS — K5641 Fecal impaction: Secondary | ICD-10-CM | POA: Diagnosis not present

## 2023-12-14 DIAGNOSIS — I10 Essential (primary) hypertension: Secondary | ICD-10-CM | POA: Diagnosis not present

## 2023-12-14 DIAGNOSIS — E119 Type 2 diabetes mellitus without complications: Secondary | ICD-10-CM | POA: Diagnosis not present

## 2023-12-16 NOTE — Assessment & Plan Note (Addendum)
  Orders:   VITAMIN D  25 Hydroxy (Vit-D Deficiency, Fractures)

## 2023-12-16 NOTE — Assessment & Plan Note (Deleted)
 Occasional medication use for arthritis.

## 2023-12-16 NOTE — Assessment & Plan Note (Addendum)
 SABRA

## 2023-12-16 NOTE — Assessment & Plan Note (Deleted)
 Obstructive sleep apnea managed with CPAP therapy. She is compliant and reports sleeping well. Prefers to continue with current durable medical equipment provider, [facility]. - Ensure continued CPAP use - Coordinate with [facility] for CPAP supplies

## 2023-12-16 NOTE — Assessment & Plan Note (Addendum)
  Orders:   POCT Lipid Panel   Comprehensive metabolic panel with GFR

## 2023-12-16 NOTE — Assessment & Plan Note (Addendum)
 Blood pressure well-controlled. - Continue current antihypertensive medication regimen. Continue valsartan  hydrochlorothiazide  20/25 mg daily

## 2023-12-16 NOTE — Assessment & Plan Note (Deleted)
 Improved. Recommend avoid nonsteroid antiinflammatories, such as naproxen (aleve) or ibuprofen (advil.).

## 2023-12-16 NOTE — Assessment & Plan Note (Addendum)
 Hypothyroidism managed with medication. Thyroid  levels were previously off, and she is nearing the end of her current prescription. Rechecking thyroid  function is necessary to adjust medication appropriately. - Order thyroid  function tests to assess current thyroid  levels Orders:   T4, free   TSH

## 2023-12-16 NOTE — Progress Notes (Signed)
 Subjective:  Patient ID: Whitney Eaton, female    DOB: [birth year]  Age: 73 y.o. MRN: [id number]  Chief Complaint  Patient presents with   Medical Management of Chronic Issues    Needs breztri  and albuterol  refilled.    HPI: Discussed the use of AI scribe software for clinical note transcription with the patient, who gave verbal consent to proceed.  History of Present Illness Whitney Eaton is Eaton 73 year old female with diabetes and knee osteoarthritis who presents for Eaton regular follow-up visit.  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 identified on abdominal x-ray during recent ER visit - Undergoing spinal decompression therapy with chiropractor - Spinal condition limits ability to walk long distances  Glycemic control and diabetes management - Improved glycemic control with current A1c at 6.0 - Adheres to strict diet, including oatmeal or eggs for breakfast and avoidance of high-sodium deli meats - No longer requires Humalog ; currently using 35 units of Lantus  daily - Occasional sensor errors with glucose monitor resulting in false low readings, confirmed with finger stick testing, but overall benefits by knowledge of glucoses from CGM  Dyslipidemia - Cholesterol levels improved: triglycerides decreased from 182 to 85, LDL from 119 to 97, HDL increased from 38 to 56 - Improvements attributed to dietary changes and increased physical activity within the home  Depressive symptoms and psychosocial function - History of  depression, worsened in 2022 due to personal losses and health challenges, resulting in inactivity and poor self-care - Gradual improvement in physical and mental health with resumption of church attendance  Hypertension - Continues valsartan  hydrochlorothiazide  20/25 mg daily for blood pressure management  Psoriatic arthritis - Continues medication for psoriatic arthritis as needed              12/25/2023    8:05 PM 09/01/2023   10:22 AM 05/26/2023   10:15 AM 02/08/2023    2:43 PM 02/19/2022    8:26 AM  Depression screen PHQ 2/9  Decreased Interest 0 0 0 0 1  Down, Depressed, Hopeless 0 0 0 0 1  PHQ - 2 Score 0 0 0 0 2  Altered sleeping 0   0 2  Tired, decreased energy 0   0 3  Change in appetite 0   0 1  Feeling bad or failure about yourself  0   0 1  Trouble concentrating 0   0 0  Moving slowly or fidgety/restless 0   0 0  Suicidal thoughts 0   0 0  PHQ-9 Score 0   0 9  Difficult doing work/chores    Not difficult at all Somewhat difficult        09/01/2023   10:22 AM  Fall Risk   Falls in the past year? 1  Number falls in past yr: 0  Injury with Fall? 0  Risk for fall due to : Impaired balance/gait  Follow up Falls evaluation completed    Patient Care Team: Whitney Clapper, MD as PCP - General (Family Medicine) Whitney Eaton, DC as Referring Physician (Chiropractic Medicine) Whitney Race, PA-C (Dermatology) Whitney Eaton, OD (  Optometry) Whitney Domino, FNP as Nurse Practitioner (Urology) Whitney Eaton, OD (Optometry)   Review of Systems  Constitutional:  Negative for chills, fatigue and fever.  HENT:  Negative for congestion, ear pain and sore throat.   Respiratory:  Negative for cough and shortness of breath.   Cardiovascular:  Negative for chest pain.  Gastrointestinal:  Negative for abdominal pain, constipation, diarrhea, nausea and vomiting.  Genitourinary:  Negative for dysuria and urgency.  Musculoskeletal:  Positive for arthralgias. Negative for  myalgias.  Skin:  Negative for rash.  Neurological:  Negative for dizziness and headaches.  Psychiatric/Behavioral:  Negative for dysphoric mood. The patient is not nervous/anxious.     Current Outpatient Medications on File Prior to Visit  Medication Sig Dispense Refill   BD PEN NEEDLE SHORT ULTRAFINE 31G X 8 MM MISC USE AS DIRECTED 100 each 2   bimekizumab-bkzx (BIMZELX) 160 MG/ML prefilled syringe Inject 160 mg into the skin every 30 (thirty) days.     blood glucose meter kit and supplies KIT Check sugars fasting. E11.69 1 each 0   Lancets (ONETOUCH DELICA PLUS LANCET33G) MISC USE TO CHECK BLOOD SUGAR MORNING, NOON AND BEDTIME 100 each 3   LANTUS  SOLOSTAR 100 UNIT/ML Solostar Pen INJECT 35 UNITS INTO THE SKIN DAILY. 31.5 mL 0   valsartan -hydrochlorothiazide  (DIOVAN -HCT) 320-25 MG tablet TAKE 1 TABLET BY MOUTH EVERY DAY 90 tablet 1   Vitamin D , Ergocalciferol , (DRISDOL ) 1.25 MG (50000 UNIT) CAPS capsule TAKE 1 CAPSULE (50,000 UNITS TOTAL) BY MOUTH EVERY 7 (SEVEN) DAYS 12 capsule 1   zolpidem  (AMBIEN ) 10 MG tablet Take 1 tablet (10 mg total) by mouth at bedtime as needed for sleep. 30 tablet 5   No current facility-administered medications on file prior to visit.   Past Medical History:  Diagnosis Date   Asthma    Diabetes (HCC)    Dysmetabolic syndrome X    History of endometriosis    Hyperlipemia    Hypertension    Psoriasis    Sleep apnea    Thyroid  disease    Vitamin D  deficiency    Past Surgical History:  Procedure Laterality Date   ABDOMINAL HYSTERECTOMY     CHOLECYSTECTOMY  2011   COLONOSCOPY  05/27/2011   Minimal sigmoid diverticulosis. SMall internal hemorrhoids. Otherwise normal colonoscopy to terminal ileum   HYSTERECTOMY ABDOMINAL WITH SALPINGECTOMY     OOPHORECTOMY  1980   URETHRAL DILATION  03/2010   Dr Sallyanne    Family History  Problem Relation Age of Onset   Alzheimer's disease Mother    Breast cancer Mother    Arthritis Mother    Diabetes Mother     Eczema Father    CAD Father    Diabetes Father    Parkinson's disease Father    Breast cancer Sister    Neurologic Disorder Brother    Glaucoma Maternal Grandmother    Heart disease Other    Social History   Socioeconomic History   Marital status: Married    Spouse name: Not on file   Number of children: 2   Years of education: Not on file   Highest education level: 12th grade  Occupational History   Occupation: homemaker  Tobacco Use   Smoking status: Never   Smokeless tobacco: Never  Vaping Use   Vaping status: Never Used  Substance and Sexual Activity   Alcohol use: Never   Drug use: Never   Sexual activity: Not on file  Other Topics Concern   Not on  file  Social History Narrative   Not on file   Social Drivers of Health   Financial Resource Strain: Low Risk  (08/28/2023)   Overall Financial Resource Strain (CARDIA)    Difficulty of Paying Living Expenses: Not hard at all  Food Insecurity: No Food Insecurity (08/28/2023)   Hunger Vital Sign    Worried About Running Out of Food in the Last Year: Never true    Ran Out of Food in the Last Year: Never true  Transportation Needs: No Transportation Needs (08/28/2023)   PRAPARE - Administrator, Civil Service (Medical): No    Lack of Transportation (Non-Medical): No  Physical Activity: Inactive (08/28/2023)   Exercise Vital Sign    Days of Exercise per Week: 0 days    Minutes of Exercise per Session: Not on file  Stress: No Stress Concern Present (08/28/2023)   [employer] of Occupational Health - Occupational Stress Questionnaire    Feeling of Stress: Only Eaton little  Social Connections: Moderately Integrated (08/28/2023)   Social Connection and Isolation Panel    Frequency of Communication with Friends and Family: Twice Eaton week    Frequency of Social Gatherings with Friends and Family: Once Eaton week    Attends Religious Services: Patient declined    Database Administrator or Organizations: Yes    Attends  Engineer, Structural: Patient declined    Marital Status: Married    Objective:  BP 136/78   Pulse 62   Temp 98.3 F (36.8 C)   Ht 5' 8 (1.727 m)   Wt 278 lb (126.1 kg)   SpO2 90%   BMI 42.27 kg/m      12/17/2023   10:12 AM 09/01/2023   10:19 AM 05/26/2023   10:12 AM  BP/Weight  Systolic BP 136 110 118  Diastolic BP 78 60 72  Wt. (Lbs) 278 281 279  BMI 42.27 kg/m2 42.73 kg/m2 42.42 kg/m2    Physical Exam Vitals reviewed.  Constitutional:      Appearance: Normal appearance. She is obese.  Neck:     Vascular: No carotid bruit.  Cardiovascular:     Rate and Rhythm: Normal rate and regular rhythm.     Pulses: Normal pulses.     Heart sounds: Normal heart sounds.  Pulmonary:     Effort: Pulmonary effort is normal. No respiratory distress.     Breath sounds: Normal breath sounds.  Abdominal:     General: Abdomen is flat. Bowel sounds are normal.     Palpations: Abdomen is soft.     Tenderness: There is no abdominal tenderness.  Neurological:     Mental Status: She is alert and oriented to person, place, and time.  Psychiatric:        Mood and Affect: Mood normal.        Behavior: Behavior normal.      Diabetic foot exam was performed with the following findings:   No deformities, ulcerations, or other skin breakdown Normal sensation of 10g monofilament Intact posterior tibialis and dorsalis pedis pulses      Lab Results  Component Value Date   WBC 9.1 09/01/2023   HGB 14.0 09/01/2023   HCT 43.0 09/01/2023   PLT 225 09/01/2023   GLUCOSE 77 12/17/2023   CHOL 189 09/01/2023   TRIG 182 (H) 09/01/2023   HDL 38 (L) 09/01/2023   LDLCALC 119 (H) 09/01/2023   ALT 14 12/17/2023   AST 23 12/17/2023   NA 131 (L)  12/17/2023   K 3.7 12/17/2023   CL 91 (L) 12/17/2023   CREATININE 0.73 12/17/2023   BUN 17 12/17/2023   CO2 27 12/17/2023   TSH 4.390 12/17/2023   HGBA1C 6.0 12/17/2023    Results for orders placed or performed in visit on 12/17/23   POCT glycosylated hemoglobin (Hb A1C)   Collection Time: 12/17/23 10:31 AM  Result Value Ref Range   Hemoglobin A1C     HbA1c POC (<> result, manual entry) 6.0 4.0 - 5.6 %   HbA1c, POC (prediabetic range)     HbA1c, POC (controlled diabetic range)    POCT Lipid Panel   Collection Time: 12/17/23 10:33 AM  Result Value Ref Range   TC 170    HDL 56    TRG 85    LDL 97    Non-HDL 114    TC/HDL    Comprehensive metabolic panel with GFR   Collection Time: 12/17/23 11:25 AM  Result Value Ref Range   Glucose 77 70 - 99 mg/dL   BUN 17 8 - 27 mg/dL   Creatinine, Ser 9.26 0.57 - 1.00 mg/dL   eGFR 87 >40 fO/fpw/8.26   BUN/Creatinine Ratio 23 12 - 28   Sodium 131 (L) 134 - 144 mmol/L   Potassium 3.7 3.5 - 5.2 mmol/L   Chloride 91 (L) 96 - 106 mmol/L   CO2 27 20 - 29 mmol/L   Calcium  9.7 8.7 - 10.3 mg/dL   Total Protein 7.2 6.0 - 8.5 g/dL   Albumin 4.3 3.8 - 4.8 g/dL   Globulin, Total 2.9 1.5 - 4.5 g/dL   Bilirubin Total 0.6 0.0 - 1.2 mg/dL   Alkaline Phosphatase 88 49 - 135 IU/L   AST 23 0 - 40 IU/L   ALT 14 0 - 32 IU/L  T4, free   Collection Time: 12/17/23 11:25 AM  Result Value Ref Range   Free T4 1.65 0.82 - 1.77 ng/dL  TSH   Collection Time: 12/17/23 11:25 AM  Result Value Ref Range   TSH 4.390 0.450 - 4.500 uIU/mL  VITAMIN D  25 Hydroxy (Vit-D Deficiency, Fractures)   Collection Time: 12/17/23 11:25 AM  Result Value Ref Range   Vit D, 25-Hydroxy 44.7 30.0 - 100.0 ng/mL  .  Assessment & Plan:   Assessment & Plan Type 2 diabetes mellitus with stage 2 chronic kidney disease, with long-term current use of insulin  (HCC) Diabetes well-controlled with A1c of 6. Freestyle Libre sensor issues noted.  - No recent Humalog  use due to stable levels. Discontinue Humalog . - Consider reducing Lantus  to 32 units if true hypoglycemia occurs. - Continue monitoring blood sugar with Freestyle Libre and confirm with finger stick if necessary. Clearly benefiting from use.  - CKD stage 2  stable.   Orders:   POCT glycosylated hemoglobin (Hb A1C)  Other specified hypothyroidism Hypothyroidism managed with medication. Thyroid  levels were previously off, and she is nearing the end of her current prescription. Rechecking thyroid  function is necessary to adjust medication appropriately. - Order thyroid  function tests to assess current thyroid  levels Orders:   T4, free   TSH   Essential hypertension, benign Blood pressure well-controlled. - Continue current antihypertensive medication regimen. Continue valsartan  hydrochlorothiazide  20/25 mg daily    Mixed hyperlipidemia Significant lipid profile improvement with lifestyle changes. - Continue current lifestyle modifications and medication regimen. Orders:   POCT Lipid Panel   Comprehensive metabolic panel with GFR  Statin myopathy Intolerant to statins.     Vitamin D  insufficiency  Check levels. Orders:   VITAMIN D  25 Hydroxy (Vit-D Deficiency, Fractures)   Mild intermittent asthma without complication Start on airsupra instead of albuterol .  Orders:   Albuterol -Budesonide (AIRSUPRA) 90-80 MCG/ACT AERO; Inhale 2 puffs into the lungs every 4 (four) hours as needed.  Primary osteoarthritis of right knee Right knee osteoarthritis, bone-on-bone Severe osteoarthritis confirmed by x-ray. Discussed potential knee replacement surgery contingent on weight loss. - Pursue weight loss to reduce BMI below 40. - Initiate home physical therapy for knee muscle strengthening.    Screen for colon cancer  Orders:   Ambulatory referral to Gastroenterology  Body mass index is 42.27 kg/m.   Meds ordered this encounter  Medications   Albuterol -Budesonide (AIRSUPRA) 90-80 MCG/ACT AERO    Sig: Inhale 2 puffs into the lungs every 4 (four) hours as needed.    Dispense:  10.7 g    Refill:  1    Orders Placed This Encounter  Procedures   Comprehensive metabolic panel with GFR   T4, free   TSH   VITAMIN D  25 Hydroxy (Vit-D  Deficiency, Fractures)   Ambulatory referral to Gastroenterology   POCT glycosylated hemoglobin (Hb A1C)   POCT Lipid Panel    Follow-up: Return in about 3 months (around 03/18/2024).   I,Marla I Leal-Borjas,acting as Eaton scribe for Abigail Free, MD.,have documented all relevant documentation on the behalf of Abigail Free, MD,as directed by  Abigail Free, MD while in the presence of Abigail Free, MD.   An After Visit Summary was printed and given to the patient.  I attest that I have reviewed this visit and agree with the plan scribed by my staff.   Abigail Free, MD [facility] [phone]

## 2023-12-17 ENCOUNTER — Ambulatory Visit: Admitting: Family Medicine

## 2023-12-17 ENCOUNTER — Ambulatory Visit: Payer: Self-pay | Admitting: Family Medicine

## 2023-12-17 ENCOUNTER — Ambulatory Visit
Admission: RE | Admit: 2023-12-17 | Discharge: 2023-12-17 | Disposition: A | Discharge status: Home / Self Care | Source: Ambulatory Visit | Attending: Family Medicine | Admitting: Family Medicine

## 2023-12-17 ENCOUNTER — Encounter: Payer: Self-pay | Admitting: Family Medicine

## 2023-12-17 VITALS — BP 136/78 | HR 62 | Temp 98.3°F | Ht 68.0 in | Wt 278.0 lb

## 2023-12-17 DIAGNOSIS — Z1211 Encounter for screening for malignant neoplasm of colon: Secondary | ICD-10-CM | POA: Diagnosis not present

## 2023-12-17 DIAGNOSIS — T466X5A Adverse effect of antihyperlipidemic and antiarteriosclerotic drugs, initial encounter: Secondary | ICD-10-CM | POA: Diagnosis not present

## 2023-12-17 DIAGNOSIS — G720 Drug-induced myopathy: Secondary | ICD-10-CM | POA: Diagnosis not present

## 2023-12-17 DIAGNOSIS — E559 Vitamin D deficiency, unspecified: Secondary | ICD-10-CM

## 2023-12-17 DIAGNOSIS — E038 Other specified hypothyroidism: Secondary | ICD-10-CM

## 2023-12-17 DIAGNOSIS — E782 Mixed hyperlipidemia: Secondary | ICD-10-CM | POA: Diagnosis not present

## 2023-12-17 DIAGNOSIS — E1122 Type 2 diabetes mellitus with diabetic chronic kidney disease: Secondary | ICD-10-CM

## 2023-12-17 DIAGNOSIS — I10 Essential (primary) hypertension: Secondary | ICD-10-CM | POA: Diagnosis not present

## 2023-12-17 DIAGNOSIS — J4520 Mild intermittent asthma, uncomplicated: Secondary | ICD-10-CM

## 2023-12-17 DIAGNOSIS — G8929 Other chronic pain: Secondary | ICD-10-CM

## 2023-12-17 DIAGNOSIS — N182 Chronic kidney disease, stage 2 (mild): Secondary | ICD-10-CM | POA: Diagnosis not present

## 2023-12-17 DIAGNOSIS — Z1231 Encounter for screening mammogram for malignant neoplasm of breast: Secondary | ICD-10-CM

## 2023-12-17 DIAGNOSIS — M1711 Unilateral primary osteoarthritis, right knee: Secondary | ICD-10-CM

## 2023-12-17 DIAGNOSIS — Z0000 Encounter for general adult medical examination without abnormal findings: Secondary | ICD-10-CM

## 2023-12-17 DIAGNOSIS — N1831 Chronic kidney disease, stage 3a: Secondary | ICD-10-CM

## 2023-12-17 DIAGNOSIS — F321 Major depressive disorder, single episode, moderate: Secondary | ICD-10-CM

## 2023-12-17 DIAGNOSIS — G4733 Obstructive sleep apnea (adult) (pediatric): Secondary | ICD-10-CM

## 2023-12-17 DIAGNOSIS — Z794 Long term (current) use of insulin: Secondary | ICD-10-CM | POA: Diagnosis not present

## 2023-12-17 DIAGNOSIS — L4050 Arthropathic psoriasis, unspecified: Secondary | ICD-10-CM

## 2023-12-17 LAB — POCT LIPID PANEL
HDL: 56
LDL: 97
Non-HDL: 114
TC: 170
TRG: 85

## 2023-12-17 LAB — POCT GLYCOSYLATED HEMOGLOBIN (HGB A1C): HbA1c POC (<> result, manual entry): 6 % (ref 4.0–5.6)

## 2023-12-17 MED ORDER — AIRSUPRA 90-80 MCG/ACT IN AERO: 2.0000 | INHALATION_SPRAY | RESPIRATORY_TRACT | 1 refills | Status: DC | PRN

## 2023-12-17 NOTE — Patient Instructions (Signed)
  VISIT SUMMARY: Today, we reviewed your ongoing health conditions, including diabetes, knee osteoarthritis, spine issues, cholesterol levels, and hypertension. We discussed your progress and made some adjustments to your treatment plan.  YOUR PLAN: TYPE 2 DIABETES MELLITUS WITH DIABETIC CHRONIC KIDNEY DISEASE, STAGE 3A: Your diabetes is well-controlled with an A1c of 6.0. You have had some issues with your glucose monitor giving false low readings. -Discontinue Humalog . -Consider reducing Lantus  to 32 units if you experience true low blood sugar. -Continue monitoring your blood sugar with the [facility] and confirm with finger stick tests if necessary.  RIGHT KNEE OSTEOARTHRITIS, BONE-ON-BONE: You have severe osteoarthritis in your right knee, which has significantly reduced your mobility. -Pursue weight loss to reduce your BMI below 40. -Start physical therapy to strengthen the muscles around your knee.  ADVANCED DEGENERATIVE CHANGES OF THORACOLUMBAR SPINE: You have advanced degenerative changes in your spine, which limit your ability to walk long distances. -Continue spinal decompression therapy with your chiropractor.  ESSENTIAL HYPERTENSION: Your blood pressure is well-controlled with your current medication. -Continue taking your current blood pressure medications.  MIXED HYPERLIPIDEMIA: Your cholesterol levels have improved significantly due to lifestyle changes. -Continue with your current lifestyle modifications and medication regimen.  DEPRESSION: Your mood and activity levels have improved. -Continue engaging in activities that improve your mood and physical health.                      Contains text generated by [employer].                                 Contains text generated by [employer].

## 2023-12-17 NOTE — Assessment & Plan Note (Addendum)
 Start on airsupra instead of albuterol .  Orders:   Albuterol -Budesonide (AIRSUPRA) 90-80 MCG/ACT AERO; Inhale 2 puffs into the lungs every 4 (four) hours as needed.

## 2023-12-17 NOTE — Assessment & Plan Note (Addendum)
 Diabetes well-controlled with A1c of 6. Freestyle Libre sensor issues noted.  - No recent Humalog  use due to stable levels. Discontinue Humalog . - Consider reducing Lantus  to 32 units if true hypoglycemia occurs. - Continue monitoring blood sugar with Freestyle Libre and confirm with finger stick if necessary. Clearly benefiting from use.  - CKD stage 2 stable.   Orders:   POCT glycosylated hemoglobin (Hb A1C)

## 2023-12-18 DIAGNOSIS — Z0000 Encounter for general adult medical examination without abnormal findings: Secondary | ICD-10-CM | POA: Insufficient documentation

## 2023-12-18 DIAGNOSIS — F321 Major depressive disorder, single episode, moderate: Secondary | ICD-10-CM | POA: Insufficient documentation

## 2023-12-18 DIAGNOSIS — G8929 Other chronic pain: Secondary | ICD-10-CM | POA: Insufficient documentation

## 2023-12-18 DIAGNOSIS — M1711 Unilateral primary osteoarthritis, right knee: Secondary | ICD-10-CM | POA: Insufficient documentation

## 2023-12-18 LAB — COMPREHENSIVE METABOLIC PANEL WITH GFR
ALT: 14 IU/L (ref 0–32)
AST: 23 IU/L (ref 0–40)
Albumin: 4.3 g/dL (ref 3.8–4.8)
Alkaline Phosphatase: 88 IU/L (ref 49–135)
BUN/Creatinine Ratio: 23 (ref 12–28)
BUN: 17 mg/dL (ref 8–27)
Bilirubin Total: 0.6 mg/dL (ref 0.0–1.2)
CO2: 27 mmol/L (ref 20–29)
Calcium: 9.7 mg/dL (ref 8.7–10.3)
Chloride: 91 mmol/L — ABNORMAL LOW (ref 96–106)
Creatinine, Ser: 0.73 mg/dL (ref 0.57–1.00)
Globulin, Total: 2.9 g/dL (ref 1.5–4.5)
Glucose: 77 mg/dL (ref 70–99)
Potassium: 3.7 mmol/L (ref 3.5–5.2)
Sodium: 131 mmol/L — ABNORMAL LOW (ref 134–144)
Total Protein: 7.2 g/dL (ref 6.0–8.5)
eGFR: 87 mL/min/1.73 (ref >59)

## 2023-12-18 LAB — T4, FREE: Free T4: 1.65 ng/dL (ref 0.82–1.77)

## 2023-12-18 LAB — TSH: TSH: 4.39 u[IU]/mL (ref 0.450–4.500)

## 2023-12-18 LAB — VITAMIN D 25 HYDROXY (VIT D DEFICIENCY, FRACTURES): Vit D, 25-Hydroxy: 44.7 ng/mL (ref 30.0–100.0)

## 2023-12-18 NOTE — Assessment & Plan Note (Deleted)
 Advanced degenerative changes of thoracolumbar spine Advanced degenerative changes limiting mobility. Undergoing spinal decompression therapy. - Continue spinal decompression therapy with chiropractor.

## 2023-12-18 NOTE — Assessment & Plan Note (Deleted)
 Positive lifestyle changes noted. - Continue current lifestyle modifications, including diet and exercise.

## 2023-12-18 NOTE — Assessment & Plan Note (Addendum)
 Right knee osteoarthritis, bone-on-bone Severe osteoarthritis confirmed by x-ray. Discussed potential knee replacement surgery contingent on weight loss. - Pursue weight loss to reduce BMI below 40. - Initiate home physical therapy for knee muscle strengthening.

## 2023-12-18 NOTE — Assessment & Plan Note (Deleted)
 Improvement in mood and activity levels.

## 2023-12-20 ENCOUNTER — Other Ambulatory Visit: Payer: Self-pay | Admitting: Family Medicine

## 2023-12-20 ENCOUNTER — Ambulatory Visit (INDEPENDENT_AMBULATORY_CARE_PROVIDER_SITE_OTHER)

## 2023-12-20 ENCOUNTER — Other Ambulatory Visit: Payer: Self-pay

## 2023-12-20 DIAGNOSIS — Z23 Encounter for immunization: Secondary | ICD-10-CM | POA: Diagnosis not present

## 2023-12-20 MED ORDER — LEVOTHYROXINE SODIUM 137 MCG PO TABS: 137.0000 ug | ORAL_TABLET | Freq: Every day | ORAL | 0 refills | Status: DC

## 2023-12-20 NOTE — Telephone Encounter (Signed)
 Patient can take generic. Rx re sent.

## 2023-12-22 ENCOUNTER — Ambulatory Visit: Payer: Self-pay | Admitting: Family Medicine

## 2023-12-27 ENCOUNTER — Telehealth: Payer: Self-pay

## 2023-12-27 MED ORDER — MELOXICAM 15 MG PO TABS: 15.0000 mg | ORAL_TABLET | Freq: Every day | ORAL | 1 refills | Status: DC

## 2023-12-27 NOTE — Addendum Note (Signed)
 Addended by: FOREST BOWLING A on: 12/27/2023 03:05 PM   Modules accepted: Orders

## 2023-12-27 NOTE — Telephone Encounter (Addendum)
 Per Dr. Sherre patient can take tylenol pm at night with ambien . Ok to to send in Meloxicam  15mg .  Patient aware.

## 2023-12-27 NOTE — Telephone Encounter (Addendum)
 Patient wants to know if it is ok for her to take tylenol pm with her Ambien  every night.  Patient also is requesting a rx for meloxicam . States she did go to [facility] which has helped some.

## 2024-01-02 ENCOUNTER — Other Ambulatory Visit: Payer: Self-pay | Admitting: Family Medicine

## 2024-01-02 DIAGNOSIS — E1169 Type 2 diabetes mellitus with other specified complication: Secondary | ICD-10-CM

## 2024-01-12 ENCOUNTER — Encounter: Payer: Self-pay | Admitting: Family Medicine

## 2024-01-12 ENCOUNTER — Ambulatory Visit (INDEPENDENT_AMBULATORY_CARE_PROVIDER_SITE_OTHER): Admitting: Family Medicine

## 2024-01-12 VITALS — BP 136/88 | HR 67 | Temp 97.8°F | Ht 68.0 in | Wt 275.0 lb

## 2024-01-12 DIAGNOSIS — J4541 Moderate persistent asthma with (acute) exacerbation: Secondary | ICD-10-CM

## 2024-01-12 DIAGNOSIS — J0100 Acute maxillary sinusitis, unspecified: Secondary | ICD-10-CM

## 2024-01-12 MED ORDER — AZITHROMYCIN 250 MG PO TABS: ORAL_TABLET | ORAL | 0 refills | Status: DC

## 2024-01-12 NOTE — Patient Instructions (Signed)
 ZITHROMAX  SENT.  TAKE AIRSUPRA 2 PUFFS FOUR TIMES A DAY X 2 DAYS, THEN GO BACK TO 2 PUFFS FOUR TIMES A DAY AS NEEDED.  CONTINUE FLONASE  NASAL SPRAY.

## 2024-01-12 NOTE — Progress Notes (Signed)
 Acute Office Visit  Subjective:    Patient ID: Whitney Eaton, female    DOB: 1950-04-26, 73 y.o.   MRN: [id number]  Chief Complaint  Patient presents with   Sinusitis    History of Present Illness Whitney Eaton is a 73 year old female who presents for sinus infection. Symptoms x 6 days. Complaining of nasal congestion, cough, chest congestion and hoarseness. No fevers, chills, earaches.   '   Past Medical History:  Diagnosis Date   Asthma    Diabetes (HCC)    Dysmetabolic syndrome X    History of endometriosis    Hyperlipemia    Hypertension    Psoriasis    Sleep apnea    Thyroid  disease    Vitamin D  deficiency     Past Surgical History:  Procedure Laterality Date   ABDOMINAL HYSTERECTOMY     CHOLECYSTECTOMY  2011   COLONOSCOPY  05/27/2011   Minimal sigmoid diverticulosis. SMall internal hemorrhoids. Otherwise normal colonoscopy to terminal ileum   HYSTERECTOMY ABDOMINAL WITH SALPINGECTOMY     OOPHORECTOMY  1980   URETHRAL DILATION  03/2010   Dr Sallyanne    Family History  Problem Relation Age of Onset   Alzheimer's disease Mother    Breast cancer Mother    Arthritis Mother    Diabetes Mother    Eczema Father    CAD Father    Diabetes Father    Parkinson's disease Father    Breast cancer Sister    Neurologic Disorder Brother    Glaucoma Maternal Grandmother    Heart disease Other     Social History   Socioeconomic History   Marital status: Married    Spouse name: Not on file   Number of children: 2   Years of education: Not on file   Highest education level: Some college, no degree  Occupational History   Occupation: homemaker  Tobacco Use   Smoking status: Never   Smokeless tobacco: Never  Vaping Use   Vaping status: Never Used  Substance and Sexual Activity   Alcohol use: Never   Drug use: Never   Sexual activity: Not on file  Other Topics Concern   Not on file  Social History Narrative   Not on file   Social Drivers of Health    Financial Resource Strain: Low Risk  (01/11/2024)   Overall Financial Resource Strain (CARDIA)    Difficulty of Paying Living Expenses: Not hard at all  Food Insecurity: No Food Insecurity (01/11/2024)   Hunger Vital Sign    Worried About Running Out of Food in the Last Year: Never true    Ran Out of Food in the Last Year: Never true  Transportation Needs: No Transportation Needs (01/11/2024)   PRAPARE - Administrator, Civil Service (Medical): No    Lack of Transportation (Non-Medical): No  Physical Activity: Insufficiently Active (01/11/2024)   Exercise Vital Sign    Days of Exercise per Week: 3 days    Minutes of Exercise per Session: 20 min  Stress: No Stress Concern Present (01/11/2024)   [employer] of Occupational Health - Occupational Stress Questionnaire    Feeling of Stress: Only a little  Social Connections: Socially Integrated (01/11/2024)   Social Connection and Isolation Panel    Frequency of Communication with Friends and Family: More than three times a week    Frequency of Social Gatherings with Friends and Family: Three times a week    Attends Religious Services: More than  4 times per year    Active Member of Clubs or Organizations: Yes    Attends Banker Meetings: Not on file    Marital Status: Married  Intimate Partner Violence: Not At Risk (09/01/2023)   Humiliation, Afraid, Rape, and Kick questionnaire    Fear of Current or Ex-Partner: No    Emotionally Abused: No    Physically Abused: No    Sexually Abused: No    Outpatient Medications Prior to Visit  Medication Sig Dispense Refill   Albuterol -Budesonide (AIRSUPRA) 90-80 MCG/ACT AERO Inhale 2 puffs into the lungs every 4 (four) hours as needed. 10.7 g 1   BD PEN NEEDLE SHORT ULTRAFINE 31G X 8 MM MISC USE AS DIRECTED 100 each 2   bimekizumab-bkzx (BIMZELX) 160 MG/ML prefilled syringe Inject 160 mg into the skin every 30 (thirty) days.     blood glucose meter kit and supplies  KIT Check sugars fasting. E11.69 1 each 0   insulin  glargine (LANTUS  SOLOSTAR) 100 UNIT/ML Solostar Pen INJECT 35 UNITS INTO THE SKIN DAILY. 31.5 mL 0   Lancets (ONETOUCH DELICA PLUS LANCET33G) MISC USE TO CHECK BLOOD SUGAR MORNING, NOON AND BEDTIME 100 each 3   levothyroxine  (SYNTHROID ) 137 MCG tablet Take 1 tablet (137 mcg total) by mouth daily before breakfast. 90 tablet 0   meloxicam  (MOBIC ) 15 MG tablet Take 1 tablet (15 mg total) by mouth daily. 30 tablet 1   valsartan -hydrochlorothiazide  (DIOVAN -HCT) 320-25 MG tablet TAKE 1 TABLET BY MOUTH EVERY DAY 90 tablet 1   Vitamin D , Ergocalciferol , (DRISDOL ) 1.25 MG (50000 UNIT) CAPS capsule TAKE 1 CAPSULE (50,000 UNITS TOTAL) BY MOUTH EVERY 7 (SEVEN) DAYS 12 capsule 1   zolpidem  (AMBIEN ) 10 MG tablet Take 1 tablet (10 mg total) by mouth at bedtime as needed for sleep. 30 tablet 5   No facility-administered medications prior to visit.    Allergies  Allergen Reactions   Metformin  And Related Diarrhea   Crestor  [Rosuvastatin ]     myalgia   Fenofibrate     myalgia   Jardiance [Empagliflozin]     Nausea.   Lipitor [Atorvastatin]     myalgia    Review of Systems  Constitutional:  Negative for appetite change, fatigue and fever.  HENT:  Positive for congestion, sinus pressure and sinus pain. Negative for ear pain and sore throat.   Respiratory:  Positive for cough, chest tightness and wheezing. Negative for shortness of breath.   Cardiovascular:  Negative for chest pain and palpitations.  Gastrointestinal:  Negative for abdominal pain, constipation, diarrhea, nausea and vomiting.  Genitourinary:  Negative for dysuria and hematuria.  Musculoskeletal:  Negative for arthralgias, back pain, joint swelling and myalgias.  Skin:  Negative for rash.  Neurological:  Negative for dizziness, weakness and headaches.  Psychiatric/Behavioral:  Negative for dysphoric mood. The patient is not nervous/anxious.        Objective:        01/12/2024     3:17 PM 12/17/2023   10:12 AM 09/01/2023   10:19 AM  Vitals with BMI  Height 5' 8 5' 8 5' 8  Weight 275 lbs 278 lbs 281 lbs  BMI 41.82 42.28 42.74  Systolic 136 136 889  Diastolic 88 78 60  Pulse 67 62 81    No data found.    Physical Exam Vitals reviewed.  Constitutional:      Appearance: Normal appearance. She is normal weight.  HENT:     Right Ear: Tympanic membrane, ear canal and external  ear normal.     Left Ear: Tympanic membrane and external ear normal.     Nose: Congestion present.     Comments: SINUS TENDERNESS BL.     Mouth/Throat:     Pharynx: Oropharynx is clear.  Cardiovascular:     Rate and Rhythm: Normal rate and regular rhythm.     Pulses: Normal pulses.     Heart sounds: Normal heart sounds. No murmur heard. Pulmonary:     Effort: Pulmonary effort is normal. No respiratory distress.     Breath sounds: Wheezing (DIFFUSE) present.  Neurological:     Mental Status: She is alert and oriented to person, place, and time.  Psychiatric:        Mood and Affect: Mood normal.        Behavior: Behavior normal.     Health Maintenance Due  Topic Date Due   Colonoscopy  06/24/2021   Medicare Annual Wellness (AWV)  02/08/2024    There are no preventive care reminders to display for this patient.   Lab Results  Component Value Date   TSH 4.390 12/17/2023   Lab Results  Component Value Date   WBC 9.1 09/01/2023   HGB 14.0 09/01/2023   HCT 43.0 09/01/2023   MCV 94 09/01/2023   PLT 225 09/01/2023   Lab Results  Component Value Date   NA 131 (L) 12/17/2023   K 3.7 12/17/2023   CO2 27 12/17/2023   GLUCOSE 77 12/17/2023   BUN 17 12/17/2023   CREATININE 0.73 12/17/2023   BILITOT 0.6 12/17/2023   ALKPHOS 88 12/17/2023   AST 23 12/17/2023   ALT 14 12/17/2023   PROT 7.2 12/17/2023   ALBUMIN 4.3 12/17/2023   CALCIUM  9.7 12/17/2023   EGFR 87 12/17/2023   Lab Results  Component Value Date   CHOL 189 09/01/2023   Lab Results  Component Value  Date   HDL 38 (L) 09/01/2023   Lab Results  Component Value Date   LDLCALC 119 (H) 09/01/2023   Lab Results  Component Value Date   TRIG 182 (H) 09/01/2023   Lab Results  Component Value Date   CHOLHDL 5.0 (H) 09/01/2023   Lab Results  Component Value Date   HGBA1C 6.0 12/17/2023        Results for orders placed or performed in visit on 12/17/23  POCT glycosylated hemoglobin (Hb A1C)   Collection Time: 12/17/23 10:31 AM  Result Value Ref Range   Hemoglobin A1C     HbA1c POC (<> result, manual entry) 6.0 4.0 - 5.6 %   HbA1c, POC (prediabetic range)     HbA1c, POC (controlled diabetic range)    POCT Lipid Panel   Collection Time: 12/17/23 10:33 AM  Result Value Ref Range   TC 170    HDL 56    TRG 85    LDL 97    Non-HDL 114    TC/HDL    Comprehensive metabolic panel with GFR   Collection Time: 12/17/23 11:25 AM  Result Value Ref Range   Glucose 77 70 - 99 mg/dL   BUN 17 8 - 27 mg/dL   Creatinine, Ser 9.26 0.57 - 1.00 mg/dL   eGFR 87 >40 fO/fpw/8.26   BUN/Creatinine Ratio 23 12 - 28   Sodium 131 (L) 134 - 144 mmol/L   Potassium 3.7 3.5 - 5.2 mmol/L   Chloride 91 (L) 96 - 106 mmol/L   CO2 27 20 - 29 mmol/L   Calcium  9.7 8.7 - 10.3  mg/dL   Total Protein 7.2 6.0 - 8.5 g/dL   Albumin 4.3 3.8 - 4.8 g/dL   Globulin, Total 2.9 1.5 - 4.5 g/dL   Bilirubin Total 0.6 0.0 - 1.2 mg/dL   Alkaline Phosphatase 88 49 - 135 IU/L   AST 23 0 - 40 IU/L   ALT 14 0 - 32 IU/L  T4, free   Collection Time: 12/17/23 11:25 AM  Result Value Ref Range   Free T4 1.65 0.82 - 1.77 ng/dL  TSH   Collection Time: 12/17/23 11:25 AM  Result Value Ref Range   TSH 4.390 0.450 - 4.500 uIU/mL  VITAMIN D  25 Hydroxy (Vit-D Deficiency, Fractures)   Collection Time: 12/17/23 11:25 AM  Result Value Ref Range   Vit D, 25-Hydroxy 44.7 30.0 - 100.0 ng/mL     Assessment & Plan:   Assessment & Plan Acute non-recurrent maxillary sinusitis Continue flonase  Zpack prescribed. Recommend give 24-48  hours prior to starting to see if airsupra helps.     Moderate persistent asthma with exacerbation Use airsupra 2 puffs four times a day x 48 hours and then 2 puffs four times a day as needed.        Body mass index is 41.81 kg/m..   Meds ordered this encounter  Medications   azithromycin  (ZITHROMAX ) 250 MG tablet    Sig: 2 DAILY FOR FIRST DAY, THEN DECREASE TO ONE DAILY FOR 4 MORE DAYS.    Dispense:  6 tablet    Refill:  0    No orders of the defined types were placed in this encounter.    Follow-up: Return if symptoms worsen or fail to improve.  An After Visit Summary was printed and given to the patient.   Abigail Free, MD [facility] [phone]

## 2024-01-15 NOTE — Assessment & Plan Note (Signed)
 Continue flonase  Zpack prescribed. Recommend give 24-48 hours prior to starting to see if airsupra helps.

## 2024-01-15 NOTE — Assessment & Plan Note (Signed)
 Use airsupra 2 puffs four times a day x 48 hours and then 2 puffs four times a day as needed.

## 2024-01-19 ENCOUNTER — Other Ambulatory Visit: Payer: Self-pay | Admitting: Family Medicine

## 2024-01-25 DIAGNOSIS — M25562 Pain in left knee: Secondary | ICD-10-CM | POA: Diagnosis not present

## 2024-01-25 DIAGNOSIS — M6281 Muscle weakness (generalized): Secondary | ICD-10-CM | POA: Diagnosis not present

## 2024-01-29 ENCOUNTER — Other Ambulatory Visit: Payer: Self-pay | Admitting: Family Medicine

## 2024-01-29 DIAGNOSIS — E1165 Type 2 diabetes mellitus with hyperglycemia: Secondary | ICD-10-CM

## 2024-01-31 ENCOUNTER — Other Ambulatory Visit: Payer: Self-pay | Admitting: Family Medicine

## 2024-01-31 MED ORDER — FLUCONAZOLE 150 MG PO TABS: 150.0000 mg | ORAL_TABLET | Freq: Every day | ORAL | 0 refills | Status: DC

## 2024-02-09 ENCOUNTER — Ambulatory Visit (INDEPENDENT_AMBULATORY_CARE_PROVIDER_SITE_OTHER)

## 2024-02-09 ENCOUNTER — Ambulatory Visit

## 2024-02-09 ENCOUNTER — Ambulatory Visit: Admitting: Family Medicine

## 2024-02-09 VITALS — BP 128/82 | HR 88 | Temp 98.2°F | Ht 68.0 in | Wt 270.0 lb

## 2024-02-09 DIAGNOSIS — Z794 Long term (current) use of insulin: Secondary | ICD-10-CM | POA: Diagnosis not present

## 2024-02-09 DIAGNOSIS — Z0000 Encounter for general adult medical examination without abnormal findings: Secondary | ICD-10-CM | POA: Diagnosis not present

## 2024-02-09 DIAGNOSIS — N182 Chronic kidney disease, stage 2 (mild): Secondary | ICD-10-CM

## 2024-02-09 DIAGNOSIS — M6281 Muscle weakness (generalized): Secondary | ICD-10-CM | POA: Diagnosis not present

## 2024-02-09 DIAGNOSIS — M25562 Pain in left knee: Secondary | ICD-10-CM | POA: Diagnosis not present

## 2024-02-09 DIAGNOSIS — Z6841 Body Mass Index (BMI) 40.0 and over, adult: Secondary | ICD-10-CM

## 2024-02-09 DIAGNOSIS — E1122 Type 2 diabetes mellitus with diabetic chronic kidney disease: Secondary | ICD-10-CM

## 2024-02-09 DIAGNOSIS — M25552 Pain in left hip: Secondary | ICD-10-CM | POA: Diagnosis not present

## 2024-02-09 DIAGNOSIS — E66813 Obesity, class 3: Secondary | ICD-10-CM | POA: Diagnosis not present

## 2024-02-09 NOTE — Progress Notes (Signed)
 Chief Complaint  Patient presents with   Annual Exam     Subjective:   Whitney Eaton is a 73 y.o. female who presents for a Medicare Annual Wellness Visit.  Visit info / Clinical Intake: Medicare Wellness Visit Type:: Subsequent Annual Wellness Visit Persons participating in visit and providing information:: patient Medicare Wellness Visit Mode:: In-person (required for WTM) Interpreter Needed?: No Pre-visit prep was completed: no AWV questionnaire completed by patient prior to visit?: no Living arrangements:: lives with spouse/significant other Patient's Overall Health Status Rating: very good Typical amount of pain: none Does pain affect daily life?: no Are you currently prescribed opioids?: (!) yes (multiple)  Dietary Habits and Nutritional Risks How many meals a day?: 3 Eats fruit and vegetables daily?: yes Most meals are obtained by: preparing own meals; eating out In the last 2 weeks, have you had any of the following?: none Diabetic:: (!) yes Any non-healing wounds?: no How often do you check your BS?: continuous glucose monitor Would you like to be referred to a Nutritionist or for Diabetic Management? : no  Functional Status Activities of Daily Living (to include ambulation/medication): Independent Ambulation: Independent with device- listed below Home Assistive Devices/Equipment: Cane Medication Administration: Independent Home Management (perform basic housework or laundry): Independent Manage your own finances?: yes Primary transportation is: driving Concerns about vision?: no *vision screening is required for WTM* Concerns about hearing?: no  Fall Screening Falls in the past year?: 0 Number of falls in past year: 0 Was there an injury with Fall?: 0 Fall Risk Category Calculator: 0 Patient Fall Risk Level: Low Fall Risk  Fall Risk Patient at Risk for Falls Due to: No Fall Risks Fall risk Follow up: Falls evaluation completed  Home and Transportation  Safety: All rugs have non-skid backing?: yes All stairs or steps have railings?: yes Grab bars in the bathtub or shower?: yes Have non-skid surface in bathtub or shower?: yes Good home lighting?: yes Regular seat belt use?: yes Hospital stays in the last year:: no  Cognitive Assessment Difficulty concentrating, remembering, or making decisions? : no Will 6CIT or Mini Cog be Completed: yes What year is it?: 0 points What month is it?: 0 points Give patient an address phrase to remember (5 components): [address] [place] About what time is it?: 0 points Count backwards from 20 to 1: 0 points Say the months of the year in reverse: 0 points Repeat the address phrase from earlier: 0 points 6 CIT Score: 0 points  Advance Directives (For Healthcare) Does Patient Have a Medical Advance Directive?: Yes Type of Advance Directive: Healthcare Power of [place]; Living will; Out of facility DNR (pink MOST or yellow form) Copy of Healthcare Power of Attorney in Chart?: No - copy requested Copy of Living Will in Chart?: No - copy requested Out of facility DNR (pink MOST or yellow form) in Chart? (Ambulatory ONLY): No - copy requested  Reviewed/Updated  Reviewed/Updated: Reviewed All (Medical, Surgical, Family, Medications, Allergies, Care Teams, Patient Goals)    Allergies (verified) Metformin  and related, Crestor  [rosuvastatin ], Fenofibrate, Jardiance [empagliflozin], and Lipitor [atorvastatin]   Current Medications (verified) Outpatient Encounter Medications as of 02/09/2024  Medication Sig   Albuterol -Budesonide (AIRSUPRA ) 90-80 MCG/ACT AERO Inhale 2 puffs into the lungs every 4 (four) hours as needed.   bimekizumab-bkzx (BIMZELX) 160 MG/ML prefilled syringe Inject 160 mg into the skin every 30 (thirty) days.   blood glucose meter kit and supplies KIT Check sugars fasting. E11.69   EMBECTA PEN NEEDLE  ULTRAFINE 31G X 8 MM MISC USE AS DIRECTED   insulin  glargine (LANTUS  SOLOSTAR)  100 UNIT/ML Solostar Pen INJECT 35 UNITS INTO THE SKIN DAILY.   Lancets (ONETOUCH DELICA PLUS LANCET33G) MISC USE TO CHECK BLOOD SUGAR MORNING, NOON AND BEDTIME   levothyroxine  (SYNTHROID ) 137 MCG tablet Take 1 tablet (137 mcg total) by mouth daily before breakfast.   meloxicam  (MOBIC ) 15 MG tablet Take 1 tablet (15 mg total) by mouth daily.   valsartan -hydrochlorothiazide  (DIOVAN -HCT) 320-25 MG tablet TAKE 1 TABLET BY MOUTH EVERY DAY   Vitamin D , Ergocalciferol , (DRISDOL ) 1.25 MG (50000 UNIT) CAPS capsule TAKE 1 CAPSULE (50,000 UNITS TOTAL) BY MOUTH EVERY 7 (SEVEN) DAYS   zolpidem  (AMBIEN ) 10 MG tablet Take 1 tablet (10 mg total) by mouth at bedtime as needed for sleep.   No facility-administered encounter medications on file as of 02/09/2024.    History: Past Medical History:  Diagnosis Date   Arthritis    Asthma    Cataract    Diabetes (HCC)    Dysmetabolic syndrome X    History of endometriosis    Hyperlipemia    Hypertension    Psoriasis    Sleep apnea    Thyroid  disease    Vitamin D  deficiency    Past Surgical History:  Procedure Laterality Date   ABDOMINAL HYSTERECTOMY     BREAST SURGERY  1980   Benign tumor   CHOLECYSTECTOMY  2011   COLONOSCOPY  05/27/2011   Minimal sigmoid diverticulosis. SMall internal hemorrhoids. Otherwise normal colonoscopy to terminal ileum   HYSTERECTOMY ABDOMINAL WITH SALPINGECTOMY     OOPHORECTOMY  1980   URETHRAL DILATION  03/2010   Dr Sallyanne   Family History  Problem Relation Age of Onset   Alzheimer's disease Mother    Breast cancer Mother    Arthritis Mother    Diabetes Mother    Cancer Mother    Obesity Mother    Eczema Father    CAD Father    Diabetes Father    Parkinson's disease Father    Breast cancer Sister    Neurologic Disorder Brother    Glaucoma Maternal Grandmother    Cancer Maternal Grandmother    Obesity Maternal Grandmother    Heart disease Other    Cancer Sister    Varicose Veins Daughter    Social  History   Occupational History   Occupation: homemaker  Tobacco Use   Smoking status: Never   Smokeless tobacco: Never  Vaping Use   Vaping status: Never Used  Substance and Sexual Activity   Alcohol use: Never   Drug use: Never   Sexual activity: Not on file   Tobacco Counseling Counseling given: Not Answered  SDOH Screenings   Food Insecurity: No Food Insecurity (02/09/2024)  Housing: Unknown (02/09/2024)  Transportation Needs: No Transportation Needs (02/09/2024)  Utilities: Not At Risk (02/09/2024)  Alcohol Screen: Low Risk  (09/01/2023)  Depression (PHQ2-9): Low Risk  (02/09/2024)  Financial Resource Strain: Low Risk  (01/11/2024)  Physical Activity: Insufficiently Active (02/09/2024)  Social Connections: Socially Integrated (02/09/2024)  Stress: No Stress Concern Present (02/09/2024)  Tobacco Use: Low Risk  (01/12/2024)  Health Literacy: Adequate Health Literacy (02/09/2024)   See flowsheets for full screening details  Depression Screen PHQ 2 & 9 Depression Scale- Over the past 2 weeks, how often have you been bothered by any of the following problems? Little interest or pleasure in doing things: 0 Feeling down, depressed, or hopeless (PHQ Adolescent also includes...irritable): 0 PHQ-2 Total Score:  0 Trouble falling or staying asleep, or sleeping too much: 0 Feeling tired or having little energy: 0 Poor appetite or overeating (PHQ Adolescent also includes...weight loss): 0 Feeling bad about yourself - or that you are a failure or have let yourself or your family down: 0 Trouble concentrating on things, such as reading the newspaper or watching television (PHQ Adolescent also includes...like school work): 0 Moving or speaking so slowly that other people could have noticed. Or the opposite - being so fidgety or restless that you have been moving around a lot more than usual: 0 Thoughts that you would be better off dead, or of hurting yourself in some way: 0 PHQ-9 Total  Score: 0     Goals Addressed               This Visit's Progress     Patient Stated (pt-stated)        Patient stated she would like to gain her muscle back in her knee/legs.              Objective:    Today's Vitals   02/09/24 1555  BP: 128/82  Pulse: 88  Temp: 98.2 F (36.8 C)  SpO2: 92%  Weight: 270 lb (122.5 kg)  Height: 5' 8 (1.727 m)   Body mass index is 41.05 kg/m.  Hearing/Vision screen No results found. Immunizations and Health Maintenance Health Maintenance  Topic Date Due   Colonoscopy  06/24/2021   DTaP/Tdap/Td (1 - Tdap) 05/25/2024 (Originally 03/07/1969)   OPHTHALMOLOGY EXAM  03/14/2024   Diabetic kidney evaluation - Urine ACR  05/25/2024   HEMOGLOBIN A1C  06/16/2024   Diabetic kidney evaluation - eGFR measurement  12/16/2024   FOOT EXAM  12/16/2024   Mammogram  12/16/2024   Medicare Annual Wellness (AWV)  02/08/2025   Pneumococcal Vaccine: 50+ Years  Completed   Influenza Vaccine  Completed   Bone Density Scan  Completed   Hepatitis C Screening  Completed   Zoster Vaccines- Shingrix  Completed   Meningococcal B Vaccine  Aged Out   COVID-19 Vaccine  Discontinued        Assessment/Plan:  This is a routine wellness examination for Whitney Eaton.  Patient Care Team: Sherre Clapper, MD as PCP - General (Family Medicine) Denamur, Darin, DC as Referring Physician (Chiropractic Medicine) Tonnie Race, PA-C (Dermatology) Maryjo Levander LABOR, OD (Optometry) Ival Domino, FNP as Nurse Practitioner (Urology) Maryjo, Ray A, OD (Optometry)  I have personally reviewed and noted the following in the patients chart:   Medical and social history Use of alcohol, tobacco or illicit drugs  Current medications and supplements including opioid prescriptions. Functional ability and status Nutritional status Physical activity Advanced directives List of other physicians Hospitalizations, surgeries, and ER visits in previous 12 months Vitals Screenings to  include cognitive, depression, and falls Referrals and appointments  No orders of the defined types were placed in this encounter.  In addition, I have reviewed and discussed with patient certain preventive protocols, quality metrics, and best practice recommendations. A written personalized care plan for preventive services as well as general preventive health recommendations were provided to patient.   [place] [employer], [place]   02/09/2024   Return in 1 year (on 02/08/2025).  After Visit Summary: (In Person-Declined) Patient declined AVS at this time.  Nurse Notes: I spent an hour with patient. She has no questions or concerns at time.

## 2024-02-09 NOTE — Patient Instructions (Signed)
 Ms. Whitney Eaton , Thank you for taking time to come for your Medicare Wellness Visit. I appreciate your ongoing commitment to your health goals. Please review the following plan we discussed and let me know if I can assist you in the future.   These are the goals we discussed:  Goals       Manage My Medicine      Timeframe:  Long-Range Goal Priority:  High Start Date:                             Expected End Date:                       Follow Up Date 03/2021   - call for medicine refill 2 or 3 days before it runs out - keep a list of all the medicines I take; vitamins and herbals too - use a pillbox to sort medicine    Why is this important?   These steps will help you keep on track with your medicines.   Notes:       Monitor and Manage My Blood Sugar-Diabetes Type 2      Timeframe:  Long-Range Goal Priority:  High Start Date:                             Expected End Date:                       Follow Up Date 03/2021    - check blood sugar at prescribed times    Why is this important?   Checking your blood sugar at home helps to keep it from getting very high or very low.  Writing the results in a diary or log helps the doctor know how to care for you.  Your blood sugar log should have the time, date and the results.  Also, write down the amount of insulin  or other medicine that you take.  Other information, like what you ate, exercise done and how you were feeling, will also be helpful.     Notes:       Patient Stated      Become more mobile      Patient Stated (pt-stated)      Patient stated she would like to gain her muscle back in her knee/legs.       Set My Target A1C-Diabetes Type 2      Timeframe:  Long-Range Goal Priority:  High Start Date:                             Expected End Date:                       Follow Up Date 03/2021    - set target A1C    Why is this important?   Your target A1C is decided together by you and your doctor.  It is based on  several things like your age and other health issues.    Notes:         This is a list of the screening recommended for you and due dates:  Health Maintenance  Topic Date Due   Colon Cancer Screening  06/24/2021   DTaP/Tdap/Td vaccine (1 - Tdap) 05/25/2024*   Eye exam for  diabetics  03/14/2024   Yearly kidney health urinalysis for diabetes  05/25/2024   Hemoglobin A1C  06/16/2024   Yearly kidney function blood test for diabetes  12/16/2024   Complete foot exam   12/16/2024   Breast Cancer Screening  12/16/2024   Medicare Annual Wellness Visit  02/08/2025   Pneumococcal Vaccine for age over 33  Completed   Flu Shot  Completed   Osteoporosis screening with Bone Density Scan  Completed   Hepatitis C Screening  Completed   Zoster (Shingles) Vaccine  Completed   Meningitis B Vaccine  Aged Out   COVID-19 Vaccine  Discontinued  *Topic was postponed. The date shown is not the original due date.    Advanced directives: yes    Next appointment: Follow up in one year for your annual wellness visit    Preventive Care 65 Years and Older, Female Preventive care refers to lifestyle choices and visits with your health care provider that can promote health and wellness. What does preventive care include? A yearly physical exam. This is also called an annual well check. Dental exams once or twice a year. Routine eye exams. Ask your health care provider how often you should have your eyes checked. Personal lifestyle choices, including: Daily care of your teeth and gums. Regular physical activity. Eating a healthy diet. Avoiding tobacco and drug use. Limiting alcohol use. Practicing safe sex. Taking low-dose aspirin every day. Taking vitamin and mineral supplements as recommended by your health care provider. What happens during an annual well check? The services and screenings done by your health care provider during your annual well check will depend on your age, overall health,  lifestyle risk factors, and family history of disease. Counseling  Your health care provider may ask you questions about your: Alcohol use. Tobacco use. Drug use. Emotional well-being. Home and relationship well-being. Sexual activity. Eating habits. History of falls. Memory and ability to understand (cognition). Work and work astronomer. Reproductive health. Screening  You may have the following tests or measurements: Height, weight, and BMI. Blood pressure. Lipid and cholesterol levels. These may be checked every 5 years, or more frequently if you are over 13 years old. Skin check. Lung cancer screening. You may have this screening every year starting at age 51 if you have a 30-pack-year history of smoking and currently smoke or have quit within the past 15 years. Fecal occult blood test (FOBT) of the stool. You may have this test every year starting at age 80. Flexible sigmoidoscopy or colonoscopy. You may have a sigmoidoscopy every 5 years or a colonoscopy every 10 years starting at age 40. Hepatitis C blood test. Hepatitis B blood test. Sexually transmitted disease (STD) testing. Diabetes screening. This is done by checking your blood sugar (glucose) after you have not eaten for a while (fasting). You may have this done every 1-3 years. Bone density scan. This is done to screen for osteoporosis. You may have this done starting at age 10. Mammogram. This may be done every 1-2 years. Talk to your health care provider about how often you should have regular mammograms. Talk with your health care provider about your test results, treatment options, and if necessary, the need for more tests. Vaccines  Your health care provider may recommend certain vaccines, such as: Influenza vaccine. This is recommended every year. Tetanus, diphtheria, and acellular pertussis (Tdap, Td) vaccine. You may need a Td booster every 10 years. Zoster vaccine. You may need this after age  51. Pneumococcal 13-valent  conjugate (PCV13) vaccine. One dose is recommended after age 2. Pneumococcal polysaccharide (PPSV23) vaccine. One dose is recommended after age 23. Talk to your health care provider about which screenings and vaccines you need and how often you need them. This information is not intended to replace advice given to you by your health care provider. Make sure you discuss any questions you have with your health care provider. Document Released: 03/15/2015 Document Revised: 11/06/2015 Document Reviewed: 12/18/2014 Elsevier Interactive Patient Education  2017 [employer].  Fall Prevention in the Home Falls can cause injuries. They can happen to people of all ages. There are many things you can do to make your home safe and to help prevent falls. What can I do on the outside of my home? Regularly fix the edges of walkways and driveways and fix any cracks. Remove anything that might make you trip as you walk through a door, such as a raised step or threshold. Trim any bushes or trees on the path to your home. Use bright outdoor lighting. Clear any walking paths of anything that might make someone trip, such as rocks or tools. Regularly check to see if handrails are loose or broken. Make sure that both sides of any steps have handrails. Any raised decks and porches should have guardrails on the edges. Have any leaves, snow, or ice cleared regularly. Use sand or salt on walking paths during winter. Clean up any spills in your garage right away. This includes oil or grease spills. What can I do in the bathroom? Use night lights. Install grab bars by the toilet and in the tub and shower. Do not use towel bars as grab bars. Use non-skid mats or decals in the tub or shower. If you need to sit down in the shower, use a plastic, non-slip stool. Keep the floor dry. Clean up any water that spills on the floor as soon as it happens. Remove soap buildup in the tub or shower  regularly. Attach bath mats securely with double-sided non-slip rug tape. Do not have throw rugs and other things on the floor that can make you trip. What can I do in the bedroom? Use night lights. Make sure that you have a light by your bed that is easy to reach. Do not use any sheets or blankets that are too big for your bed. They should not hang down onto the floor. Have a firm chair that has side arms. You can use this for support while you get dressed. Do not have throw rugs and other things on the floor that can make you trip. What can I do in the kitchen? Clean up any spills right away. Avoid walking on wet floors. Keep items that you use a lot in easy-to-reach places. If you need to reach something above you, use a strong step stool that has a grab bar. Keep electrical cords out of the way. Do not use floor polish or wax that makes floors slippery. If you must use wax, use non-skid floor wax. Do not have throw rugs and other things on the floor that can make you trip. What can I do with my stairs? Do not leave any items on the stairs. Make sure that there are handrails on both sides of the stairs and use them. Fix handrails that are broken or loose. Make sure that handrails are as long as the stairways. Check any carpeting to make sure that it is firmly attached to the stairs. Fix any carpet that is  loose or worn. Avoid having throw rugs at the top or bottom of the stairs. If you do have throw rugs, attach them to the floor with carpet tape. Make sure that you have a light switch at the top of the stairs and the bottom of the stairs. If you do not have them, ask someone to add them for you. What else can I do to help prevent falls? Wear shoes that: Do not have high heels. Have rubber bottoms. Are comfortable and fit you well. Are closed at the toe. Do not wear sandals. If you use a stepladder: Make sure that it is fully opened. Do not climb a closed stepladder. Make sure that  both sides of the stepladder are locked into place. Ask someone to hold it for you, if possible. Clearly mark and make sure that you can see: Any grab bars or handrails. First and last steps. Where the edge of each step is. Use tools that help you move around (mobility aids) if they are needed. These include: Canes. Walkers. Scooters. Crutches. Turn on the lights when you go into a dark area. Replace any light bulbs as soon as they burn out. Set up your furniture so you have a clear path. Avoid moving your furniture around. If any of your floors are uneven, fix them. If there are any pets around you, be aware of where they are. Review your medicines with your doctor. Some medicines can make you feel dizzy. This can increase your chance of falling. Ask your doctor what other things that you can do to help prevent falls. This information is not intended to replace advice given to you by your health care provider. Make sure you discuss any questions you have with your health care provider. Document Released: 12/13/2008 Document Revised: 07/25/2015 Document Reviewed: 03/23/2014 Elsevier Interactive Patient Education  2017 [employer].

## 2024-02-09 NOTE — Progress Notes (Unsigned)
 Subjective:  Patient ID: Montel Rakers, female    DOB: 08/26/1950  Age: 73 y.o. MRN: [id number]  Chief Complaint  Patient presents with   DMII    Issues with freestyle, started using her regular glucometer    HPI: Discussed the use of AI scribe software for clinical note transcription with the patient, who gave verbal consent to proceed.  History of Present Illness Charlen Sutherland is a 73 year old female with diabetes who presents with issues related to her glucose monitoring system.  Glucose monitoring system issues - Dissatisfaction with current Freestyle Libre system due to frequent inaccurate readings. - Device often displays low glucose readings, which are sometimes true and sometimes false. - Believes inaccuracies are related to device measuring subcutaneous fluid rather than blood. - Device adherence problems resulting in bleeding at the application site. - Difficulty contacting supplier for device replacements.  Glycemic fluctuations and hypoglycemia - Recent blood glucose readings range from 113 to 165 mg/dL. - Experienced hypoglycemic episode with blood sugar dropping into the 50s. - Symptoms during hypoglycemia included nausea and confusion. - During one episode, experienced pain and concern for possible myocardial infarction, which resolved as blood sugar normalized.  Dietary management and insulin  adjustment - Increased morning protein intake and consumes snacks such as peanut butter crackers or sugar-free candy before bed to manage blood glucose. - Recently reduced insulin  dosage by five units. - Dietary indulgences occurred during Thanksgiving, but efforts to maintain a healthy diet continue. - Noted weight change, with some discrepancy in recorded weight.  Constitutional and other symptoms - No current symptoms of illness, fevers, chills, sweats, hearing issues, sore throat, nasal congestion, or chest pain.       02/09/2024    4:22 PM 12/25/2023    8:05 PM  09/01/2023   10:22 AM 05/26/2023   10:15 AM 02/08/2023    2:43 PM  Depression screen PHQ 2/9  Decreased Interest 0 0 0 0 0  Down, Depressed, Hopeless 0 0 0 0 0  PHQ - 2 Score 0 0 0 0 0  Altered sleeping 0 0   0  Tired, decreased energy 0 0   0  Change in appetite 0 0   0  Feeling bad or failure about yourself  0 0   0  Trouble concentrating 0 0   0  Moving slowly or fidgety/restless 0 0   0  Suicidal thoughts 0 0   0  PHQ-9 Score 0 0    0   Difficult doing work/chores     Not difficult at all     Data saved with a previous flowsheet row definition        02/09/2024    4:03 PM  Fall Risk   Falls in the past year? 0  Number falls in past yr: 0  Injury with Fall? 0  Risk for fall due to : No Fall Risks  Follow up Falls evaluation completed    Patient Care Team: Sherre Clapper, MD as PCP - General (Family Medicine) Denamur, Darin, DC as Referring Physician (Chiropractic Medicine) Tonnie Race, PA-C (Dermatology) Maryjo Levander LABOR, OD (Optometry) Ival Domino, FNP as Nurse Practitioner (Urology) Maryjo, Ray A, OD (Optometry)   Review of Systems  Current Outpatient Medications on File Prior to Visit  Medication Sig Dispense Refill   Albuterol -Budesonide (AIRSUPRA ) 90-80 MCG/ACT AERO Inhale 2 puffs into the lungs every 4 (four) hours as needed. 10.7 g 1   bimekizumab-bkzx (BIMZELX) 160 MG/ML prefilled syringe Inject 160  mg into the skin every 30 (thirty) days.     blood glucose meter kit and supplies KIT Check sugars fasting. E11.69 1 each 0   EMBECTA PEN NEEDLE ULTRAFINE 31G X 8 MM MISC USE AS DIRECTED 100 each 3   insulin  glargine (LANTUS  SOLOSTAR) 100 UNIT/ML Solostar Pen INJECT 35 UNITS INTO THE SKIN DAILY. 31.5 mL 0   Lancets (ONETOUCH DELICA PLUS LANCET33G) MISC USE TO CHECK BLOOD SUGAR MORNING, NOON AND BEDTIME 100 each 3   levothyroxine  (SYNTHROID ) 137 MCG tablet Take 1 tablet (137 mcg total) by mouth daily before breakfast. 90 tablet 0   meloxicam  (MOBIC ) 15 MG tablet Take  1 tablet (15 mg total) by mouth daily. 30 tablet 1   valsartan -hydrochlorothiazide  (DIOVAN -HCT) 320-25 MG tablet TAKE 1 TABLET BY MOUTH EVERY DAY 90 tablet 1   Vitamin D , Ergocalciferol , (DRISDOL ) 1.25 MG (50000 UNIT) CAPS capsule TAKE 1 CAPSULE (50,000 UNITS TOTAL) BY MOUTH EVERY 7 (SEVEN) DAYS 12 capsule 1   zolpidem  (AMBIEN ) 10 MG tablet Take 1 tablet (10 mg total) by mouth at bedtime as needed for sleep. 30 tablet 5   No current facility-administered medications on file prior to visit.   Past Medical History:  Diagnosis Date   Arthritis    Asthma    Cataract    Diabetes (HCC)    Dysmetabolic syndrome X    History of endometriosis    Hyperlipemia    Hypertension    Psoriasis    Sleep apnea    Thyroid  disease    Vitamin D  deficiency    Past Surgical History:  Procedure Laterality Date   ABDOMINAL HYSTERECTOMY     BREAST SURGERY  1980   Benign tumor   CHOLECYSTECTOMY  2011   COLONOSCOPY  05/27/2011   Minimal sigmoid diverticulosis. SMall internal hemorrhoids. Otherwise normal colonoscopy to terminal ileum   HYSTERECTOMY ABDOMINAL WITH SALPINGECTOMY     OOPHORECTOMY  1980   URETHRAL DILATION  03/2010   Dr Sallyanne    Family History  Problem Relation Age of Onset   Alzheimer's disease Mother    Breast cancer Mother    Arthritis Mother    Diabetes Mother    Cancer Mother    Obesity Mother    Eczema Father    CAD Father    Diabetes Father    Parkinson's disease Father    Breast cancer Sister    Neurologic Disorder Brother    Glaucoma Maternal Grandmother    Cancer Maternal Grandmother    Obesity Maternal Grandmother    Heart disease Other    Cancer Sister    Varicose Veins Daughter    Social History   Socioeconomic History   Marital status: Married    Spouse name: Not on file   Number of children: 2   Years of education: Not on file   Highest education level: Some college, no degree  Occupational History   Occupation: homemaker  Tobacco Use   Smoking  status: Never   Smokeless tobacco: Never  Vaping Use   Vaping status: Never Used  Substance and Sexual Activity   Alcohol use: Never   Drug use: Never   Sexual activity: Not on file  Other Topics Concern   Not on file  Social History Narrative   Not on file   Social Drivers of Health   Tobacco Use: Low Risk (01/12/2024)   Patient History    Smoking Tobacco Use: Never    Smokeless Tobacco Use: Never    Passive  Exposure: Not on file  Financial Resource Strain: Low Risk (01/11/2024)   Overall Financial Resource Strain (CARDIA)    Difficulty of Paying Living Expenses: Not hard at all  Food Insecurity: No Food Insecurity (02/09/2024)   Epic    Worried About Programme Researcher, Broadcasting/film/video in the Last Year: Never true    Ran Out of Food in the Last Year: Never true  Transportation Needs: No Transportation Needs (02/09/2024)   Epic    Lack of Transportation (Medical): No    Lack of Transportation (Non-Medical): No  Physical Activity: Insufficiently Active (02/09/2024)   Exercise Vital Sign    Days of Exercise per Week: 7 days    Minutes of Exercise per Session: 20 min  Stress: No Stress Concern Present (02/09/2024)   [employer] of Occupational Health - Occupational Stress Questionnaire    Feeling of Stress: Only a little  Social Connections: Socially Integrated (02/09/2024)   Social Connection and Isolation Panel    Frequency of Communication with Friends and Family: More than three times a week    Frequency of Social Gatherings with Friends and Family: More than three times a week    Attends Religious Services: More than 4 times per year    Active Member of Clubs or Organizations: Yes    Attends Banker Meetings: Never    Marital Status: Married  Depression (PHQ2-9): Low Risk (02/09/2024)   Depression (PHQ2-9)    PHQ-2 Score: 0  Alcohol Screen: Low Risk (09/01/2023)   Alcohol Screen    Last Alcohol Screening Score (AUDIT): 0  Housing: Unknown (02/09/2024)   Epic     Unable to Pay for Housing in the Last Year: No    Number of Times Moved in the Last Year: Not on file    Homeless in the Last Year: No  Utilities: Not At Risk (02/09/2024)   Epic    Threatened with loss of utilities: No  Health Literacy: Adequate Health Literacy (02/09/2024)   B1300 Health Literacy    Frequency of need for help with medical instructions: Never    Objective:  BP 128/82   Pulse 88   Temp 98.2 F (36.8 C)   Ht 5' 8 (1.727 m)   Wt 270 lb (122.5 kg)   SpO2 92%   BMI 41.05 kg/m      02/09/2024    3:55 PM 02/09/2024    3:25 PM 01/12/2024    3:17 PM  BP/Weight  Systolic BP 128 128 136  Diastolic BP 82 82 88  Wt. (Lbs) 270 270 275  BMI 41.05 kg/m2 41.05 kg/m2 41.81 kg/m2    Physical Exam Vitals reviewed.  Constitutional:      Appearance: Normal appearance. She is obese.  Neck:     Vascular: No carotid bruit.  Cardiovascular:     Rate and Rhythm: Normal rate and regular rhythm.     Heart sounds: Normal heart sounds.  Pulmonary:     Effort: Pulmonary effort is normal. No respiratory distress.     Breath sounds: Normal breath sounds.  Neurological:     Mental Status: She is alert and oriented to person, place, and time.  Psychiatric:        Mood and Affect: Mood normal.        Behavior: Behavior normal.     {Perform Simple Foot Exam  Perform Detailed exam:1} {Insert foot Exam (Optional):30965}   Lab Results  Component Value Date   WBC 9.1 09/01/2023   HGB 14.0 09/01/2023  HCT 43.0 09/01/2023   PLT 225 09/01/2023   GLUCOSE 77 12/17/2023   CHOL 189 09/01/2023   TRIG 182 (H) 09/01/2023   HDL 38 (L) 09/01/2023   LDLCALC 119 (H) 09/01/2023   ALT 14 12/17/2023   AST 23 12/17/2023   NA 131 (L) 12/17/2023   K 3.7 12/17/2023   CL 91 (L) 12/17/2023   CREATININE 0.73 12/17/2023   BUN 17 12/17/2023   CO2 27 12/17/2023   TSH 4.390 12/17/2023   HGBA1C 6.0 12/17/2023    Results for orders placed or performed in visit on 12/17/23  POCT  glycosylated hemoglobin (Hb A1C)   Collection Time: 12/17/23 10:31 AM  Result Value Ref Range   Hemoglobin A1C     HbA1c POC (<> result, manual entry) 6.0 4.0 - 5.6 %   HbA1c, POC (prediabetic range)     HbA1c, POC (controlled diabetic range)    POCT Lipid Panel   Collection Time: 12/17/23 10:33 AM  Result Value Ref Range   TC 170    HDL 56    TRG 85    LDL 97    Non-HDL 114    TC/HDL    Comprehensive metabolic panel with GFR   Collection Time: 12/17/23 11:25 AM  Result Value Ref Range   Glucose 77 70 - 99 mg/dL   BUN 17 8 - 27 mg/dL   Creatinine, Ser 9.26 0.57 - 1.00 mg/dL   eGFR 87 >40 fO/fpw/8.26   BUN/Creatinine Ratio 23 12 - 28   Sodium 131 (L) 134 - 144 mmol/L   Potassium 3.7 3.5 - 5.2 mmol/L   Chloride 91 (L) 96 - 106 mmol/L   CO2 27 20 - 29 mmol/L   Calcium  9.7 8.7 - 10.3 mg/dL   Total Protein 7.2 6.0 - 8.5 g/dL   Albumin 4.3 3.8 - 4.8 g/dL   Globulin, Total 2.9 1.5 - 4.5 g/dL   Bilirubin Total 0.6 0.0 - 1.2 mg/dL   Alkaline Phosphatase 88 49 - 135 IU/L   AST 23 0 - 40 IU/L   ALT 14 0 - 32 IU/L  T4, free   Collection Time: 12/17/23 11:25 AM  Result Value Ref Range   Free T4 1.65 0.82 - 1.77 ng/dL  TSH   Collection Time: 12/17/23 11:25 AM  Result Value Ref Range   TSH 4.390 0.450 - 4.500 uIU/mL  VITAMIN D  25 Hydroxy (Vit-D Deficiency, Fractures)   Collection Time: 12/17/23 11:25 AM  Result Value Ref Range   Vit D, 25-Hydroxy 44.7 30.0 - 100.0 ng/mL  .  Assessment & Plan:   Assessment & Plan Type 2 diabetes mellitus with stage 2 chronic kidney disease, with long-term current use of insulin  (HCC) Type 2 diabetes mellitus with hypoglycemia Hypoglycemic episodes with unreliable Freestyle Libre sensor. Improved glucose control with insulin  dose reduction.  - Provided Dexcom G7 sensor samples for trial. - Instructed on Dexcom app setup. - Arranged for Katie to assist with setup. - Sent Dexcom G7 sensors to [facility] for future use.    Class 3 severe obesity  due to excess calories with serious comorbidity and body mass index (BMI) of 40.0 to 44.9 in adult (HCC) Weight decreased from 280 to 270 pounds with dietary changes.     Body mass index is 41.05 kg/m.    No orders of the defined types were placed in this encounter.   No orders of the defined types were placed in this encounter.    I,Marla I Leal-Borjas,acting as a  scribe for Abigail Free, MD.,have documented all relevant documentation on the behalf of Abigail Free, MD,as directed by  Abigail Free, MD while in the presence of Abigail Free, MD.    Follow-up: No follow-ups on file.  An After Visit Summary was printed and given to the patient.  Abigail Free, MD [facility] [phone]

## 2024-02-12 NOTE — Assessment & Plan Note (Signed)
 Type 2 diabetes mellitus with hypoglycemia Hypoglycemic episodes with unreliable Freestyle Libre sensor. Improved glucose control with insulin  dose reduction.  - Provided Dexcom G7 sensor samples for trial. - Instructed on Dexcom app setup. - Arranged for Katie to assist with setup. - Sent Dexcom G7 sensors to [facility] for future use.

## 2024-02-12 NOTE — Assessment & Plan Note (Signed)
 Weight decreased from 280 to 270 pounds with dietary changes. Recommend continue to work on eating healthy diet and exercise.

## 2024-02-13 ENCOUNTER — Encounter: Payer: Self-pay | Admitting: Family Medicine

## 2024-02-13 MED ORDER — DEXCOM G7 SENSOR MISC: 3 refills | Status: DC

## 2024-02-15 ENCOUNTER — Ambulatory Visit

## 2024-02-17 ENCOUNTER — Other Ambulatory Visit: Payer: Self-pay

## 2024-02-17 MED ORDER — DEXCOM G7 SENSOR MISC: 3 refills | Status: AC

## 2024-02-17 NOTE — Telephone Encounter (Signed)
 Rx resent with dx code

## 2024-02-24 ENCOUNTER — Other Ambulatory Visit: Payer: Self-pay | Admitting: Family Medicine

## 2024-02-24 DIAGNOSIS — E1159 Type 2 diabetes mellitus with other circulatory complications: Secondary | ICD-10-CM

## 2024-02-27 ENCOUNTER — Other Ambulatory Visit: Payer: Self-pay | Admitting: Family Medicine

## 2024-02-27 DIAGNOSIS — J4520 Mild intermittent asthma, uncomplicated: Secondary | ICD-10-CM

## 2024-03-15 ENCOUNTER — Other Ambulatory Visit: Payer: Self-pay | Admitting: Family Medicine

## 2024-03-24 ENCOUNTER — Ambulatory Visit: Admitting: Family Medicine

## 2024-03-24 ENCOUNTER — Other Ambulatory Visit: Payer: Self-pay | Admitting: Family Medicine

## 2024-03-24 DIAGNOSIS — I152 Hypertension secondary to endocrine disorders: Secondary | ICD-10-CM

## 2024-03-26 ENCOUNTER — Other Ambulatory Visit: Payer: Self-pay | Admitting: Family Medicine

## 2024-03-26 DIAGNOSIS — E1169 Type 2 diabetes mellitus with other specified complication: Secondary | ICD-10-CM

## 2024-04-03 ENCOUNTER — Ambulatory Visit: Admitting: Family Medicine

## 2024-06-07 ENCOUNTER — Ambulatory Visit: Admitting: Family Medicine
